# Patient Record
Sex: Female | Born: 1945 | Race: White | Hispanic: No | Marital: Married | Smoking: Never smoker
Health system: Southern US, Community
[De-identification: ages and names within clinical notes are randomized; demographics above are authoritative.]

## PROBLEM LIST (undated history)

## (undated) DIAGNOSIS — I10 Essential (primary) hypertension: Secondary | ICD-10-CM

## (undated) DIAGNOSIS — F419 Anxiety disorder, unspecified: Secondary | ICD-10-CM

## (undated) DIAGNOSIS — M179 Osteoarthritis of knee, unspecified: Secondary | ICD-10-CM

## (undated) DIAGNOSIS — G43109 Migraine with aura, not intractable, without status migrainosus: Secondary | ICD-10-CM

## (undated) DIAGNOSIS — E039 Hypothyroidism, unspecified: Secondary | ICD-10-CM

## (undated) DIAGNOSIS — N183 Chronic kidney disease, stage 3 unspecified: Secondary | ICD-10-CM

## (undated) DIAGNOSIS — K219 Gastro-esophageal reflux disease without esophagitis: Secondary | ICD-10-CM

## (undated) DIAGNOSIS — M719 Bursopathy, unspecified: Secondary | ICD-10-CM

## (undated) DIAGNOSIS — R001 Bradycardia, unspecified: Secondary | ICD-10-CM

## (undated) DIAGNOSIS — F32A Depression, unspecified: Secondary | ICD-10-CM

## (undated) DIAGNOSIS — E079 Disorder of thyroid, unspecified: Secondary | ICD-10-CM

## (undated) DIAGNOSIS — E782 Mixed hyperlipidemia: Secondary | ICD-10-CM

## (undated) DIAGNOSIS — K449 Diaphragmatic hernia without obstruction or gangrene: Secondary | ICD-10-CM

## (undated) DIAGNOSIS — T7840XA Allergy, unspecified, initial encounter: Secondary | ICD-10-CM

## (undated) DIAGNOSIS — K5790 Diverticulosis of intestine, part unspecified, without perforation or abscess without bleeding: Secondary | ICD-10-CM

## (undated) DIAGNOSIS — M1710 Unilateral primary osteoarthritis, unspecified knee: Secondary | ICD-10-CM

## (undated) HISTORY — DX: Diverticulosis of intestine, part unspecified, without perforation or abscess without bleeding: K57.90

## (undated) HISTORY — DX: Migraine with aura, not intractable, without status migrainosus: G43.109

## (undated) HISTORY — DX: Gastro-esophageal reflux disease without esophagitis: K21.9

## (undated) HISTORY — DX: Mixed hyperlipidemia: E78.2

## (undated) HISTORY — DX: Osteoarthritis of knee, unspecified: M17.9

## (undated) HISTORY — DX: Chronic kidney disease, stage 3 unspecified: N18.30

## (undated) HISTORY — PX: TONSILLECTOMY: SUR1361

## (undated) HISTORY — PX: CHOLECYSTECTOMY: SHX55

## (undated) HISTORY — DX: Disorder of thyroid, unspecified: E07.9

## (undated) HISTORY — PX: ESOPHAGEAL DILATION: SHX303

## (undated) HISTORY — DX: Allergy, unspecified, initial encounter: T78.40XA

## (undated) HISTORY — DX: Depression, unspecified: F32.A

## (undated) HISTORY — DX: Diaphragmatic hernia without obstruction or gangrene: K44.9

## (undated) HISTORY — DX: Anxiety disorder, unspecified: F41.9

## (undated) HISTORY — DX: Essential (primary) hypertension: I10

## (undated) HISTORY — DX: Unilateral primary osteoarthritis, unspecified knee: M17.10

## (undated) HISTORY — DX: Bursopathy, unspecified: M71.9

---

## 1973-05-16 HISTORY — PX: TUBAL LIGATION: SHX77

## 1998-05-16 DIAGNOSIS — D649 Anemia, unspecified: Secondary | ICD-10-CM

## 1998-05-16 HISTORY — DX: Anemia, unspecified: D64.9

## 2004-03-11 ENCOUNTER — Ambulatory Visit: Payer: Self-pay | Admitting: Family Medicine

## 2004-06-17 ENCOUNTER — Ambulatory Visit: Payer: Self-pay | Admitting: Gastroenterology

## 2005-08-15 ENCOUNTER — Ambulatory Visit: Payer: Self-pay | Admitting: Family Medicine

## 2005-08-23 ENCOUNTER — Ambulatory Visit: Payer: Self-pay | Admitting: Family Medicine

## 2006-06-26 ENCOUNTER — Ambulatory Visit: Payer: Self-pay | Admitting: Gastroenterology

## 2006-09-25 ENCOUNTER — Ambulatory Visit: Payer: Self-pay | Admitting: Family Medicine

## 2007-09-05 ENCOUNTER — Ambulatory Visit: Payer: Self-pay | Admitting: Family Medicine

## 2007-09-27 ENCOUNTER — Ambulatory Visit: Payer: Self-pay | Admitting: Family Medicine

## 2008-10-02 ENCOUNTER — Ambulatory Visit: Payer: Self-pay | Admitting: Family Medicine

## 2009-05-04 ENCOUNTER — Ambulatory Visit: Payer: Self-pay | Admitting: Family Medicine

## 2009-09-10 ENCOUNTER — Ambulatory Visit: Payer: Self-pay | Admitting: Family Medicine

## 2009-09-15 ENCOUNTER — Ambulatory Visit: Payer: Self-pay | Admitting: Gastroenterology

## 2011-05-03 ENCOUNTER — Ambulatory Visit: Payer: Self-pay | Admitting: Family Medicine

## 2011-05-26 ENCOUNTER — Ambulatory Visit: Payer: Self-pay | Admitting: Family Medicine

## 2011-06-03 ENCOUNTER — Ambulatory Visit: Payer: Self-pay | Admitting: Surgery

## 2011-06-03 LAB — COMPREHENSIVE METABOLIC PANEL
Albumin: 3.5 g/dL (ref 3.4–5.0)
Alkaline Phosphatase: 55 U/L (ref 50–136)
Anion Gap: 9 (ref 7–16)
BUN: 13 mg/dL (ref 7–18)
Bilirubin,Total: 0.4 mg/dL (ref 0.2–1.0)
Calcium, Total: 9 mg/dL (ref 8.5–10.1)
Chloride: 104 mmol/L (ref 98–107)
Co2: 29 mmol/L (ref 21–32)
Creatinine: 1.16 mg/dL (ref 0.60–1.30)
EGFR (African American): >60
EGFR (Non-African Amer.): 50 — ABNORMAL LOW
Glucose: 112 mg/dL — ABNORMAL HIGH (ref 65–99)
Osmolality: 284 (ref 275–301)
Potassium: 4.4 mmol/L (ref 3.5–5.1)
SGOT(AST): 18 U/L (ref 15–37)
SGPT (ALT): 19 U/L
Sodium: 142 mmol/L (ref 136–145)
Total Protein: 7.9 g/dL (ref 6.4–8.2)

## 2011-06-03 LAB — CBC WITH DIFFERENTIAL/PLATELET
Basophil #: 0 10*3/uL (ref 0.0–0.1)
Basophil %: 0.4 %
Eosinophil #: 0.1 10*3/uL (ref 0.0–0.7)
Eosinophil %: 1.8 %
HCT: 40.8 % (ref 35.0–47.0)
HGB: 13.6 g/dL (ref 12.0–16.0)
Lymphocyte #: 1.2 10*3/uL (ref 1.0–3.6)
Lymphocyte %: 21.4 %
MCH: 30.7 pg (ref 26.0–34.0)
MCHC: 33.2 g/dL (ref 32.0–36.0)
MCV: 92 fL (ref 80–100)
Monocyte #: 0.6 10*3/uL (ref 0.0–0.7)
Monocyte %: 10.4 %
Neutrophil #: 3.7 10*3/uL (ref 1.4–6.5)
Neutrophil %: 66 %
Platelet: 216 10*3/uL (ref 150–440)
RBC: 4.42 10*6/uL (ref 3.80–5.20)
RDW: 13.3 % (ref 11.5–14.5)
WBC: 5.6 10*3/uL (ref 3.6–11.0)

## 2011-06-10 ENCOUNTER — Ambulatory Visit: Payer: Self-pay | Admitting: Surgery

## 2011-06-13 LAB — PATHOLOGY REPORT

## 2011-12-19 ENCOUNTER — Ambulatory Visit: Payer: Self-pay | Admitting: Family Medicine

## 2011-12-29 ENCOUNTER — Ambulatory Visit: Payer: Self-pay | Admitting: Family Medicine

## 2012-12-25 ENCOUNTER — Ambulatory Visit: Payer: Self-pay | Admitting: Family Medicine

## 2013-08-21 DIAGNOSIS — B351 Tinea unguium: Secondary | ICD-10-CM | POA: Insufficient documentation

## 2013-09-19 DIAGNOSIS — E041 Nontoxic single thyroid nodule: Secondary | ICD-10-CM | POA: Insufficient documentation

## 2013-10-25 DIAGNOSIS — G459 Transient cerebral ischemic attack, unspecified: Secondary | ICD-10-CM

## 2013-10-25 DIAGNOSIS — H539 Unspecified visual disturbance: Secondary | ICD-10-CM | POA: Insufficient documentation

## 2013-10-25 DIAGNOSIS — R200 Anesthesia of skin: Secondary | ICD-10-CM | POA: Insufficient documentation

## 2013-10-25 DIAGNOSIS — R6889 Other general symptoms and signs: Secondary | ICD-10-CM

## 2013-10-25 DIAGNOSIS — R202 Paresthesia of skin: Secondary | ICD-10-CM | POA: Insufficient documentation

## 2013-10-25 DIAGNOSIS — R51 Headache: Secondary | ICD-10-CM

## 2013-10-25 DIAGNOSIS — R519 Headache, unspecified: Secondary | ICD-10-CM | POA: Insufficient documentation

## 2013-10-25 DIAGNOSIS — I10 Essential (primary) hypertension: Secondary | ICD-10-CM | POA: Insufficient documentation

## 2013-10-25 DIAGNOSIS — E669 Obesity, unspecified: Secondary | ICD-10-CM | POA: Insufficient documentation

## 2013-12-05 ENCOUNTER — Emergency Department: Payer: Self-pay | Admitting: Emergency Medicine

## 2013-12-05 LAB — BASIC METABOLIC PANEL
Anion Gap: 4 — ABNORMAL LOW (ref 7–16)
BUN: 10 mg/dL (ref 7–18)
Calcium, Total: 9.1 mg/dL (ref 8.5–10.1)
Chloride: 104 mmol/L (ref 98–107)
Co2: 30 mmol/L (ref 21–32)
Creatinine: 1.06 mg/dL (ref 0.60–1.30)
EGFR (African American): >60
EGFR (Non-African Amer.): 54 — ABNORMAL LOW
Glucose: 102 mg/dL — ABNORMAL HIGH (ref 65–99)
Osmolality: 275 (ref 275–301)
Potassium: 3.8 mmol/L (ref 3.5–5.1)
Sodium: 138 mmol/L (ref 136–145)

## 2013-12-05 LAB — CBC
HCT: 40.9 % (ref 35.0–47.0)
HGB: 13.5 g/dL (ref 12.0–16.0)
MCH: 31.2 pg (ref 26.0–34.0)
MCHC: 33 g/dL (ref 32.0–36.0)
MCV: 95 fL (ref 80–100)
Platelet: 219 10*3/uL (ref 150–440)
RBC: 4.33 10*6/uL (ref 3.80–5.20)
RDW: 13.8 % (ref 11.5–14.5)
WBC: 10.3 10*3/uL (ref 3.6–11.0)

## 2013-12-05 LAB — PRO B NATRIURETIC PEPTIDE: B-Type Natriuretic Peptide: 203 pg/mL — ABNORMAL HIGH (ref 0–125)

## 2013-12-05 LAB — TROPONIN I: Troponin-I: <0.02 ng/mL

## 2014-01-09 ENCOUNTER — Ambulatory Visit: Payer: Self-pay

## 2014-01-14 ENCOUNTER — Ambulatory Visit: Payer: Self-pay | Admitting: Family Medicine

## 2014-06-02 ENCOUNTER — Ambulatory Visit: Payer: Self-pay | Admitting: Gastroenterology

## 2014-06-02 HISTORY — PX: COLONOSCOPY: SHX174

## 2014-06-02 HISTORY — PX: UPPER GI ENDOSCOPY: SHX6162

## 2014-06-10 ENCOUNTER — Ambulatory Visit: Payer: Self-pay | Admitting: Gastroenterology

## 2014-06-26 ENCOUNTER — Ambulatory Visit: Payer: Self-pay | Admitting: Surgery

## 2014-07-09 ENCOUNTER — Ambulatory Visit: Admit: 2014-07-09 | Disposition: A | Discharge status: Home / Self Care | Payer: Self-pay | Admitting: Gastroenterology

## 2014-08-26 ENCOUNTER — Ambulatory Visit
Admit: 2014-08-26 | Disposition: A | Discharge status: Home / Self Care | Payer: Self-pay | Attending: Family Medicine | Admitting: Family Medicine

## 2014-08-28 DIAGNOSIS — K449 Diaphragmatic hernia without obstruction or gangrene: Secondary | ICD-10-CM | POA: Insufficient documentation

## 2014-08-28 DIAGNOSIS — E7800 Pure hypercholesterolemia, unspecified: Secondary | ICD-10-CM

## 2014-08-28 DIAGNOSIS — Z8673 Personal history of transient ischemic attack (TIA), and cerebral infarction without residual deficits: Secondary | ICD-10-CM | POA: Insufficient documentation

## 2014-08-28 DIAGNOSIS — E039 Hypothyroidism, unspecified: Secondary | ICD-10-CM | POA: Insufficient documentation

## 2014-08-28 HISTORY — PX: HERNIA REPAIR: SHX51

## 2014-09-07 NOTE — Op Note (Signed)
PATIENT NAME:  Pamela Hendrix, KLUGE MR#:  [id number] DATE OF BIRTH:  May 11, 1946  DATE OF PROCEDURE:  06/10/2011  OPERATION PERFORMED: Laparoscopic cholecystectomy.  PREOPERATIVE DIAGNOSIS: Symptomatic cholelithiasis.   POSTOPERATIVE DIAGNOSIS: Symptomatic cholelithiasis.  SURGEON: Consuela Mimes, MD   ANESTHESIA: General.   PROCEDURE IN DETAIL: The patient was placed supine on the operating room table and prepped and draped in the usual sterile fashion. A 15 mmHg CO2 pneumoperitoneum was created via a Veress needle in the infraumbilical position and this was replaced with a 5 mm trocar and a 30 degree angled laparoscope. Remaining trocars were placed under direct visualization. The fundus of the gallbladder was retracted superiorly and eventually the infundibulum and triangle of Calot were shrouded in fat. The infundibulum was dissected out and retracted laterally to open the triangle up and dissection within the triangle revealed a clear cystic artery which was doubly clipped and divided. This was a posterior cystic artery. Prior to that, a small anterior cystic artery had been doubly clipped and divided. The cystic duct was then seen joining the infundibulum of the gallbladder and a single clip was placed here and three other clips were placed more centrally (as one of the clips scissored) and the cystic duct was divided. The gallbladder was removed from the liver bed with electrocautery, placed in an EndoCatch bag, and extracted from the abdomen via the epigastric port. This port site fascia required some enlargement due to the volume of stones and the peritoneum was temporarily desufflated and the linea alba at the epigastric port site was closed with a running 0 PDS suture. The abdomen was reinsufflated and repeat inspection showed that hemostasis was excellent and there was no bile staining. The clips were all secure. The right upper quadrant was irrigated with warm normal saline. This was  suctioned clear. The peritoneum was desufflated finally and decannulated and all four skin sites were closed with subcuticular 5-0 Monocryl, Mastisol, and Steri-Strips. The patient tolerated the procedure well. There were no complications.  ____________________________ Consuela Mimes, MD wfm:[id number] D: 06/10/2011 10:59:16 ET T: 06/10/2011 11:20:04 ET JOB#: [id number] Consuela Mimes MD ELECTRONICALLY SIGNED 06/11/2011 10:55

## 2014-09-08 LAB — SURGICAL PATHOLOGY

## 2014-12-04 ENCOUNTER — Other Ambulatory Visit: Payer: Self-pay | Admitting: Family Medicine

## 2015-04-02 ENCOUNTER — Ambulatory Visit (INDEPENDENT_AMBULATORY_CARE_PROVIDER_SITE_OTHER): Payer: Medicare PPO

## 2015-04-02 DIAGNOSIS — Z23 Encounter for immunization: Secondary | ICD-10-CM | POA: Diagnosis not present

## 2015-05-21 DIAGNOSIS — E038 Other specified hypothyroidism: Secondary | ICD-10-CM | POA: Diagnosis not present

## 2015-05-21 DIAGNOSIS — E041 Nontoxic single thyroid nodule: Secondary | ICD-10-CM | POA: Diagnosis not present

## 2015-05-21 DIAGNOSIS — E063 Autoimmune thyroiditis: Secondary | ICD-10-CM | POA: Diagnosis not present

## 2015-06-11 ENCOUNTER — Other Ambulatory Visit: Payer: Self-pay

## 2015-06-19 ENCOUNTER — Encounter: Payer: Self-pay | Admitting: Family Medicine

## 2015-06-19 ENCOUNTER — Ambulatory Visit (INDEPENDENT_AMBULATORY_CARE_PROVIDER_SITE_OTHER): Payer: Medicare HMO | Admitting: Family Medicine

## 2015-06-19 VITALS — BP 110/80 | HR 60 | Ht 63.0 in | Wt 156.0 lb

## 2015-06-19 DIAGNOSIS — E039 Hypothyroidism, unspecified: Secondary | ICD-10-CM

## 2015-06-19 DIAGNOSIS — I679 Cerebrovascular disease, unspecified: Secondary | ICD-10-CM | POA: Diagnosis not present

## 2015-06-19 DIAGNOSIS — Z23 Encounter for immunization: Secondary | ICD-10-CM | POA: Diagnosis not present

## 2015-06-19 DIAGNOSIS — E7800 Pure hypercholesterolemia, unspecified: Secondary | ICD-10-CM

## 2015-06-19 DIAGNOSIS — J309 Allergic rhinitis, unspecified: Secondary | ICD-10-CM

## 2015-06-19 DIAGNOSIS — I10 Essential (primary) hypertension: Secondary | ICD-10-CM

## 2015-06-19 MED ORDER — LEVOTHYROXINE SODIUM 100 MCG PO TABS: 100.0000 ug | ORAL_TABLET | Freq: Every day | ORAL | Status: DC

## 2015-06-19 MED ORDER — FLUTICASONE PROPIONATE 50 MCG/ACT NA SUSP: 1.0000 | Freq: Every day | NASAL | Status: DC

## 2015-06-19 MED ORDER — LOVASTATIN 20 MG PO TABS: 20.0000 mg | ORAL_TABLET | Freq: Every day | ORAL | Status: DC

## 2015-06-19 MED ORDER — MONTELUKAST SODIUM 10 MG PO TABS: 10.0000 mg | ORAL_TABLET | Freq: Every day | ORAL | Status: DC

## 2015-06-19 NOTE — Progress Notes (Signed)
Name: Pamela Hendrix   MRN: [id number]    DOB: 1946-03-02   Date:06/19/2015       Progress Note  Subjective  Chief Complaint  Chief Complaint  Patient presents with  . Hyperlipidemia  . Hypothyroidism  . Allergic Rhinitis   . Asthma    Hyperlipidemia This is a chronic problem. The current episode started more than 1 year ago. The problem is controlled. Recent lipid tests were reviewed and are normal. She has no history of chronic renal disease, diabetes, hypothyroidism, liver disease, obesity or nephrotic syndrome. There are no known factors aggravating her hyperlipidemia. Pertinent negatives include no chest pain, focal sensory loss, focal weakness, leg pain, myalgias or shortness of breath. Current antihyperlipidemic treatment includes statins. The current treatment provides mild improvement of lipids. There are no compliance problems.  Risk factors for coronary artery disease include dyslipidemia and hypertension.  Asthma There is no chest tightness, cough, difficulty breathing, frequent throat clearing, hemoptysis, hoarse voice, shortness of breath, sputum production or wheezing. This is a chronic problem. The current episode started more than 1 year ago. The problem occurs intermittently. The problem has been waxing and waning. Associated symptoms include weight loss. Pertinent negatives include no appetite change, chest pain, dyspnea on exertion, ear congestion, ear pain, fever, headaches, heartburn, malaise/fatigue, myalgias, nasal congestion, orthopnea, postnasal drip, rhinorrhea, sneezing, sore throat, sweats or trouble swallowing. Her symptoms are aggravated by nothing. Her symptoms are alleviated by leukotriene antagonist. She reports moderate improvement on treatment. Her past medical history is significant for asthma and bronchitis. There is no history of bronchiectasis or COPD.  Thyroid Problem Presents for follow-up visit. Symptoms include cold intolerance, constipation, dry  skin, fatigue and weight loss. Patient reports no anxiety, depressed mood, diaphoresis, diarrhea, hair loss, heat intolerance, hoarse voice, palpitations or visual change. The symptoms have been stable. Past treatments include levothyroxine. The treatment provided moderate relief. Her past medical history is significant for hyperlipidemia. There is no history of atrial fibrillation, dementia, diabetes, Graves' ophthalmopathy or neuropathy.    No problem-specific assessment & plan notes found for this encounter.   Past Medical History  Diagnosis Date  . Hypertension   . Thyroid disease   . GERD (gastroesophageal reflux disease)   . Allergy     Past Surgical History  Procedure Laterality Date  . Esophageal dilation    . Cholecystectomy    . Tonsillectomy    . Colonoscopy  06/02/2014    cleared for 10 yrs- Dr Rayann Heman  . Upper gi endoscopy  06/02/2014    History reviewed. No pertinent family history.  Social History   Social History  . Marital Status: Married    Spouse Name: N/A  . Number of Children: N/A  . Years of Education: N/A   Occupational History  . Not on file.   Social History Main Topics  . Smoking status: Never Smoker   . Smokeless tobacco: Not on file  . Alcohol Use: No  . Drug Use: No  . Sexual Activity: No   Other Topics Concern  . Not on file   Social History Narrative  . No narrative on file    Allergies  Allergen Reactions  . Codeine Nausea Only     Review of Systems  Constitutional: Positive for weight loss and fatigue. Negative for fever, chills, malaise/fatigue, diaphoresis and appetite change.  HENT: Negative for ear discharge, ear pain, hoarse voice, postnasal drip, rhinorrhea, sneezing, sore throat and trouble swallowing.   Eyes: Negative for blurred vision.  Respiratory: Negative for cough, hemoptysis, sputum production, shortness of breath and wheezing.   Cardiovascular: Negative for chest pain, dyspnea on exertion, palpitations and leg  swelling.  Gastrointestinal: Positive for constipation. Negative for heartburn, nausea, abdominal pain, diarrhea, blood in stool and melena.  Genitourinary: Negative for dysuria, urgency, frequency and hematuria.  Musculoskeletal: Negative for myalgias, back pain, joint pain and neck pain.  Skin: Negative for rash.  Neurological: Negative for dizziness, tingling, sensory change, focal weakness and headaches.  Endo/Heme/Allergies: Positive for cold intolerance. Negative for environmental allergies, heat intolerance and polydipsia. Does not bruise/bleed easily.  Psychiatric/Behavioral: Negative for depression and suicidal ideas. The patient is not nervous/anxious and does not have insomnia.      Objective  Filed Vitals:   06/19/15 0906  BP: 110/80  Pulse: 60  Height: 5\' 3"  (1.6 m)  Weight: 156 lb (70.761 kg)    Physical Exam  Constitutional: She is well-developed, well-nourished, and in no distress. No distress.  HENT:  Head: Normocephalic and atraumatic.  Right Ear: External ear normal.  Left Ear: External ear normal.  Nose: Nose normal.  Mouth/Throat: Oropharynx is clear and moist.  Eyes: Conjunctivae and EOM are normal. Pupils are equal, round, and reactive to light. Right eye exhibits no discharge. Left eye exhibits no discharge.  Neck: Normal range of motion. Neck supple. No JVD present. No thyromegaly present.  Cardiovascular: Normal rate, regular rhythm, normal heart sounds and intact distal pulses.  Exam reveals no gallop and no friction rub.   No murmur heard. Pulmonary/Chest: Effort normal and breath sounds normal.  Abdominal: Soft. Bowel sounds are normal. She exhibits no mass. There is no tenderness. There is no guarding.  Musculoskeletal: Normal range of motion. She exhibits no edema.  Lymphadenopathy:    She has no cervical adenopathy.  Neurological: She is alert.  Skin: Skin is warm and dry. She is not diaphoretic.  Psychiatric: Mood and affect normal.  Nursing  note and vitals reviewed.     Assessment & Plan  Problem List Items Addressed This Visit      Cardiovascular and Mediastinum   BP (high blood pressure) - Primary   Relevant Medications   aspirin EC 81 MG tablet   lovastatin (MEVACOR) 20 MG tablet   Other Relevant Orders   Renal Function Panel     Endocrine   Adult hypothyroidism   Relevant Medications   levothyroxine (SYNTHROID, LEVOTHROID) 100 MCG tablet   Other Relevant Orders   TSH     Other   Hypercholesterolemia   Relevant Medications   aspirin EC 81 MG tablet   lovastatin (MEVACOR) 20 MG tablet   Other Relevant Orders   Lipid Profile    Other Visit Diagnoses    Cerebral vascular disease        Relevant Medications    aspirin EC 81 MG tablet    lovastatin (MEVACOR) 20 MG tablet    Allergic sinusitis        Relevant Medications    cetirizine (ZYRTEC) 10 MG tablet    fluticasone (FLONASE) 50 MCG/ACT nasal spray    Need for pneumococcal vaccination        Relevant Orders    Pneumococcal conjugate vaccine 13-valent (Completed)         Dr. Dusten Ellinwood [facility]  06/19/2015

## 2015-06-20 LAB — RENAL FUNCTION PANEL
Albumin: 4.1 g/dL (ref 3.6–4.8)
BUN/Creatinine Ratio: 11 (ref 11–26)
BUN: 11 mg/dL (ref 8–27)
CO2: 26 mmol/L (ref 18–29)
Calcium: 9.4 mg/dL (ref 8.7–10.3)
Chloride: 102 mmol/L (ref 96–106)
Creatinine, Ser: 1.02 mg/dL — ABNORMAL HIGH (ref 0.57–1.00)
GFR calc Af Amer: 65 mL/min/{1.73_m2} (ref >59)
GFR calc non Af Amer: 56 mL/min/{1.73_m2} — ABNORMAL LOW (ref >59)
Glucose: 90 mg/dL (ref 65–99)
Phosphorus: 4.4 mg/dL (ref 2.5–4.5)
Potassium: 4.7 mmol/L (ref 3.5–5.2)
Sodium: 144 mmol/L (ref 134–144)

## 2015-06-20 LAB — TSH: TSH: 0.47 u[IU]/mL (ref 0.450–4.500)

## 2015-06-20 LAB — LIPID PANEL
Chol/HDL Ratio: 2.5 ratio units (ref 0.0–4.4)
Cholesterol, Total: 138 mg/dL (ref 100–199)
HDL: 56 mg/dL (ref >39)
LDL Calculated: 57 mg/dL (ref 0–99)
Triglycerides: 126 mg/dL (ref 0–149)
VLDL Cholesterol Cal: 25 mg/dL (ref 5–40)

## 2015-07-09 DIAGNOSIS — E039 Hypothyroidism, unspecified: Secondary | ICD-10-CM | POA: Diagnosis not present

## 2015-07-09 DIAGNOSIS — E785 Hyperlipidemia, unspecified: Secondary | ICD-10-CM | POA: Diagnosis not present

## 2015-08-27 ENCOUNTER — Other Ambulatory Visit: Payer: Self-pay

## 2015-08-27 MED ORDER — LOVASTATIN 20 MG PO TABS: 20.0000 mg | ORAL_TABLET | Freq: Every day | ORAL | Status: DC

## 2015-09-07 ENCOUNTER — Ambulatory Visit (INDEPENDENT_AMBULATORY_CARE_PROVIDER_SITE_OTHER): Payer: Medicare HMO | Admitting: Family Medicine

## 2015-09-07 ENCOUNTER — Encounter: Payer: Self-pay | Admitting: Family Medicine

## 2015-09-07 VITALS — BP 130/70 | HR 78 | Ht 63.0 in | Wt 156.0 lb

## 2015-09-07 DIAGNOSIS — E785 Hyperlipidemia, unspecified: Secondary | ICD-10-CM | POA: Diagnosis not present

## 2015-09-07 DIAGNOSIS — Z1239 Encounter for other screening for malignant neoplasm of breast: Secondary | ICD-10-CM | POA: Diagnosis not present

## 2015-09-07 DIAGNOSIS — Z0000 Encounter for general adult medical examination without abnormal findings: Secondary | ICD-10-CM | POA: Diagnosis not present

## 2015-09-07 DIAGNOSIS — E039 Hypothyroidism, unspecified: Secondary | ICD-10-CM

## 2015-09-07 DIAGNOSIS — Z13820 Encounter for screening for osteoporosis: Secondary | ICD-10-CM

## 2015-09-07 LAB — HEMOCCULT GUIAC POC 1CARD (OFFICE): Fecal Occult Blood, POC: NEGATIVE

## 2015-09-07 LAB — POCT URINALYSIS DIPSTICK
Bilirubin, UA: NEGATIVE
Blood, UA: NEGATIVE
Glucose, UA: NEGATIVE
Ketones, UA: NEGATIVE
Leukocytes, UA: NEGATIVE
Nitrite, UA: NEGATIVE
Protein, UA: NEGATIVE
Spec Grav, UA: 1.02
Urobilinogen, UA: 0.2
pH, UA: 5

## 2015-09-07 NOTE — Progress Notes (Signed)
Name: Pamela Hendrix   MRN: [id number]    DOB: Sep 23, 1945   Date:09/07/2015       Progress Note  Subjective  Chief Complaint  Chief Complaint  Patient presents with  . Annual Exam    mammo ordered and bone density    HPI Comments: Patient presents for annual physical exam.   No problem-specific assessment & plan notes found for this encounter.   Past Medical History  Diagnosis Date  . Hypertension   . Thyroid disease   . GERD (gastroesophageal reflux disease)   . Allergy     Past Surgical History  Procedure Laterality Date  . Esophageal dilation    . Cholecystectomy    . Tonsillectomy    . Colonoscopy  06/02/2014    cleared for 10 yrs- Dr Rayann Heman  . Upper gi endoscopy  06/02/2014    History reviewed. No pertinent family history.  Social History   Social History  . Marital Status: Married    Spouse Name: N/A  . Number of Children: N/A  . Years of Education: N/A   Occupational History  . Not on file.   Social History Main Topics  . Smoking status: Never Smoker   . Smokeless tobacco: Not on file  . Alcohol Use: No  . Drug Use: No  . Sexual Activity: No   Other Topics Concern  . Not on file   Social History Narrative    Allergies  Allergen Reactions  . Codeine Nausea Only     Review of Systems  Constitutional: Negative for fever, chills, weight loss and malaise/fatigue.  HENT: Negative for ear discharge, ear pain and sore throat.   Eyes: Positive for redness. Negative for blurred vision.  Respiratory: Negative for cough, sputum production, shortness of breath and wheezing.   Cardiovascular: Negative for chest pain, palpitations and leg swelling.  Gastrointestinal: Negative for heartburn, nausea, abdominal pain, diarrhea, constipation, blood in stool and melena.       Mild dysphagia/ followed by gastroenterology  Genitourinary: Negative for dysuria, urgency, frequency and hematuria.  Musculoskeletal: Negative for myalgias, back pain, joint  pain and neck pain.  Skin: Negative for rash.  Neurological: Negative for dizziness, tingling, sensory change, focal weakness and headaches.  Endo/Heme/Allergies: Negative for environmental allergies and polydipsia. Does not bruise/bleed easily.  Psychiatric/Behavioral: Negative for depression and suicidal ideas. The patient is not nervous/anxious and does not have insomnia.      Objective  Filed Vitals:   09/07/15 0833  BP: 130/70  Pulse: 78  Height: 5\' 3"  (1.6 m)  Weight: 156 lb (70.761 kg)    Physical Exam  Constitutional: She is well-developed, well-nourished, and in no distress. No distress.  HENT:  Head: Normocephalic and atraumatic.  Right Ear: External ear normal.  Left Ear: External ear normal.  Nose: Nose normal.  Mouth/Throat: Oropharynx is clear and moist.  Eyes: Conjunctivae and EOM are normal. Pupils are equal, round, and reactive to light. Right eye exhibits no discharge. Left eye exhibits no discharge.  Neck: Normal range of motion. Neck supple. No JVD present. No thyromegaly present.  Cardiovascular: Normal rate, regular rhythm, normal heart sounds and intact distal pulses.  Exam reveals no gallop and no friction rub.   No murmur heard. Pulmonary/Chest: Effort normal and breath sounds normal.  Abdominal: Soft. Bowel sounds are normal. She exhibits no mass. There is no tenderness. There is no guarding.  Musculoskeletal: Normal range of motion. She exhibits no edema.  Lymphadenopathy:    She has no  cervical adenopathy.  Neurological: She is alert. She has normal reflexes.  Skin: Skin is warm and dry. She is not diaphoretic.  Psychiatric: Mood and affect normal.  Nursing note and vitals reviewed.     Assessment & Plan  Problem List Items Addressed This Visit    None    Visit Diagnoses    Annual physical exam    -  Primary    Relevant Orders    MM Digital Screening    DG Bone Density    Renal Function Panel    POCT Occult Blood Stool    POCT  urinalysis dipstick    Breast cancer screening        Relevant Orders    MM Digital Screening    Osteoporosis screening        Relevant Orders    DG Bone Density    Hyperlipidemia        Relevant Orders    Lipid Profile         Dr. Otilio Miu [facility]  09/07/2015

## 2015-09-24 ENCOUNTER — Ambulatory Visit
Admission: RE | Admit: 2015-09-24 | Discharge: 2015-09-24 | Disposition: A | Discharge status: Home / Self Care | Payer: Medicare HMO | Source: Ambulatory Visit | Attending: Family Medicine | Admitting: Family Medicine

## 2015-09-24 ENCOUNTER — Other Ambulatory Visit: Payer: Self-pay | Admitting: Family Medicine

## 2015-09-24 DIAGNOSIS — Z1231 Encounter for screening mammogram for malignant neoplasm of breast: Secondary | ICD-10-CM | POA: Diagnosis not present

## 2015-09-24 DIAGNOSIS — Z1239 Encounter for other screening for malignant neoplasm of breast: Secondary | ICD-10-CM

## 2015-09-24 DIAGNOSIS — Z13820 Encounter for screening for osteoporosis: Secondary | ICD-10-CM | POA: Diagnosis not present

## 2015-09-24 DIAGNOSIS — Z0000 Encounter for general adult medical examination without abnormal findings: Secondary | ICD-10-CM

## 2015-09-24 DIAGNOSIS — M8580 Other specified disorders of bone density and structure, unspecified site: Secondary | ICD-10-CM | POA: Diagnosis not present

## 2015-09-24 DIAGNOSIS — M8588 Other specified disorders of bone density and structure, other site: Secondary | ICD-10-CM | POA: Diagnosis not present

## 2015-09-25 ENCOUNTER — Other Ambulatory Visit: Payer: Self-pay | Admitting: Family Medicine

## 2015-09-25 DIAGNOSIS — Z0000 Encounter for general adult medical examination without abnormal findings: Secondary | ICD-10-CM | POA: Diagnosis not present

## 2015-09-25 DIAGNOSIS — E785 Hyperlipidemia, unspecified: Secondary | ICD-10-CM | POA: Diagnosis not present

## 2015-09-26 LAB — RENAL FUNCTION PANEL
Albumin: 4.1 g/dL (ref 3.5–4.8)
BUN/Creatinine Ratio: 11 — ABNORMAL LOW (ref 12–28)
BUN: 11 mg/dL (ref 8–27)
CO2: 25 mmol/L (ref 18–29)
Calcium: 9 mg/dL (ref 8.7–10.3)
Chloride: 102 mmol/L (ref 96–106)
Creatinine, Ser: 1.01 mg/dL — ABNORMAL HIGH (ref 0.57–1.00)
GFR calc Af Amer: 65 mL/min/{1.73_m2} (ref >59)
GFR calc non Af Amer: 57 mL/min/{1.73_m2} — ABNORMAL LOW (ref >59)
Glucose: 76 mg/dL (ref 65–99)
Phosphorus: 3.9 mg/dL (ref 2.5–4.5)
Potassium: 4.6 mmol/L (ref 3.5–5.2)
Sodium: 141 mmol/L (ref 134–144)

## 2015-09-26 LAB — LIPID PANEL
Chol/HDL Ratio: 2.6 ratio units (ref 0.0–4.4)
Cholesterol, Total: 152 mg/dL (ref 100–199)
HDL: 59 mg/dL (ref >39)
LDL Calculated: 69 mg/dL (ref 0–99)
Triglycerides: 122 mg/dL (ref 0–149)
VLDL Cholesterol Cal: 24 mg/dL (ref 5–40)

## 2015-09-26 LAB — TSH: TSH: 1.38 u[IU]/mL (ref 0.450–4.500)

## 2015-12-18 ENCOUNTER — Other Ambulatory Visit: Payer: Self-pay | Admitting: Family Medicine

## 2015-12-18 DIAGNOSIS — E039 Hypothyroidism, unspecified: Secondary | ICD-10-CM

## 2016-02-15 DIAGNOSIS — R69 Illness, unspecified: Secondary | ICD-10-CM | POA: Diagnosis not present

## 2016-03-02 DIAGNOSIS — R69 Illness, unspecified: Secondary | ICD-10-CM | POA: Diagnosis not present

## 2016-03-02 DIAGNOSIS — G43109 Migraine with aura, not intractable, without status migrainosus: Secondary | ICD-10-CM | POA: Diagnosis not present

## 2016-03-02 DIAGNOSIS — H53123 Transient visual loss, bilateral: Secondary | ICD-10-CM | POA: Diagnosis not present

## 2016-03-04 ENCOUNTER — Other Ambulatory Visit: Payer: Self-pay | Admitting: Neurology

## 2016-03-04 DIAGNOSIS — R410 Disorientation, unspecified: Secondary | ICD-10-CM

## 2016-03-14 ENCOUNTER — Other Ambulatory Visit: Payer: Self-pay | Admitting: Family Medicine

## 2016-03-14 DIAGNOSIS — E039 Hypothyroidism, unspecified: Secondary | ICD-10-CM

## 2016-03-17 ENCOUNTER — Other Ambulatory Visit
Admission: RE | Admit: 2016-03-17 | Discharge: 2016-03-17 | Disposition: A | Discharge status: Home / Self Care | Payer: Medicare HMO | Source: Ambulatory Visit | Attending: Neurology | Admitting: Neurology

## 2016-03-17 ENCOUNTER — Ambulatory Visit
Admission: RE | Admit: 2016-03-17 | Discharge: 2016-03-17 | Disposition: A | Discharge status: Home / Self Care | Payer: Medicare HMO | Source: Ambulatory Visit | Attending: Neurology | Admitting: Neurology

## 2016-03-17 DIAGNOSIS — R410 Disorientation, unspecified: Secondary | ICD-10-CM | POA: Diagnosis not present

## 2016-03-17 DIAGNOSIS — R51 Headache: Secondary | ICD-10-CM | POA: Diagnosis not present

## 2016-03-17 DIAGNOSIS — H53123 Transient visual loss, bilateral: Secondary | ICD-10-CM | POA: Insufficient documentation

## 2016-03-17 DIAGNOSIS — F99 Mental disorder, not otherwise specified: Secondary | ICD-10-CM | POA: Insufficient documentation

## 2016-03-17 LAB — CREATININE, SERUM
Creatinine, Ser: 1.15 mg/dL — ABNORMAL HIGH (ref 0.44–1.00)
GFR calc Af Amer: 55 mL/min — ABNORMAL LOW (ref >60)
GFR calc non Af Amer: 47 mL/min — ABNORMAL LOW (ref >60)

## 2016-03-17 MED ORDER — GADOBENATE DIMEGLUMINE 529 MG/ML IV SOLN
15.0000 mL | Freq: Once | INTRAVENOUS | Status: AC | PRN
  Administered 2016-03-17: 14 mL via INTRAVENOUS

## 2016-03-18 DIAGNOSIS — E041 Nontoxic single thyroid nodule: Secondary | ICD-10-CM | POA: Diagnosis not present

## 2016-03-18 DIAGNOSIS — I6523 Occlusion and stenosis of bilateral carotid arteries: Secondary | ICD-10-CM | POA: Diagnosis not present

## 2016-04-01 DIAGNOSIS — R569 Unspecified convulsions: Secondary | ICD-10-CM | POA: Diagnosis not present

## 2016-04-02 DIAGNOSIS — R569 Unspecified convulsions: Secondary | ICD-10-CM | POA: Diagnosis not present

## 2016-04-03 DIAGNOSIS — R569 Unspecified convulsions: Secondary | ICD-10-CM | POA: Diagnosis not present

## 2016-05-24 DIAGNOSIS — E041 Nontoxic single thyroid nodule: Secondary | ICD-10-CM | POA: Diagnosis not present

## 2016-05-24 DIAGNOSIS — E063 Autoimmune thyroiditis: Secondary | ICD-10-CM | POA: Diagnosis not present

## 2016-05-24 DIAGNOSIS — E038 Other specified hypothyroidism: Secondary | ICD-10-CM | POA: Diagnosis not present

## 2016-06-03 DIAGNOSIS — G43109 Migraine with aura, not intractable, without status migrainosus: Secondary | ICD-10-CM | POA: Diagnosis not present

## 2016-06-16 ENCOUNTER — Other Ambulatory Visit: Payer: Self-pay | Admitting: Family Medicine

## 2016-06-16 DIAGNOSIS — E039 Hypothyroidism, unspecified: Secondary | ICD-10-CM

## 2016-06-28 ENCOUNTER — Ambulatory Visit (INDEPENDENT_AMBULATORY_CARE_PROVIDER_SITE_OTHER): Payer: Medicare HMO | Admitting: Family Medicine

## 2016-06-28 VITALS — BP 120/80 | HR 60 | Ht 63.0 in | Wt 160.0 lb

## 2016-06-28 DIAGNOSIS — E782 Mixed hyperlipidemia: Secondary | ICD-10-CM | POA: Diagnosis not present

## 2016-06-28 DIAGNOSIS — E039 Hypothyroidism, unspecified: Secondary | ICD-10-CM | POA: Diagnosis not present

## 2016-06-28 DIAGNOSIS — K449 Diaphragmatic hernia without obstruction or gangrene: Secondary | ICD-10-CM

## 2016-06-28 DIAGNOSIS — J309 Allergic rhinitis, unspecified: Secondary | ICD-10-CM | POA: Diagnosis not present

## 2016-06-28 DIAGNOSIS — Z1159 Encounter for screening for other viral diseases: Secondary | ICD-10-CM | POA: Diagnosis not present

## 2016-06-28 MED ORDER — FLUTICASONE PROPIONATE 50 MCG/ACT NA SUSP: 1.0000 | Freq: Every day | NASAL | 11 refills | Status: DC

## 2016-06-28 MED ORDER — LOVASTATIN 20 MG PO TABS: 20.0000 mg | ORAL_TABLET | Freq: Every day | ORAL | 1 refills | Status: DC

## 2016-06-28 MED ORDER — LEVOTHYROXINE SODIUM 100 MCG PO TABS: 100.0000 ug | ORAL_TABLET | Freq: Every day | ORAL | 3 refills | Status: DC

## 2016-06-28 MED ORDER — MONTELUKAST SODIUM 10 MG PO TABS: 10.0000 mg | ORAL_TABLET | Freq: Every day | ORAL | 11 refills | Status: DC

## 2016-06-28 NOTE — Progress Notes (Signed)
Name: Pamela Hendrix   MRN: [id number]    DOB: 1946/04/10   Date:06/28/2016       Progress Note  Subjective  Chief Complaint  Chief Complaint  Patient presents with  . Allergic Rhinitis   . Hypothyroidism  . Hyperlipidemia    Patient had exposure to hepatitis c in past/ gave blood in seventies /unsure if screened   Hyperlipidemia  This is a chronic problem. The current episode started more than 1 year ago. The problem is controlled. Recent lipid tests were reviewed and are normal. She has no history of chronic renal disease, diabetes, hypothyroidism, liver disease, obesity or nephrotic syndrome. There are no known factors aggravating her hyperlipidemia. Pertinent negatives include no chest pain, focal sensory loss, focal weakness, leg pain, myalgias or shortness of breath. Current antihyperlipidemic treatment includes statins. The current treatment provides mild improvement of lipids. There are no compliance problems.  Risk factors for coronary artery disease include dyslipidemia.  Thyroid Problem  Presents for follow-up visit. Symptoms include cold intolerance, constipation and fatigue. Patient reports no anxiety, depressed mood, diaphoresis, diarrhea, dry skin, hair loss, heat intolerance, hoarse voice, leg swelling, menstrual problem, nail problem, palpitations, tremors, visual change, weight gain or weight loss. The symptoms have been stable. Her past medical history is significant for hyperlipidemia. There is no history of diabetes.  Allergic Reaction  This is a chronic problem. The problem occurs intermittently. The problem is mild. Associated symptoms include abdominal pain, coughing, eye itching and eye watering. Pertinent negatives include no chest pain, chest pressure, diarrhea, difficulty breathing, drooling, eye redness, globus sensation, hyperventilation, itching, rash, stridor, trouble swallowing, vomiting or wheezing. There is no swelling present. Past treatments include  nothing (singulair/flonase/zyrtec). The treatment provided mild relief. Her past medical history is significant for seasonal allergies. There is no history of asthma, atopic dermatitis, food allergies or medication allergies.  Gastroesophageal Reflux  She complains of abdominal pain, coughing and heartburn. She reports no chest pain, no choking, no dysphagia, no globus sensation, no hoarse voice, no nausea, no sore throat or no wheezing. This is a chronic problem. The problem occurs constantly. The problem has been waxing and waning. The symptoms are aggravated by certain foods. Associated symptoms include fatigue. Pertinent negatives include no melena or weight loss. She has tried nothing for the symptoms. Past procedures include a UGI. "large hiatal hernia".    No problem-specific Assessment & Plan notes found for this encounter.   Past Medical History:  Diagnosis Date  . Allergy   . GERD (gastroesophageal reflux disease)   . Hypertension   . Thyroid disease     Past Surgical History:  Procedure Laterality Date  . CHOLECYSTECTOMY    . COLONOSCOPY  06/02/2014   cleared for 10 yrs- Dr Rayann Heman  . ESOPHAGEAL DILATION    . TONSILLECTOMY    . UPPER GI ENDOSCOPY  06/02/2014    No family history on file.  Social History   Social History  . Marital status: Married    Spouse name: N/A  . Number of children: N/A  . Years of education: N/A   Occupational History  . Not on file.   Social History Main Topics  . Smoking status: Never Smoker  . Smokeless tobacco: Not on file  . Alcohol use No  . Drug use: No  . Sexual activity: No   Other Topics Concern  . Not on file   Social History Narrative  . No narrative on file    Allergies  Allergen  Reactions  . Codeine Nausea Only     Review of Systems  Constitutional: Positive for fatigue. Negative for chills, diaphoresis, fever, malaise/fatigue, weight gain and weight loss.  HENT: Negative for drooling, ear discharge, ear pain,  hoarse voice, sore throat and trouble swallowing.   Eyes: Positive for itching. Negative for blurred vision and redness.  Respiratory: Positive for cough. Negative for sputum production, choking, shortness of breath, wheezing and stridor.   Cardiovascular: Negative for chest pain, palpitations and leg swelling.  Gastrointestinal: Positive for abdominal pain, constipation and heartburn. Negative for blood in stool, diarrhea, dysphagia, melena, nausea and vomiting.  Genitourinary: Negative for dysuria, flank pain, frequency, hematuria, menstrual problem and urgency.  Musculoskeletal: Negative for back pain, falls, joint pain, myalgias and neck pain.  Skin: Negative for itching and rash.  Neurological: Negative for dizziness, tingling, tremors, sensory change, focal weakness and headaches.  Endo/Heme/Allergies: Positive for cold intolerance. Negative for environmental allergies, heat intolerance and polydipsia. Does not bruise/bleed easily.  Psychiatric/Behavioral: Negative for depression and suicidal ideas. The patient is not nervous/anxious and does not have insomnia.      Objective  Vitals:   06/28/16 0910  BP: 120/80  Pulse: 60  Weight: 160 lb (72.6 kg)  Height: 5\' 3"  (1.6 m)    Physical Exam  Constitutional: She is well-developed, well-nourished, and in no distress. No distress.  HENT:  Head: Normocephalic and atraumatic.  Right Ear: External ear normal.  Left Ear: External ear normal.  Nose: Nose normal.  Mouth/Throat: Oropharynx is clear and moist.  Eyes: Conjunctivae and EOM are normal. Pupils are equal, round, and reactive to light. Right eye exhibits no discharge. Left eye exhibits no discharge.  Neck: Normal range of motion. Neck supple. No JVD present. No thyromegaly present.  Cardiovascular: Normal rate, regular rhythm, normal heart sounds and intact distal pulses.  Exam reveals no gallop and no friction rub.   No murmur heard. Pulmonary/Chest: Effort normal and breath  sounds normal. She has no wheezes. She has no rales.  Abdominal: Soft. Bowel sounds are normal. She exhibits no mass. There is no tenderness. There is no rebound and no guarding.  Musculoskeletal: Normal range of motion. She exhibits no edema or tenderness.  Lymphadenopathy:    She has no cervical adenopathy.  Neurological: She is alert. She has normal reflexes.  Skin: Skin is warm and dry. She is not diaphoretic.  Psychiatric: Mood and affect normal.  Nursing note and vitals reviewed.     Assessment & Plan  Problem List Items Addressed This Visit      Respiratory   Chronic allergic rhinitis   Relevant Medications   montelukast (SINGULAIR) 10 MG tablet   fluticasone (FLONASE) 50 MCG/ACT nasal spray     Endocrine   Hypothyroidism - Primary   Relevant Medications   levothyroxine (SYNTHROID, LEVOTHROID) 100 MCG tablet   Other Relevant Orders   TSH     Other   Mixed hyperlipidemia   Relevant Medications   lovastatin (MEVACOR) 20 MG tablet   Other Relevant Orders   Lipid Profile    Other Visit Diagnoses    Need for hepatitis C screening test       Relevant Orders   Hepatitis C antibody   Hiatal hernia            Dr. Slayde Brault [facility]  06/28/16

## 2016-06-29 LAB — LIPID PANEL
Chol/HDL Ratio: 2.5 ratio units (ref 0.0–4.4)
Cholesterol, Total: 154 mg/dL (ref 100–199)
HDL: 62 mg/dL (ref >39)
LDL Calculated: 71 mg/dL (ref 0–99)
Triglycerides: 103 mg/dL (ref 0–149)
VLDL Cholesterol Cal: 21 mg/dL (ref 5–40)

## 2016-06-29 LAB — TSH: TSH: 0.724 u[IU]/mL (ref 0.450–4.500)

## 2016-06-29 LAB — HEPATITIS C ANTIBODY: Hep C Virus Ab: <0.1 s/co ratio (ref 0.0–0.9)

## 2016-06-30 ENCOUNTER — Other Ambulatory Visit: Payer: Self-pay

## 2016-09-07 ENCOUNTER — Encounter: Payer: Self-pay | Admitting: Family Medicine

## 2016-09-07 ENCOUNTER — Ambulatory Visit (INDEPENDENT_AMBULATORY_CARE_PROVIDER_SITE_OTHER): Payer: Medicare HMO | Admitting: Family Medicine

## 2016-09-07 VITALS — BP 120/70 | HR 72 | Ht 63.0 in | Wt 161.0 lb

## 2016-09-07 DIAGNOSIS — R69 Illness, unspecified: Secondary | ICD-10-CM | POA: Diagnosis not present

## 2016-09-07 DIAGNOSIS — Z0000 Encounter for general adult medical examination without abnormal findings: Secondary | ICD-10-CM

## 2016-09-07 MED ORDER — MONTELUKAST SODIUM 10 MG PO TABS: 10.0000 mg | ORAL_TABLET | Freq: Every day | ORAL | 1 refills | Status: DC

## 2016-09-07 NOTE — Progress Notes (Signed)
Name: Pamela Hendrix   MRN: [id number]    DOB: [birth year]   Date:09/07/2016       Progress Note  Subjective  Chief Complaint  Chief Complaint  Patient presents with  . Annual Exam    no pap, needs mammo and renal panel    Patient presents for annual physical exam.      No problem-specific Assessment & Plan notes found for this encounter.   Past Medical History:  Diagnosis Date  . Allergy   . GERD (gastroesophageal reflux disease)   . Hypertension   . Thyroid disease     Past Surgical History:  Procedure Laterality Date  . CHOLECYSTECTOMY    . COLONOSCOPY  06/02/2014   cleared for 10 yrs- Dr Rayann Heman  . ESOPHAGEAL DILATION    . TONSILLECTOMY    . UPPER GI ENDOSCOPY  06/02/2014    No family history on file.  Social History   Social History  . Marital status: Married    Spouse name: N/A  . Number of children: N/A  . Years of education: N/A   Occupational History  . Not on file.   Social History Main Topics  . Smoking status: Never Smoker  . Smokeless tobacco: Never Used  . Alcohol use No  . Drug use: No  . Sexual activity: No   Other Topics Concern  . Not on file   Social History Narrative  . No narrative on file    Allergies  Allergen Reactions  . Codeine Nausea Only    Outpatient Medications Prior to Visit  Medication Sig Dispense Refill  . aspirin EC 81 MG tablet Take 81 mg by mouth daily.    Marland Kitchen CALCIUM-MAGNESIUM-VITAMIN D PO Take 2 each by mouth daily. otc- 2 tablespoons daily    . cetirizine (ZYRTEC) 10 MG tablet Take 10 mg by mouth daily. otc    . fluticasone (FLONASE) 50 MCG/ACT nasal spray Place 1 spray into both nostrils daily. 16 g 11  . levothyroxine (SYNTHROID, LEVOTHROID) 100 MCG tablet Take 1 tablet (100 mcg total) by mouth daily. 90 tablet 3  . lovastatin (MEVACOR) 20 MG tablet Take 1 tablet (20 mg total) by mouth daily. 90 tablet 1  . simethicone (GAS-X EXTRA STRENGTH) 125 MG chewable tablet Chew 125 mg by mouth daily.     . montelukast (SINGULAIR) 10 MG tablet Take 1 tablet (10 mg total) by mouth daily. 30 tablet 11  . polyethylene glycol (MIRALAX / GLYCOLAX) packet Take 1 packet by mouth daily. Pt takes 1 teaspoon daily     No facility-administered medications prior to visit.     Review of Systems  Constitutional: Negative.   HENT: Positive for congestion. Negative for ear discharge, ear pain, hearing loss, nosebleeds, sinus pain and tinnitus.   Eyes: Positive for blurred vision. Negative for double vision, photophobia, pain, discharge and redness.  Respiratory: Negative for cough, hemoptysis, sputum production, shortness of breath, wheezing and stridor.   Cardiovascular: Negative for chest pain, palpitations, orthopnea, claudication, leg swelling and PND.  Gastrointestinal: Negative for abdominal pain, blood in stool, constipation, diarrhea, heartburn, melena, nausea and vomiting.       Dysphagia/ hiccups ice water  Genitourinary: Negative.   Musculoskeletal: Positive for joint pain and myalgias. Negative for back pain, falls and neck pain.  Skin: Negative.        "few little spot"  Neurological: Positive for headaches. Negative for dizziness, tremors, sensory change, speech change, focal weakness, seizures and loss of consciousness.  Mild  Endo/Heme/Allergies: Negative for environmental allergies and polydipsia. Bruises/bleeds easily.       Asa  Psychiatric/Behavioral: Negative for depression, hallucinations, memory loss, substance abuse and suicidal ideas. The patient is not nervous/anxious and does not have insomnia.        Dancercise/PHQ 2 neg     Objective  Vitals:   09/07/16 0828  BP: 120/70  Pulse: 72  Weight: 161 lb (73 kg)  Height: 5\' 3"  (1.6 m)    Physical Exam  Constitutional: She is oriented to person, place, and time and well-developed, well-nourished, and in no distress. No distress.  HENT:  Head: Normocephalic and atraumatic.  Right Ear: Tympanic membrane, external ear  and ear canal normal.  Left Ear: Tympanic membrane, external ear and ear canal normal.  Nose: Nose normal.  Mouth/Throat: Uvula is midline, oropharynx is clear and moist and mucous membranes are normal.  Eyes: Conjunctivae, EOM and lids are normal. Pupils are equal, round, and reactive to light. Right eye exhibits no discharge. Left eye exhibits no discharge.  Fundoscopic exam:      The right eye shows no arteriolar narrowing and no AV nicking.       The left eye shows no arteriolar narrowing and no AV nicking.  Neck: Trachea normal and normal range of motion. Neck supple. Normal carotid pulses, no hepatojugular reflux and no JVD present. No spinous process tenderness present. Carotid bruit is not present. Normal range of motion present. No thyromegaly present.  Cardiovascular: Normal rate, regular rhythm, S1 normal, S2 normal, normal heart sounds and intact distal pulses.  Exam reveals no gallop, no S3, no S4 and no friction rub.   No murmur heard. Pulmonary/Chest: Effort normal. No accessory muscle usage. No respiratory distress. She has no decreased breath sounds. She has no wheezes. She has no rhonchi. She has no rales. Right breast exhibits no inverted nipple, no mass, no nipple discharge, no skin change and no tenderness. Left breast exhibits no inverted nipple, no mass, no nipple discharge, no skin change and no tenderness. Breasts are symmetrical.  Abdominal: Soft. Bowel sounds are normal. She exhibits no mass. There is no hepatosplenomegaly. There is no tenderness. There is no guarding and no CVA tenderness.  Genitourinary: Rectal exam shows external hemorrhoid and internal hemorrhoid. Rectal exam shows no mass, no tenderness and guaiac negative stool.  Musculoskeletal: Normal range of motion. She exhibits no edema.       Cervical back: Normal.       Thoracic back: Normal.       Lumbar back: Normal.  Lymphadenopathy:       Head (right side): No submental and no submandibular adenopathy  present.       Head (left side): No submental and no submandibular adenopathy present.    She has no cervical adenopathy.    She has no axillary adenopathy.  Neurological: She is alert and oriented to person, place, and time. She has normal sensation, normal strength, normal reflexes and intact cranial nerves.  Skin: Skin is warm, dry and intact. She is not diaphoretic.  Psychiatric: Mood, memory, affect and judgment normal.  Nursing note and vitals reviewed.     Assessment & Plan  Problem List Items Addressed This Visit    None    Visit Diagnoses    Annual physical exam    -  Primary   Relevant Orders   MM Digital Screening   Taking medication for chronic disease       Relevant Orders  Renal Function Panel      Meds ordered this encounter  Medications  . montelukast (SINGULAIR) 10 MG tablet    Sig: Take 1 tablet (10 mg total) by mouth daily.    Dispense:  90 tablet    Refill:  1    Needs appt      Dr. Otilio Miu [facility]  09/07/16

## 2016-09-08 ENCOUNTER — Other Ambulatory Visit: Payer: Self-pay

## 2016-09-08 LAB — RENAL FUNCTION PANEL
Albumin: 4.3 g/dL (ref 3.5–4.8)
BUN/Creatinine Ratio: 15 (ref 12–28)
BUN: 14 mg/dL (ref 8–27)
CO2: 26 mmol/L (ref 18–29)
Calcium: 9.4 mg/dL (ref 8.7–10.3)
Chloride: 101 mmol/L (ref 96–106)
Creatinine, Ser: 0.92 mg/dL (ref 0.57–1.00)
GFR calc Af Amer: 73 mL/min/{1.73_m2} (ref >59)
GFR calc non Af Amer: 63 mL/min/{1.73_m2} (ref >59)
Glucose: 93 mg/dL (ref 65–99)
Phosphorus: 3.7 mg/dL (ref 2.5–4.5)
Potassium: 4.7 mmol/L (ref 3.5–5.2)
Sodium: 141 mmol/L (ref 134–144)

## 2016-09-26 ENCOUNTER — Other Ambulatory Visit: Payer: Self-pay | Admitting: Family Medicine

## 2016-09-26 ENCOUNTER — Ambulatory Visit
Admission: RE | Admit: 2016-09-26 | Discharge: 2016-09-26 | Disposition: A | Discharge status: Home / Self Care | Payer: Medicare HMO | Source: Ambulatory Visit | Attending: Family Medicine | Admitting: Family Medicine

## 2016-09-26 DIAGNOSIS — N6320 Unspecified lump in the left breast, unspecified quadrant: Secondary | ICD-10-CM

## 2016-09-26 DIAGNOSIS — R928 Other abnormal and inconclusive findings on diagnostic imaging of breast: Secondary | ICD-10-CM

## 2016-09-26 DIAGNOSIS — Z0000 Encounter for general adult medical examination without abnormal findings: Secondary | ICD-10-CM

## 2016-09-26 DIAGNOSIS — Z1231 Encounter for screening mammogram for malignant neoplasm of breast: Secondary | ICD-10-CM | POA: Diagnosis not present

## 2016-10-04 ENCOUNTER — Ambulatory Visit
Admission: RE | Admit: 2016-10-04 | Discharge: 2016-10-04 | Disposition: A | Discharge status: Home / Self Care | Payer: Medicare HMO | Source: Ambulatory Visit | Attending: Family Medicine | Admitting: Family Medicine

## 2016-10-04 DIAGNOSIS — N6320 Unspecified lump in the left breast, unspecified quadrant: Secondary | ICD-10-CM | POA: Diagnosis present

## 2016-10-04 DIAGNOSIS — R928 Other abnormal and inconclusive findings on diagnostic imaging of breast: Secondary | ICD-10-CM

## 2016-10-04 DIAGNOSIS — N641 Fat necrosis of breast: Secondary | ICD-10-CM | POA: Diagnosis not present

## 2016-10-04 DIAGNOSIS — N6321 Unspecified lump in the left breast, upper outer quadrant: Secondary | ICD-10-CM | POA: Diagnosis not present

## 2016-11-29 DIAGNOSIS — R69 Illness, unspecified: Secondary | ICD-10-CM | POA: Diagnosis not present

## 2016-12-05 DIAGNOSIS — G43109 Migraine with aura, not intractable, without status migrainosus: Secondary | ICD-10-CM | POA: Diagnosis not present

## 2016-12-05 DIAGNOSIS — R413 Other amnesia: Secondary | ICD-10-CM | POA: Diagnosis not present

## 2016-12-05 DIAGNOSIS — R69 Illness, unspecified: Secondary | ICD-10-CM | POA: Diagnosis not present

## 2017-03-01 DIAGNOSIS — R69 Illness, unspecified: Secondary | ICD-10-CM | POA: Diagnosis not present

## 2017-03-14 ENCOUNTER — Other Ambulatory Visit: Payer: Self-pay | Admitting: Family Medicine

## 2017-03-14 DIAGNOSIS — E782 Mixed hyperlipidemia: Secondary | ICD-10-CM

## 2017-04-06 ENCOUNTER — Observation Stay: Payer: MEDICARE

## 2017-04-06 ENCOUNTER — Inpatient Hospital Stay
Admission: EM | Admit: 2017-04-06 | Discharge: 2017-04-07 | DRG: 378 | Disposition: A | Discharge status: Home / Self Care | Payer: MEDICARE | Attending: Internal Medicine | Admitting: Internal Medicine

## 2017-04-06 ENCOUNTER — Encounter: Payer: Self-pay | Admitting: Emergency Medicine

## 2017-04-06 ENCOUNTER — Other Ambulatory Visit: Payer: Self-pay

## 2017-04-06 DIAGNOSIS — Z7982 Long term (current) use of aspirin: Secondary | ICD-10-CM

## 2017-04-06 DIAGNOSIS — Z79899 Other long term (current) drug therapy: Secondary | ICD-10-CM | POA: Diagnosis not present

## 2017-04-06 DIAGNOSIS — D62 Acute posthemorrhagic anemia: Secondary | ICD-10-CM | POA: Diagnosis not present

## 2017-04-06 DIAGNOSIS — K644 Residual hemorrhoidal skin tags: Secondary | ICD-10-CM | POA: Diagnosis present

## 2017-04-06 DIAGNOSIS — Z885 Allergy status to narcotic agent status: Secondary | ICD-10-CM

## 2017-04-06 DIAGNOSIS — E785 Hyperlipidemia, unspecified: Secondary | ICD-10-CM | POA: Diagnosis present

## 2017-04-06 DIAGNOSIS — K5731 Diverticulosis of large intestine without perforation or abscess with bleeding: Principal | ICD-10-CM | POA: Diagnosis present

## 2017-04-06 DIAGNOSIS — K625 Hemorrhage of anus and rectum: Secondary | ICD-10-CM | POA: Diagnosis not present

## 2017-04-06 DIAGNOSIS — K921 Melena: Secondary | ICD-10-CM

## 2017-04-06 DIAGNOSIS — R1310 Dysphagia, unspecified: Secondary | ICD-10-CM | POA: Diagnosis not present

## 2017-04-06 DIAGNOSIS — I959 Hypotension, unspecified: Secondary | ICD-10-CM | POA: Diagnosis not present

## 2017-04-06 DIAGNOSIS — K922 Gastrointestinal hemorrhage, unspecified: Secondary | ICD-10-CM | POA: Diagnosis not present

## 2017-04-06 DIAGNOSIS — K219 Gastro-esophageal reflux disease without esophagitis: Secondary | ICD-10-CM | POA: Diagnosis present

## 2017-04-06 DIAGNOSIS — E039 Hypothyroidism, unspecified: Secondary | ICD-10-CM | POA: Diagnosis not present

## 2017-04-06 DIAGNOSIS — K449 Diaphragmatic hernia without obstruction or gangrene: Secondary | ICD-10-CM | POA: Diagnosis not present

## 2017-04-06 DIAGNOSIS — I10 Essential (primary) hypertension: Secondary | ICD-10-CM | POA: Diagnosis present

## 2017-04-06 DIAGNOSIS — K5730 Diverticulosis of large intestine without perforation or abscess without bleeding: Secondary | ICD-10-CM

## 2017-04-06 DIAGNOSIS — I9589 Other hypotension: Secondary | ICD-10-CM | POA: Diagnosis present

## 2017-04-06 DIAGNOSIS — K9189 Other postprocedural complications and disorders of digestive system: Secondary | ICD-10-CM | POA: Diagnosis not present

## 2017-04-06 LAB — CBC
HCT: 30 % — ABNORMAL LOW (ref 35.0–47.0)
Hemoglobin: 9.9 g/dL — ABNORMAL LOW (ref 12.0–16.0)
MCH: 31 pg (ref 26.0–34.0)
MCHC: 32.9 g/dL (ref 32.0–36.0)
MCV: 94.4 fL (ref 80.0–100.0)
Platelets: 161 10*3/uL (ref 150–440)
RBC: 3.18 MIL/uL — ABNORMAL LOW (ref 3.80–5.20)
RDW: 13.5 % (ref 11.5–14.5)
WBC: 7.6 10*3/uL (ref 3.6–11.0)

## 2017-04-06 LAB — COMPREHENSIVE METABOLIC PANEL
ALT: 9 U/L — ABNORMAL LOW (ref 14–54)
AST: 17 U/L (ref 15–41)
Albumin: 3.5 g/dL (ref 3.5–5.0)
Alkaline Phosphatase: 58 U/L (ref 38–126)
Anion gap: 6 (ref 5–15)
BUN: 16 mg/dL (ref 6–20)
CO2: 23 mmol/L (ref 22–32)
Calcium: 8.6 mg/dL — ABNORMAL LOW (ref 8.9–10.3)
Chloride: 107 mmol/L (ref 101–111)
Creatinine, Ser: 1.06 mg/dL — ABNORMAL HIGH (ref 0.44–1.00)
GFR calc Af Amer: 60 mL/min — ABNORMAL LOW (ref >60)
GFR calc non Af Amer: 52 mL/min — ABNORMAL LOW (ref >60)
Glucose, Bld: 160 mg/dL — ABNORMAL HIGH (ref 65–99)
Potassium: 3.8 mmol/L (ref 3.5–5.1)
Sodium: 136 mmol/L (ref 135–145)
Total Bilirubin: 1 mg/dL (ref 0.3–1.2)
Total Protein: 6.6 g/dL (ref 6.5–8.1)

## 2017-04-06 LAB — HEMOGLOBIN AND HEMATOCRIT, BLOOD
HCT: 25.8 % — ABNORMAL LOW (ref 35.0–47.0)
HCT: 24.9 % — ABNORMAL LOW (ref 35.0–47.0)
Hemoglobin: 8.5 g/dL — ABNORMAL LOW (ref 12.0–16.0)
Hemoglobin: 8.2 g/dL — ABNORMAL LOW (ref 12.0–16.0)

## 2017-04-06 LAB — IRON AND TIBC
Iron: 63 ug/dL (ref 28–170)
Saturation Ratios: 22 % (ref 10.4–31.8)
TIBC: 283 ug/dL (ref 250–450)
UIBC: 220 ug/dL

## 2017-04-06 LAB — PROTIME-INR
INR: 1.17
Prothrombin Time: 14.8 seconds (ref 11.4–15.2)

## 2017-04-06 LAB — FERRITIN: Ferritin: 113 ng/mL (ref 11–307)

## 2017-04-06 MED ORDER — TECHNETIUM TC 99M-LABELED RED BLOOD CELLS IV KIT
22.0000 | PACK | Freq: Once | INTRAVENOUS | Status: AC | PRN
  Administered 2017-04-06: 19:00:00 21.67 via INTRAVENOUS

## 2017-04-06 MED ORDER — ONDANSETRON HCL 4 MG/2ML IJ SOLN: 4.0000 mg | Freq: Four times a day (QID) | INTRAMUSCULAR | Status: DC | PRN

## 2017-04-06 MED ORDER — PANTOPRAZOLE SODIUM 40 MG IV SOLR
8.0000 mg/h | INTRAVENOUS | Status: DC
  Administered 2017-04-06: 8 mg/h via INTRAVENOUS
  Filled 2017-04-06: qty 80

## 2017-04-06 MED ORDER — LORATADINE 10 MG PO TABS: 10.0000 mg | ORAL_TABLET | Freq: Every day | ORAL | Status: DC

## 2017-04-06 MED ORDER — BISACODYL 5 MG PO TBEC: 5.0000 mg | DELAYED_RELEASE_TABLET | Freq: Every day | ORAL | Status: DC | PRN

## 2017-04-06 MED ORDER — SODIUM CHLORIDE 0.9 % IV SOLN
INTRAVENOUS | Status: DC
  Administered 2017-04-06 – 2017-04-07 (×4): via INTRAVENOUS

## 2017-04-06 MED ORDER — PRAVASTATIN SODIUM 20 MG PO TABS: 20.0000 mg | ORAL_TABLET | Freq: Every day | ORAL | Status: DC

## 2017-04-06 MED ORDER — TRAZODONE HCL 50 MG PO TABS: 25.0000 mg | ORAL_TABLET | Freq: Every evening | ORAL | Status: DC | PRN

## 2017-04-06 MED ORDER — ACETAMINOPHEN 325 MG PO TABS: 650.0000 mg | ORAL_TABLET | Freq: Four times a day (QID) | ORAL | Status: DC | PRN

## 2017-04-06 MED ORDER — LEVOTHYROXINE SODIUM 100 MCG PO TABS: 100.0000 ug | ORAL_TABLET | Freq: Every day | ORAL | Status: DC

## 2017-04-06 MED ORDER — DOCUSATE SODIUM 100 MG PO CAPS: 100.0000 mg | ORAL_CAPSULE | Freq: Two times a day (BID) | ORAL | Status: DC

## 2017-04-06 MED ORDER — PEG 3350-KCL-NA BICARB-NACL 420 G PO SOLR
4000.0000 mL | Freq: Once | ORAL | Status: AC
  Administered 2017-04-06: 22:00:00 4000 mL via ORAL
  Filled 2017-04-06: qty 4000

## 2017-04-06 MED ORDER — MONTELUKAST SODIUM 10 MG PO TABS: 10.0000 mg | ORAL_TABLET | Freq: Every day | ORAL | Status: DC

## 2017-04-06 MED ORDER — FLUTICASONE PROPIONATE 50 MCG/ACT NA SUSP
1.0000 | Freq: Every day | NASAL | Status: DC
  Filled 2017-04-06: qty 16

## 2017-04-06 MED ORDER — SODIUM CHLORIDE 0.9 % IV SOLN
80.0000 mg | Freq: Once | INTRAVENOUS | Status: AC
  Administered 2017-04-06: 80 mg via INTRAVENOUS
  Filled 2017-04-06: qty 80

## 2017-04-06 MED ORDER — LACTATED RINGERS IV BOLUS (SEPSIS)
500.0000 mL | Freq: Once | INTRAVENOUS | Status: AC
  Administered 2017-04-06: 500 mL via INTRAVENOUS

## 2017-04-06 MED ORDER — PANTOPRAZOLE SODIUM 40 MG IV SOLR: 40.0000 mg | Freq: Two times a day (BID) | INTRAVENOUS | Status: DC

## 2017-04-06 MED ORDER — ONDANSETRON HCL 4 MG PO TABS: 4.0000 mg | ORAL_TABLET | Freq: Four times a day (QID) | ORAL | Status: DC | PRN

## 2017-04-06 MED ORDER — ACETAMINOPHEN 650 MG RE SUPP: 650.0000 mg | Freq: Four times a day (QID) | RECTAL | Status: DC | PRN

## 2017-04-06 NOTE — Progress Notes (Addendum)
Patient two incidences of frank bright red blood with clots from rectum and no stool.    EBL  130-140 ml

## 2017-04-06 NOTE — H&P (Signed)
[facility] NAME: Pamela Hendrix    MR#:  [id number]  DATE OF BIRTH:  03-28-1946  DATE OF ADMISSION:  04/06/2017  PRIMARY CARE PHYSICIAN: Juline Patch, MD   REQUESTING/REFERRING PHYSICIAN: Shelba Flake MD  CHIEF COMPLAINT:   Chief Complaint  Patient presents with  . Rectal Bleeding    HISTORY OF PRESENT ILLNESS:  Pamela Hendrix  is a 71 y.o. female with a known history of hypertension, GERD is being admitted for rectal bleeding.  Patient has been having discomfort in her lower abdominal area since last night had 3 bright red blood per rectum last evening.  She woke up at 4 AM today with another bleed.  She was hoping to watch it at home but then had another bleeding at around 8:30 AM and there was still bloody.  Patient had a heavy bright red blood clots throughout morning.  She went to to take shower and was feeling dizzy.  She just could not hold it any bowel movement going forward and decided to come to the hospital emergency department.  While in the ED she had one more bowel movement which was also bloody.  She is being admitted for further evaluation and management. PAST MEDICAL HISTORY:   Past Medical History:  Diagnosis Date  . Allergy   . GERD (gastroesophageal reflux disease)   . Hypertension   . Thyroid disease     PAST SURGICAL HISTORY:   Past Surgical History:  Procedure Laterality Date  . CHOLECYSTECTOMY    . COLONOSCOPY  06/02/2014   cleared for 10 yrs- Dr Rayann Heman  . ESOPHAGEAL DILATION    . TONSILLECTOMY    . UPPER GI ENDOSCOPY  06/02/2014    SOCIAL HISTORY:   Social History   Tobacco Use  . Smoking status: Never Smoker  . Smokeless tobacco: Never Used  Substance Use Topics  . Alcohol use: No    Alcohol/week: 0.0 oz    FAMILY HISTORY:   Family History  Problem Relation Age of Onset  . Breast cancer Neg Hx     DRUG ALLERGIES:   Allergies  Allergen Reactions  . Codeine Nausea  Only    REVIEW OF SYSTEMS:   Review of Systems  Constitutional: Positive for malaise/fatigue. Negative for chills, fever and weight loss.  HENT: Negative for nosebleeds and sore throat.   Eyes: Negative for blurred vision.  Respiratory: Negative for cough, shortness of breath and wheezing.   Cardiovascular: Negative for chest pain, orthopnea, leg swelling and PND.  Gastrointestinal: Positive for blood in stool. Negative for abdominal pain, constipation, diarrhea, heartburn, nausea and vomiting.  Genitourinary: Negative for dysuria and urgency.  Musculoskeletal: Negative for back pain.  Skin: Negative for rash.  Neurological: Positive for dizziness. Negative for speech change, focal weakness and headaches.  Endo/Heme/Allergies: Does not bruise/bleed easily.  Psychiatric/Behavioral: Negative for depression.    MEDICATIONS AT HOME:   Prior to Admission medications   Medication Sig Start Date End Date Taking? Authorizing Provider  aspirin EC 81 MG tablet Take 81 mg by mouth daily.   Yes [provider]  cetirizine (ZYRTEC) 10 MG tablet Take 10 mg by mouth daily. otc   Yes [provider]  levothyroxine (SYNTHROID, LEVOTHROID) 100 MCG tablet Take 1 tablet (100 mcg total) by mouth daily. 06/28/16  Yes Juline Patch, MD  lovastatin (MEVACOR) 20 MG tablet TAKE 1 TABLET BY MOUTH ONCE DAILY 03/14/17  Yes Juline Patch, MD  montelukast (SINGULAIR) 10 MG tablet Take 1 tablet (10 mg total) by mouth daily. 09/07/16  Yes Juline Patch, MD  fluticasone (FLONASE) 50 MCG/ACT nasal spray Place 1 spray into both nostrils daily. 06/28/16   Juline Patch, MD      VITAL SIGNS:  Blood pressure (!) 104/52, pulse (!) 59, temperature 98 F (36.7 C), temperature source Oral, resp. rate 18, weight 73 kg (161 lb), SpO2 100 %. PHYSICAL EXAMINATION:  Physical Exam  Constitutional: She is oriented to person, place, and time and well-developed, well-nourished, and in no distress.  HENT:    Head: Normocephalic and atraumatic.  Eyes: Conjunctivae and EOM are normal. Pupils are equal, round, and reactive to light.  Neck: Normal range of motion. Neck supple. No tracheal deviation present. No thyromegaly present.  Cardiovascular: Normal rate, regular rhythm and normal heart sounds.  Pulmonary/Chest: Effort normal and breath sounds normal. No respiratory distress. She has no wheezes. She exhibits no tenderness.  Abdominal: Soft. Bowel sounds are normal. She exhibits no distension. There is no tenderness.  Musculoskeletal: Normal range of motion.  Neurological: She is alert and oriented to person, place, and time. No cranial nerve deficit.  Skin: Skin is warm and dry. No rash noted.  Psychiatric: Mood and affect normal.   LABORATORY PANEL:   CBC Recent Labs  Lab 04/06/17 0921  WBC 7.6  HGB 9.9*  HCT 30.0*  PLT 161   ------------------------------------------------------------------------------------------------------------------  Chemistries  Recent Labs  Lab 04/06/17 0921  NA 136  K 3.8  CL 107  CO2 23  GLUCOSE 160*  BUN 16  CREATININE 1.06*  CALCIUM 8.6*  AST 17  ALT 9*  ALKPHOS 79  BILITOT 1.0   ------------------------------------------------------------------------------------------------------------------  IMPRESSION AND PLAN:  71 year old female being admitted for rectal bleeding  *Rectal bleeding: -Likely diverticular -We will consult GI -Case discussed with Dr. Marius Ditch - Plan for endoscopy tomorrow -H&H every 8 hours for now -Protonix 40 mg IV twice daily -Hold Aspirin  * Acute Blood loss anemia -We will monitor H&H for now.  Blood transfusion if hemoglobin drops less than 7  *Hypothyroidism  - continue levothyroxine  *Hypotension - from ongoing bleeding -Aggressive IV hydration and monitor blood pressure    All the records are reviewed and case discussed with ED provider. Management plans discussed with the patient, nursing and they  are in agreement.  CODE STATUS: Full code  TOTAL TIME TAKING CARE OF THIS PATIENT: 45 minutes.    Max Sane M.D on 04/06/2017 at 12:24 PM  Between 7am to 6pm - Pager - [phone]  After 6pm go to [email or web address] - Proofreader  [facility]  Office  [phone]  CC: Primary care physician; Juline Patch, MD   Note: This dictation was prepared with Dragon dictation along with smaller phrase technology. Any transcriptional errors that result from this process are unintentional.

## 2017-04-06 NOTE — ED Triage Notes (Signed)
Pt to ed with c/o rectal bleeding, started last night, dark in color this am, heavy bright red clots and heavy bleeding.

## 2017-04-06 NOTE — ED Notes (Signed)
Admitting Provider at bedside. 

## 2017-04-06 NOTE — Consult Note (Signed)
Pamela Darby, MD [address]  [place]  [place], [place] [address]  Main: [phone]  Fax: [phone] Pager: [phone]   Consultation  Referring Provider:     No ref. provider found Primary Care Physician:  Juline Patch, MD Primary Gastroenterologist:  Dr. Darrick Grinder       Reason for Consultation:   Hematochezia  Date of Admission:  04/06/2017 Date of Consultation:  04/06/2017         HPI:   Pamela Hendrix is a 71 y.o. female with history of GERD, large hiatal hernia status post laparoscopic repair and Toupet fundoplication and PEG tube for gastropexy at [facility] by Dr. Margart Sickles on 08/28/2014.  The PEG tube was removed 6 weeks after surgery.  Apparently, she was having episodes of dysphagia and emesis, underwent multiple upper endoscopies with empiric balloon dilation to 20 mm as of 03/2015.   She now presents with 1 day history of several episodes of hematochezia described as dark red blood associated with clots resulting in dizziness.  She reports mild lower abdominal cramps associated with rectal bleeding.  Her baseline bowel movements are formed and nonbloody.  She did not have similar episodes in the past. Prior to this episode, she was having regular BMs, brown color. She was told that she has diverticulosis.  Her baseline hemoglobin is between 12 and 13.  Today her hemoglobin was 9.9, further dropped to 8.5.  She is receiving IV fluids and currently hemodynamically stable.  She also c/o intermittent dysphagia to solids only  She had another episode of hematochezia 148m of dark red blood with clots per her nurse around 5pm. She felt nauseous during the episode. BP 99/61, HR 65. She denies cp, lightheadedness, SOB  NSAIDs: [id number]  Antiplts/Anticoagulants/Anti thrombotics: none  GI Procedures: [place] [facility], was reportedly normal  Past Medical History:  Diagnosis Date  . Allergy   . GERD (gastroesophageal reflux disease)   .  Hypertension   . Thyroid disease     Past Surgical History:  Procedure Laterality Date  . CHOLECYSTECTOMY    . COLONOSCOPY  06/02/2014   cleared for 10 yrs- Dr RRayann Heman . ESOPHAGEAL DILATION    . TONSILLECTOMY    . UPPER GI ENDOSCOPY  06/02/2014    Prior to Admission medications   Medication Sig Start Date End Date Taking? Authorizing Provider  aspirin EC 81 MG tablet Take 81 mg by mouth daily.   Yes [provider]  cetirizine (ZYRTEC) 10 MG tablet Take 10 mg by mouth daily. otc   Yes [provider]  levothyroxine (SYNTHROID, LEVOTHROID) 100 MCG tablet Take 1 tablet (100 mcg total) by mouth daily. 06/28/16  Yes JJuline Patch MD  lovastatin (MEVACOR) 20 MG tablet TAKE 1 TABLET BY MOUTH ONCE DAILY 03/14/17  Yes JJuline Patch MD  montelukast (SINGULAIR) 10 MG tablet Take 1 tablet (10 mg total) by mouth daily. 09/07/16  Yes JJuline Patch MD  fluticasone (FLONASE) 50 MCG/ACT nasal spray Place 1 spray into both nostrils daily. 06/28/16   JJuline Patch MD    Family History  Problem Relation Age of Onset  . Breast cancer Neg Hx      Social History   Tobacco Use  . Smoking status: Never Smoker  . Smokeless tobacco: Never Used  Substance Use Topics  . Alcohol use: No    Alcohol/week: 0.0 oz  . Drug use: No    Allergies as of 04/06/2017 -  Review Complete 04/06/2017  Allergen Reaction Noted  . Codeine Nausea Only 06/19/2015    Review of Systems:    All systems reviewed and negative except where noted in HPI.   Physical Exam:  Vital signs in last 24 hours: Temp:  [97.7 F (36.5 C)-98 F (36.7 C)] 97.7 F (36.5 C) (11/22 1231) Pulse Rate:  [59-82] 67 (11/22 1638) Resp:  [18-20] 20 (11/22 1638) BP: (99-118)/(50-53) 99/50 (11/22 1638) SpO2:  [100 %] 100 % (11/22 1638) Weight:  [151 lb (68.5 kg)-161 lb (73 kg)] 151 lb (68.5 kg) (11/22 1236)   General:   Pleasant, cooperative in NAD Head:  Normocephalic and atraumatic. Eyes:   No icterus.    Conjunctiva pink. PERRLA. Ears:  Normal auditory acuity. Neck:  Supple; no masses or thyroidomegaly Lungs: Respirations even and unlabored. Lungs clear to auscultation bilaterally.   No wheezes, crackles, or rhonchi.  Heart:  Regular rate and rhythm;  Without murmur, clicks, rubs or gallops Abdomen:  Soft, nondistended, nontender. Normal bowel sounds. No appreciable masses or hepatomegaly.  No rebound or guarding.  Rectal:  Not performed. Msk:  Symmetrical without gross deformities.  Strength normal  Extremities:  Without edema, cyanosis or clubbing. Neurologic:  Alert and oriented x3;  grossly normal neurologically. Skin:  Intact without significant lesions or rashes. Cervical Nodes:  No significant cervical adenopathy. Psych:  Alert and cooperative. Normal affect.  LAB RESULTS: CBC Latest Ref Rng & Units 04/06/2017 04/06/2017 12/05/2013  WBC 3.6 - 11.0 K/uL - 7.6 10.3  Hemoglobin 12.0 - 16.0 g/dL 8.5(L) 9.9(L) 13.5  Hematocrit 35.0 - 47.0 % 25.8(L) 30.0(L) 40.9  Platelets 150 - 440 K/uL - 161 219    BMET BMP Latest Ref Rng & Units 04/06/2017 09/07/2016 03/17/2016  Glucose 65 - 99 mg/dL 160(H) 93 -  BUN 6 - 20 mg/dL 16 14 -  Creatinine 0.44 - 1.00 mg/dL 1.06(H) 0.92 1.15(H)  BUN/Creat Ratio 12 - 28 - 15 -  Sodium 135 - 145 mmol/L 136 141 -  Potassium 3.5 - 5.1 mmol/L 3.8 4.7 -  Chloride 101 - 111 mmol/L 107 101 -  CO2 22 - 32 mmol/L 23 26 -  Calcium 8.9 - 10.3 mg/dL 8.6(L) 9.4 -    LFT Hepatic Function Latest Ref Rng & Units 04/06/2017 09/07/2016 09/25/2015  Total Protein 6.5 - 8.1 g/dL 6.6 - -  Albumin 3.5 - 5.0 g/dL 3.5 4.3 4.1  AST 15 - 41 U/L 17 - -  ALT 14 - 54 U/L 9(L) - -  Alk Phosphatase 38 - 126 U/L 58 - -  Total Bilirubin 0.3 - 1.2 mg/dL 1.0 - -     STUDIES: No results found.    Impression / Plan:   Pamela Hendrix is a 71 y.o. female with history of large hiatal hernia, GERD status post fundoplication and hernia repair, temporary gastropexy with PEG  tube now presents with 1 day history of several episodes of hematochezia resulting in symptomatic anemia.  Her clinical presentation is consistent with lower GI bleed.  Differentials include most likely diverticular or ischemic colitis or dieulafoy's, or brisk upper GI bleed which is less likely. She also has chronic intermittent dysphagia  -NPO -Recommend bleeding scan to locate the source of bleeding, if positive will consult IR for embolization -Otherwise, plan for colonoscopy tomorrow -Bowel prep -Avoid anticoagulation -Can stop PPI drip - Will also perform EGD along with colonoscopy  Thank you for involving me in the care of this patient.  LOS: 0 days   Sherri Sear, MD  04/06/2017, 6:01 PM   Note: This dictation was prepared with Dragon dictation along with smaller phrase technology. Any transcriptional errors that result from this process are unintentional.

## 2017-04-06 NOTE — ED Provider Notes (Signed)
[facility] Emergency Department Provider Note   ____________________________________________   I have reviewed the triage vital signs and the nursing notes.   HISTORY  Chief Complaint Rectal Bleeding   History limited by: Not Limited   HPI Pamela Hendrix is a 71 y.o. female who presents to the emergency department today because of concern for rectal bleed.   LOCATION: rectum DURATION:1 day TIMING: intermittent QUALITY: maroon, clots CONTEXT: patient states that she started having rectal bleeding yesterday evening. Has now had multiple episodes. Has history of diverticulum.  MODIFYING FACTORS: none ASSOCIATED SYMPTOMS: patient felt dizzy when she got in the shower today  Per medical record review patient has a history of hiatal hernia repair.   Past Medical History:  Diagnosis Date  . Allergy   . GERD (gastroesophageal reflux disease)   . Hypertension   . Thyroid disease     Patient Active Problem List   Diagnosis Date Noted  . Chronic allergic rhinitis 06/28/2016  . Mixed hyperlipidemia 06/28/2016  . Personal history of transient ischemic attack (TIA), and cerebral infarction without residual deficits 08/28/2014  . Hiatal hernia 08/28/2014  . Hypercholesterolemia 08/28/2014  . Hypothyroidism 08/28/2014  . Cephalalgia 10/25/2013  . BP (high blood pressure) 10/25/2013  . Numbness and tingling 10/25/2013  . Adiposity 10/25/2013  . Other general symptoms and signs 10/25/2013  . Temporary cerebral vascular dysfunction 10/25/2013  . Unspecified visual disturbance 10/25/2013  . Thyroid nodule 09/19/2013  . Dermatophytic onychia 08/21/2013    Past Surgical History:  Procedure Laterality Date  . CHOLECYSTECTOMY    . COLONOSCOPY  06/02/2014   cleared for 10 yrs- Dr Rayann Heman  . ESOPHAGEAL DILATION    . TONSILLECTOMY    . UPPER GI ENDOSCOPY  06/02/2014    Prior to Admission medications   Medication Sig Start Date End Date Taking?  Authorizing Provider  aspirin EC 81 MG tablet Take 81 mg by mouth daily.    [provider]  CALCIUM-MAGNESIUM-VITAMIN D PO Take 2 each by mouth daily. otc- 2 tablespoons daily    [provider]  cetirizine (ZYRTEC) 10 MG tablet Take 10 mg by mouth daily. otc    [provider]  fluticasone (FLONASE) 50 MCG/ACT nasal spray Place 1 spray into both nostrils daily. 06/28/16   Juline Patch, MD  levothyroxine (SYNTHROID, LEVOTHROID) 100 MCG tablet Take 1 tablet (100 mcg total) by mouth daily. 06/28/16   Juline Patch, MD  lovastatin (MEVACOR) 20 MG tablet TAKE 1 TABLET BY MOUTH ONCE DAILY 03/14/17   Juline Patch, MD  montelukast (SINGULAIR) 10 MG tablet Take 1 tablet (10 mg total) by mouth daily. 09/07/16   Juline Patch, MD  simethicone (GAS-X EXTRA STRENGTH) 125 MG chewable tablet Chew 125 mg by mouth daily.    [provider]    Allergies Codeine  Family History  Problem Relation Age of Onset  . Breast cancer Neg Hx     Social History Social History   Tobacco Use  . Smoking status: Never Smoker  . Smokeless tobacco: Never Used  Substance Use Topics  . Alcohol use: No    Alcohol/week: 0.0 oz  . Drug use: No    Review of Systems Constitutional: No fever/chills Eyes: No visual changes. ENT: No sore throat. Cardiovascular: Denies chest pain. Respiratory: Denies shortness of breath. Gastrointestinal: No abdominal pain.  No nausea, no vomiting.  No diarrhea.  Positive for rectal bleed.  Genitourinary: Negative for dysuria. Musculoskeletal: Negative for back  pain. Skin: Negative for rash. Neurological: Positive for dizziness.   ____________________________________________   PHYSICAL EXAM:  VITAL SIGNS: ED Triage Vitals [04/06/17 0918]  Enc Vitals Group     BP      Pulse Rate 82     Resp 18     Temp 98 F (36.7 C)     Temp Source Oral     SpO2 100 %     Weight 161 lb (73 kg)     Height      Head Circumference      Peak  Flow      Pain Score 1   Constitutional: Alert and oriented. Well appearing and in no distress. Eyes: Conjunctivae are normal.  ENT   Head: Normocephalic and atraumatic.   Nose: No congestion/rhinnorhea.   Mouth/Throat: Mucous membranes are moist.   Neck: No stridor. Hematological/Lymphatic/Immunilogical: No cervical lymphadenopathy. Cardiovascular: Normal rate, regular rhythm.  No murmurs, rubs, or gallops.  Respiratory: Normal respiratory effort without tachypnea nor retractions. Breath sounds are clear and equal bilaterally. No wheezes/rales/rhonchi. Gastrointestinal: Soft and non tender. No rebound. No guarding.  Genitourinary: Deferred Musculoskeletal: Normal range of motion in all extremities. No lower extremity edema. Neurologic:  Normal speech and language. No gross focal neurologic deficits are appreciated.  Skin:  Skin is warm, dry and intact. No rash noted. Psychiatric: Mood and affect are normal. Speech and behavior are normal. Patient exhibits appropriate insight and judgment.  ____________________________________________    LABS (pertinent positives/negatives)  CBC hgb 9.9 CMP glu 160, cr 1.06  ____________________________________________   EKG  None  ____________________________________________    RADIOLOGY  None   ____________________________________________   PROCEDURES  Procedures  ____________________________________________   INITIAL IMPRESSION / ASSESSMENT AND PLAN / ED COURSE  Pertinent labs & imaging results that were available during my care of the patient were reviewed by me and considered in my medical decision making (see chart for details).  Patient presents because of concern for rectal bleed, had an episode of dizziness. Concern for anemia. Hgb is 9.9. Will plan on admission to hospitalist service.    ____________________________________________   FINAL CLINICAL IMPRESSION(S) / ED DIAGNOSES  Final diagnoses:   Rectal bleeding     Note: This dictation was prepared with Dragon dictation. Any transcriptional errors that result from this process are unintentional     Nance Pear, MD 04/06/17 1133

## 2017-04-06 NOTE — ED Notes (Signed)
Bright red blood per rectum noted in toilet following BM.

## 2017-04-06 NOTE — ED Notes (Signed)
ED Provider at bedside. 

## 2017-04-06 NOTE — ED Notes (Signed)
Attempted to start IV x3 without success. Megan RN to attempt.

## 2017-04-07 ENCOUNTER — Inpatient Hospital Stay: Payer: MEDICARE | Admitting: Anesthesiology

## 2017-04-07 ENCOUNTER — Encounter
Admission: EM | Disposition: A | Discharge status: Home / Self Care | Payer: Self-pay | Source: Home / Self Care | Attending: Internal Medicine

## 2017-04-07 ENCOUNTER — Encounter: Payer: Self-pay | Admitting: Anesthesiology

## 2017-04-07 DIAGNOSIS — D62 Acute posthemorrhagic anemia: Secondary | ICD-10-CM | POA: Diagnosis not present

## 2017-04-07 DIAGNOSIS — E785 Hyperlipidemia, unspecified: Secondary | ICD-10-CM | POA: Diagnosis not present

## 2017-04-07 DIAGNOSIS — Z79899 Other long term (current) drug therapy: Secondary | ICD-10-CM | POA: Diagnosis not present

## 2017-04-07 DIAGNOSIS — K5790 Diverticulosis of intestine, part unspecified, without perforation or abscess without bleeding: Secondary | ICD-10-CM | POA: Diagnosis not present

## 2017-04-07 DIAGNOSIS — K9189 Other postprocedural complications and disorders of digestive system: Secondary | ICD-10-CM | POA: Diagnosis not present

## 2017-04-07 DIAGNOSIS — I10 Essential (primary) hypertension: Secondary | ICD-10-CM | POA: Diagnosis not present

## 2017-04-07 DIAGNOSIS — E039 Hypothyroidism, unspecified: Secondary | ICD-10-CM | POA: Diagnosis not present

## 2017-04-07 DIAGNOSIS — K644 Residual hemorrhoidal skin tags: Secondary | ICD-10-CM | POA: Diagnosis not present

## 2017-04-07 DIAGNOSIS — D649 Anemia, unspecified: Secondary | ICD-10-CM | POA: Diagnosis not present

## 2017-04-07 DIAGNOSIS — K922 Gastrointestinal hemorrhage, unspecified: Secondary | ICD-10-CM | POA: Diagnosis not present

## 2017-04-07 DIAGNOSIS — K5730 Diverticulosis of large intestine without perforation or abscess without bleeding: Secondary | ICD-10-CM | POA: Diagnosis not present

## 2017-04-07 DIAGNOSIS — K222 Esophageal obstruction: Secondary | ICD-10-CM | POA: Diagnosis not present

## 2017-04-07 DIAGNOSIS — K5731 Diverticulosis of large intestine without perforation or abscess with bleeding: Secondary | ICD-10-CM | POA: Diagnosis present

## 2017-04-07 DIAGNOSIS — I9589 Other hypotension: Secondary | ICD-10-CM | POA: Diagnosis not present

## 2017-04-07 DIAGNOSIS — K921 Melena: Secondary | ICD-10-CM | POA: Diagnosis not present

## 2017-04-07 DIAGNOSIS — R1310 Dysphagia, unspecified: Secondary | ICD-10-CM | POA: Diagnosis not present

## 2017-04-07 DIAGNOSIS — Z7982 Long term (current) use of aspirin: Secondary | ICD-10-CM | POA: Diagnosis not present

## 2017-04-07 DIAGNOSIS — Z885 Allergy status to narcotic agent status: Secondary | ICD-10-CM | POA: Diagnosis not present

## 2017-04-07 DIAGNOSIS — K625 Hemorrhage of anus and rectum: Secondary | ICD-10-CM | POA: Diagnosis not present

## 2017-04-07 DIAGNOSIS — K449 Diaphragmatic hernia without obstruction or gangrene: Secondary | ICD-10-CM | POA: Diagnosis not present

## 2017-04-07 DIAGNOSIS — K219 Gastro-esophageal reflux disease without esophagitis: Secondary | ICD-10-CM | POA: Diagnosis not present

## 2017-04-07 DIAGNOSIS — Z5189 Encounter for other specified aftercare: Secondary | ICD-10-CM

## 2017-04-07 HISTORY — PX: COLONOSCOPY: SHX5424

## 2017-04-07 HISTORY — DX: Encounter for other specified aftercare: Z51.89

## 2017-04-07 HISTORY — PX: ESOPHAGOGASTRODUODENOSCOPY: SHX5428

## 2017-04-07 LAB — CBC
HCT: 20.8 % — ABNORMAL LOW (ref 35.0–47.0)
Hemoglobin: 6.7 g/dL — ABNORMAL LOW (ref 12.0–16.0)
MCH: 30.7 pg (ref 26.0–34.0)
MCHC: 32.5 g/dL (ref 32.0–36.0)
MCV: 94.4 fL (ref 80.0–100.0)
Platelets: 112 10*3/uL — ABNORMAL LOW (ref 150–440)
RBC: 2.2 MIL/uL — ABNORMAL LOW (ref 3.80–5.20)
RDW: 13.6 % (ref 11.5–14.5)
WBC: 6.1 10*3/uL (ref 3.6–11.0)

## 2017-04-07 LAB — HEMOGLOBIN AND HEMATOCRIT, BLOOD
HCT: 26.1 % — ABNORMAL LOW (ref 35.0–47.0)
Hemoglobin: 8.7 g/dL — ABNORMAL LOW (ref 12.0–16.0)

## 2017-04-07 LAB — APTT: aPTT: 34 seconds (ref 24–36)

## 2017-04-07 LAB — BASIC METABOLIC PANEL
Anion gap: 5 (ref 5–15)
BUN: 12 mg/dL (ref 6–20)
CO2: 25 mmol/L (ref 22–32)
Calcium: 7.9 mg/dL — ABNORMAL LOW (ref 8.9–10.3)
Chloride: 112 mmol/L — ABNORMAL HIGH (ref 101–111)
Creatinine, Ser: 0.9 mg/dL (ref 0.44–1.00)
GFR calc Af Amer: >60 mL/min (ref >60)
GFR calc non Af Amer: >60 mL/min (ref >60)
Glucose, Bld: 97 mg/dL (ref 65–99)
Potassium: 3.7 mmol/L (ref 3.5–5.1)
Sodium: 142 mmol/L (ref 135–145)

## 2017-04-07 LAB — ABO/RH: ABO/RH(D): A POS

## 2017-04-07 LAB — GLUCOSE, CAPILLARY: Glucose-Capillary: 101 mg/dL — ABNORMAL HIGH (ref 65–99)

## 2017-04-07 LAB — PROTIME-INR
INR: 1.21
Prothrombin Time: 15.2 seconds (ref 11.4–15.2)

## 2017-04-07 LAB — VITAMIN B12: Vitamin B-12: 148 pg/mL — ABNORMAL LOW (ref 180–914)

## 2017-04-07 LAB — PREPARE RBC (CROSSMATCH)

## 2017-04-07 LAB — FOLATE: Folate: 14.9 ng/mL (ref >5.9)

## 2017-04-07 SURGERY — COLONOSCOPY
Anesthesia: General

## 2017-04-07 MED ORDER — PROPOFOL 500 MG/50ML IV EMUL
INTRAVENOUS | Status: AC
  Filled 2017-04-07: qty 50

## 2017-04-07 MED ORDER — LIDOCAINE HCL (CARDIAC) 20 MG/ML IV SOLN
INTRAVENOUS | Status: DC | PRN
  Administered 2017-04-07: 60 mg via INTRAVENOUS

## 2017-04-07 MED ORDER — PROPOFOL 500 MG/50ML IV EMUL
INTRAVENOUS | Status: DC | PRN
  Administered 2017-04-07: 150 ug/kg/min via INTRAVENOUS

## 2017-04-07 MED ORDER — PROPOFOL 10 MG/ML IV BOLUS
INTRAVENOUS | Status: DC | PRN
  Administered 2017-04-07: 50 mg via INTRAVENOUS

## 2017-04-07 MED ORDER — LIDOCAINE HCL (PF) 2 % IJ SOLN
INTRAMUSCULAR | Status: AC
  Filled 2017-04-07: qty 10

## 2017-04-07 MED ORDER — ONDANSETRON HCL 4 MG/2ML IJ SOLN: 4.0000 mg | Freq: Once | INTRAMUSCULAR | Status: DC | PRN

## 2017-04-07 MED ORDER — MIDAZOLAM HCL 2 MG/2ML IJ SOLN
INTRAMUSCULAR | Status: AC
  Filled 2017-04-07: qty 2

## 2017-04-07 MED ORDER — FENTANYL CITRATE (PF) 100 MCG/2ML IJ SOLN: 25.0000 ug | INTRAMUSCULAR | Status: DC | PRN

## 2017-04-07 MED ORDER — SODIUM CHLORIDE 0.9 % IV SOLN
Freq: Once | INTRAVENOUS | Status: AC
  Administered 2017-04-07: 06:00:00 via INTRAVENOUS

## 2017-04-07 MED ORDER — PHENYLEPHRINE HCL 10 MG/ML IJ SOLN
INTRAMUSCULAR | Status: DC | PRN
  Administered 2017-04-07: 200 ug via INTRAVENOUS

## 2017-04-07 MED ORDER — SODIUM CHLORIDE 0.9 % IV SOLN: Freq: Once | INTRAVENOUS | Status: DC

## 2017-04-07 MED ORDER — MIDAZOLAM HCL 2 MG/2ML IJ SOLN
INTRAMUSCULAR | Status: DC | PRN
  Administered 2017-04-07: 2 mg via INTRAVENOUS

## 2017-04-07 NOTE — Anesthesia Postprocedure Evaluation (Signed)
Anesthesia Post Note  Patient: Pamela Hendrix  Procedure(s) Performed: COLONOSCOPY (N/A ) ESOPHAGOGASTRODUODENOSCOPY (EGD) (N/A )  Patient location during evaluation: PACU Anesthesia Type: General Level of consciousness: awake and alert and oriented Pain management: pain level controlled Vital Signs Assessment: post-procedure vital signs reviewed and stable Respiratory status: spontaneous breathing Cardiovascular status: blood pressure returned to baseline Anesthetic complications: no     Last Vitals:  Vitals:   04/07/17 1101 04/07/17 1353  BP: (!) 116/57 (!) 102/42  Pulse: 74 81  Resp: 17 18  Temp:  36.7 C  SpO2: 100% 100%    Last Pain:  Vitals:   04/07/17 1353  TempSrc: Oral  PainSc:                  [name]

## 2017-04-07 NOTE — Plan of Care (Signed)
  Education: Knowledge of General Education information will improve 04/07/2017 0242 - Progressing by Jeffie Pollock, RN   Health Behavior/Discharge Planning: Ability to manage health-related needs will improve 04/07/2017 0242 - Progressing by Jeffie Pollock, RN   Clinical Measurements: Will remain free from infection 04/07/2017 0242 - Progressing by Jeffie Pollock, RN   Clinical Measurements: Respiratory complications will improve 04/07/2017 0242 - Progressing by Jeffie Pollock, RN   Clinical Measurements: Cardiovascular complication will be avoided 04/07/2017 0242 - Progressing by Jeffie Pollock, RN   Elimination: Will not experience complications related to bowel motility 04/07/2017 0242 - Not Progressing by Jeffie Pollock, RN   Elimination: Will not experience complications related to bowel motility 04/07/2017 0242 - Not Progressing by Jeffie Pollock, RN   Progressing Education: Knowledge of General Education information will improve 04/07/2017 0242 - Progressing by Jeffie Pollock, RN Health Behavior/Discharge Planning: Ability to manage health-related needs will improve 04/07/2017 0242 - Progressing by Jeffie Pollock, RN Clinical Measurements: Ability to maintain clinical measurements within normal limits will improve 04/07/2017 0242 - Progressing by Jeffie Pollock, RN Will remain free from infection 04/07/2017 0242 - Progressing by Jeffie Pollock, RN Respiratory complications will improve 04/07/2017 0242 - Progressing by Jeffie Pollock, RN Cardiovascular complication will be avoided 04/07/2017 0242 - Progressing by Jeffie Pollock, RN Activity: Risk for activity intolerance will decrease 04/07/2017 0242 - Progressing by Jeffie Pollock, RN Coping: Level of anxiety will decrease 04/07/2017 0242 - Progressing by Jeffie Pollock, RN Pain Managment: General experience of comfort will  improve 04/07/2017 0242 - Progressing by Jeffie Pollock, RN Safety: Ability to remain free from injury will improve 04/07/2017 0242 - Progressing by Jeffie Pollock, RN Skin Integrity: Risk for impaired skin integrity will decrease 04/07/2017 0242 - Progressing by Jeffie Pollock, RN

## 2017-04-07 NOTE — Progress Notes (Addendum)
EGD and colonoscopy post procedure note:  EGD findings: - Normal duodenal bulb and second portion of the duodenum.  - A Toupet fundoplication was found.  The wrap appears tight.  Dilated.  - Normal gastroesophageal junction and esophagus.   Colonoscopy findings: - The examined portion of the ileum was normal.  - Residual blood in the rectum, in the sigmoid colon, in the descending colon and at the splenic flexure.  - Diverticulosis in the sigmoid colon and in the descending colon, likely source of bleeding.  - Non-bleeding external hemorrhoids.  Recs: - Return patient to hospital ward for possible discharge same day after receiving blood transfusion and adequately resuscitated.  - Ferritin normal - Check B12 and folate levels - Advance diet as tolerated today.  - Continue present medications.  - Avoid constipation - No further GI work up is needed - Recommend f/u in GI clinic with me in 4weeks - Avoid NSAIDs    Cephas Darby, MD [place]  [place], [place] [address]  Main: [phone]  Fax: [phone] Pager: [phone]

## 2017-04-07 NOTE — Transfer of Care (Signed)
Immediate Anesthesia Transfer of Care Note  Patient: Pamela Hendrix  Procedure(s) Performed: COLONOSCOPY (N/A ) ESOPHAGOGASTRODUODENOSCOPY (EGD) (N/A )  Patient Location: Endoscopy Unit  Anesthesia Type:General  Level of Consciousness: drowsy and patient cooperative  Airway & Oxygen Therapy: Patient Spontanous Breathing and Patient connected to nasal cannula oxygen  Post-op Assessment: Report given to RN, Post -op Vital signs reviewed and stable and Patient moving all extremities X 4  Post vital signs: Reviewed and stable  Last Vitals:  Vitals:   04/07/17 0923 04/07/17 1041  BP: (!) 127/47 (!) 91/49  Pulse: 73 73  Resp: 18 15  Temp: (!) 36.2 C (!) 36 C  SpO2: 99% 100%    Last Pain:  Vitals:   04/07/17 1041  TempSrc: Tympanic  PainSc:          Complications: No apparent anesthesia complications

## 2017-04-07 NOTE — Op Note (Addendum)
[facility] Gastroenterology Patient Name: Pamela Hendrix Procedure Date: 04/07/2017 9:21 AM MRN: [id number] Account #: [id number] Date of Birth: August 24, 1945 Admit Type: Inpatient Age: 71 Room: [facility] ENDO ROOM 1 Gender: Female Note Status: Finalized Procedure:            Upper GI endoscopy Indications:          Dysphagia Providers:            Lin Landsman MD, MD Referring MD:         Juline Patch, MD (Referring MD) Medicines:            Monitored Anesthesia Care Complications:        No immediate complications. Estimated blood loss: None. Procedure:            Pre-Anesthesia Assessment:                       - Prior to the procedure, a History and Physical was                        performed, and patient medications and allergies were                        reviewed. The patient is competent. The risks and                        benefits of the procedure and the sedation options and                        risks were discussed with the patient. All questions                        were answered and informed consent was obtained.                        Patient identification and proposed procedure were                        verified by the physician, the nurse, the                        anesthesiologist, the anesthetist and the technician in                        the pre-procedure area in the procedure room. Mental                        Status Examination: alert and oriented. Airway                        Examination: normal oropharyngeal airway and neck                        mobility. Respiratory Examination: clear to                        auscultation. CV Examination: normal. Prophylactic                        Antibiotics: The patient does not require prophylactic  antibiotics. Prior Anticoagulants: The patient has                        taken aspirin, last dose was 2 days prior to procedure.                        ASA Grade  Assessment: III - A patient with severe                        systemic disease. After reviewing the risks and                        benefits, the patient was deemed in satisfactory                        condition to undergo the procedure. The anesthesia plan                        was to use monitored anesthesia care (MAC). Immediately                        prior to administration of medications, the patient was                        re-assessed for adequacy to receive sedatives. The                        heart rate, respiratory rate, oxygen saturations, blood                        pressure, adequacy of pulmonary ventilation, and                        response to care were monitored throughout the                        procedure. The physical status of the patient was                        re-assessed after the procedure.                       After obtaining informed consent, the endoscope was                        passed under direct vision. Throughout the procedure,                        the patient's blood pressure, pulse, and oxygen                        saturations were monitored continuously. The Endoscope                        was introduced through the mouth, and advanced to the                        second part of duodenum. The upper GI endoscopy was  accomplished without difficulty. The patient tolerated                        the procedure well. Findings:      The duodenal bulb and second portion of the duodenum were normal.      Evidence of a Toupet fundoplication was found in the gastric fundus. The       wrap appeared tight. This was traversed. A TTS dilator was passed       through the scope. Dilation with an 18-19-20 mm balloon dilator was       performed to 20 mm. The dilation site was examined following endoscope       reinsertion and showed moderate improvement in luminal narrowing.      The cardia and gastric fundus were normal on  retroflexion, bile in       stomach. Previously placed gastropexy site is identified in body of       stomach      The gastroesophageal junction and examined esophagus were normal. Impression:           - Normal duodenal bulb and second portion of the                        duodenum.                       - A Toupet fundoplication was found. The wrap appears                        tight. Dilated.                       - Normal gastroesophageal junction and esophagus.                       - No specimens collected. Recommendation:       - EGD as needed for dilation                       Proceed with colonoscopy, No upper GI source of                        bleeding identified Procedure Code(s):    --- Professional ---                       [phone], Esophagogastroduodenoscopy, flexible, transoral;                        with dilation of gastric/duodenal stricture(s) (eg,                        balloon, bougie) Diagnosis Code(s):    --- Professional ---                       N40.768, Other specified postprocedural states                       R13.10, Dysphagia, unspecified CPT copyright 2016 American Medical Association. All rights reserved. The codes documented in this report are preliminary and upon coder review may  be revised to meet current compliance requirements. Dr. Ulyess Mort Lin Landsman MD, MD 04/07/2017 10:03:03 AM This report has been signed  electronically. Number of Addenda: 0 Note Initiated On: 04/07/2017 9:21 AM      [facility]

## 2017-04-07 NOTE — Progress Notes (Signed)
[place] [facility] [place] NAME: Pamela Hendrix    MR#:  [id number]  DATE OF BIRTH:  August 03, 1945  SUBJECTIVE:   Patient's hgb dropped this am received 1 unit blood  REVIEW OF SYSTEMS:    Review of Systems  Constitutional: Negative for fever, chills weight loss HENT: Negative for ear pain, nosebleeds, congestion, facial swelling, rhinorrhea, neck pain, neck stiffness and ear discharge.   Respiratory: Negative for cough, shortness of breath, wheezing  Cardiovascular: Negative for chest pain, palpitations and leg swelling.  Gastrointestinal: Negative for heartburn, abdominal pain, vomiting, diarrhea or consitpation ++rectal bleed Genitourinary: Negative for dysuria, urgency, frequency, hematuria Musculoskeletal: Negative for back pain or joint pain Neurological: Negative for dizziness, seizures, syncope, focal weakness,  numbness and headaches.  Hematological: Does not bruise/bleed easily.  Psychiatric/Behavioral: Negative for hallucinations, confusion, dysphoric mood    Tolerating Diet: yes      DRUG ALLERGIES:   Allergies  Allergen Reactions  . Codeine Nausea Only    VITALS:  Blood pressure (!) 116/57, pulse 74, temperature (!) 96.8 F (36 C), temperature source Tympanic, resp. rate 17, height 5\' 3"  (1.6 m), weight 69.4 kg (153 lb), SpO2 100 %.  PHYSICAL EXAMINATION:  Constitutional: Appears well-developed and well-nourished. No distress. HENT: Normocephalic. Marland Kitchen Oropharynx is clear and moist.  Eyes: Conjunctivae and EOM are normal. PERRLA, no scleral icterus.  Neck: Normal ROM. Neck supple. No JVD. No tracheal deviation. CVS: RRR, S1/S2 +, no murmurs, no gallops, no carotid bruit.  Pulmonary: Effort and breath sounds normal, no stridor, rhonchi, wheezes, rales.  Abdominal: Soft. BS +,  no distension, tenderness, rebound or guarding.  Musculoskeletal: Normal range of motion. No edema and no tenderness.  Neuro: Alert. CN 2-12  grossly intact. No focal deficits. Skin: Skin is warm and dry. No rash noted. Psychiatric: Normal mood and affect.      LABORATORY PANEL:   CBC Recent Labs  Lab 04/07/17 0417  WBC 6.1  HGB 6.7*  HCT 20.8*  PLT 112*   ------------------------------------------------------------------------------------------------------------------  Chemistries  Recent Labs  Lab 04/06/17 0921 04/07/17 0417  NA 136 142  K 3.8 3.7  CL 107 112*  CO2 23 25  GLUCOSE 160* 97  BUN 16 12  CREATININE 1.06* 0.90  CALCIUM 8.6* 7.9*  AST 17  --   ALT 9*  --   ALKPHOS 58  --   BILITOT 1.0  --    ------------------------------------------------------------------------------------------------------------------  Cardiac Enzymes No results for input(s): TROPONINI in the last 168 hours. ------------------------------------------------------------------------------------------------------------------  RADIOLOGY:  Nm Gi Blood Loss  Result Date: 04/06/2017 CLINICAL DATA:  GI bleed.  Bright red blood per rectum. EXAM: NUCLEAR MEDICINE GASTROINTESTINAL BLEEDING SCAN TECHNIQUE: Sequential abdominal images were obtained following intravenous administration of Tc-18m labeled red blood cells. RADIOPHARMACEUTICALS:  21.7 mCi Tc-40m in-vitro labeled red cells. COMPARISON:  No prior GI bleeding scans lower abdominal imaging. Chest CT 06/26/2014 reviewed FINDINGS: Imaging obtained for 2 hours. Curvilinear activity in the upper abdomen is likely free pertechnetate in the stomach. There is no evidence of accumulating radiotracer conforming to intestinal anatomy to suggest GI bleed. Bladder activity is noted in the pelvis. IMPRESSION: No scintigraphic evidence to localize site of GI bleed. Electronically Signed   By: Jeb Levering M.D.   On: 04/06/2017 22:18     ASSESSMENT AND PLAN:   71 y/o female with hx of HTN here with rectal bleed.   1. Rectal bleed:  Patient to undergo EGD/colonoscopy this am  2.  Acute blood loss anemia from rectal bleed Patient with 1 unit of blood this am Follow hgb Appreciate GI consult  3. Hypothyroid:Continue Synthroid  4. HLD: Continue statin       Management plans discussed with the patient and she is in agreement.  CODE STATUS: FULL  TOTAL TIME TAKING CARE OF THIS PATIENT: 30 minutes.     POSSIBLE D/C 1-2 days, DEPENDING ON CLINICAL CONDITION.   Jennfer Gassen M.D on 04/07/2017 at 11:09 AM  Between 7am to 6pm - Pager - [phone] After 6pm go to [email or web address] - password [email or web address] [facility]  Office  [phone]  CC: Primary care physician; Juline Patch, MD  Note: This dictation was prepared with Dragon dictation along with smaller phrase technology. Any transcriptional errors that result from this process are unintentional.

## 2017-04-07 NOTE — Progress Notes (Signed)
Pt admitted with severe rectal bleeding.  Hgb was 6.7. After 1u PRBC, Hgb came up to 8.7.  Had a upper endoscopy and colonoscopy this am.  Diverticulosis found.  Esophagus was also dilated.  Pt instructed not to take aspirin or NSAIDs.  Pt tolerated soft diet at lunch.  Is d/ced home.  Will review d/c instructions.  Nurse tech to remove IVs.  Husband will transport pt home.

## 2017-04-07 NOTE — Op Note (Addendum)
[facility] Patient Name: Pamela Hendrix Procedure Date: 04/07/2017 9:21 AM MRN: [id number] Account #: [id number] Date of Birth: Dec 03, 1945 Admit Type: Outpatient Age: 71 Room: [facility] ENDO ROOM 1 Gender: Female Note Status: Finalized Procedure:            Colonoscopy Indications:          Hematochezia Providers:            Lin Landsman MD, MD Referring MD:         Juline Patch, MD (Referring MD) Medicines:            Monitored Anesthesia Care Complications:        No immediate complications. Estimated blood loss: None. Procedure:            Pre-Anesthesia Assessment:                       - Prior to the procedure, a History and Physical was                        performed, and patient medications and allergies were                        reviewed. The patient is competent. The risks and                        benefits of the procedure and the sedation options and                        risks were discussed with the patient. All questions                        were answered and informed consent was obtained.                        Patient identification and proposed procedure were                        verified by the physician, the nurse, the                        anesthesiologist, the anesthetist and the technician in                        the pre-procedure area in the procedure room. Mental                        Status Examination: alert and oriented. Airway                        Examination: normal oropharyngeal airway and neck                        mobility. Respiratory Examination: clear to                        auscultation. CV Examination: normal. Prophylactic                        Antibiotics: The patient does not require prophylactic  antibiotics. Prior Anticoagulants: The patient has                        taken aspirin, last dose was 1 day prior to procedure.                        ASA Grade  Assessment: III - A patient with severe                        systemic disease. After reviewing the risks and                        benefits, the patient was deemed in satisfactory                        condition to undergo the procedure. The anesthesia plan                        was to use monitored anesthesia care (MAC). Immediately                        prior to administration of medications, the patient was                        re-assessed for adequacy to receive sedatives. The                        heart rate, respiratory rate, oxygen saturations, blood                        pressure, adequacy of pulmonary ventilation, and                        response to care were monitored throughout the                        procedure. The physical status of the patient was                        re-assessed after the procedure.                       After obtaining informed consent, the colonoscope was                        passed under direct vision. Throughout the procedure,                        the patient's blood pressure, pulse, and oxygen                        saturations were monitored continuously. The                        Colonoscope was introduced through the anus and                        advanced to the the terminal ileum. The colonoscopy was  performed without difficulty. The patient tolerated the                        procedure well. The quality of the bowel preparation                        was good. Findings:      The perianal exam findings include non-thrombosed external hemorrhoids.      The terminal ileum appeared normal, yellow bile is present. No fresh or       old blood is seen.      Liquid contents mixed with residual blood was found in the rectum, in       the sigmoid colon, in the descending colon and at the splenic flexure.      Multiple medium-mouthed diverticula were found in the sigmoid colon and       descending colon.       Non-bleeding external hemorrhoids were found during retroflexion. The       hemorrhoids were large.      Normal mucosa was found in the entire colon. Impression:           - Non-thrombosed external hemorrhoids found on perianal                        exam.                       - The examined portion of the ileum was normal.                       - Residual blood in the rectum, in the sigmoid colon,                        in the descending colon and at the splenic flexure.                       - Diverticulosis in the sigmoid colon and in the                        descending colon, likely source of bleeding.                       - Non-bleeding external hemorrhoids.                       - No specimens collected. Recommendation:       - Return patient to hospital ward for possible                        discharge same day.                       - Advance diet as tolerated today.                       - Continue present medications.                       - Avoid constipation                       - No further GI work up is needed                       -  Avoid NSAIDs Procedure Code(s):    --- Professional ---                       [phone], Colonoscopy, flexible; diagnostic, including                        collection of specimen(s) by brushing or washing, when                        performed (separate procedure) Diagnosis Code(s):    --- Professional ---                       K64.4, Residual hemorrhoidal skin tags                       K62.5, Hemorrhage of anus and rectum                       K92.2, Gastrointestinal hemorrhage, unspecified                       K92.1, Melena (includes Hematochezia)                       K57.30, Diverticulosis of large intestine without                        perforation or abscess without bleeding CPT copyright 2016 American Medical Association. All rights reserved. The codes documented in this report are preliminary and upon coder review may  be  revised to meet current compliance requirements. Dr. Ulyess Mort Lin Landsman MD, MD 04/07/2017 10:46:39 AM This report has been signed electronically. Number of Addenda: 0 Note Initiated On: 04/07/2017 9:21 AM Scope Withdrawal Time: 0 hours 17 minutes 30 seconds  Total Procedure Duration: 0 hours 25 minutes 49 seconds       [facility]

## 2017-04-07 NOTE — Progress Notes (Signed)
Pt had NM bleeding scan prior to bedtime. Got back around 2200. GI consult performed. Scheduled for EGD and Colonoscopy today. Started bowel prep with Golytely. Had 2 bloody red watery stools with clots. EBL 200-300 mls so far. Timed Hgb. Pt to receive tap water enema this am prior to procedure. Pt has verbalized no complaints.Up to [facility] independently.

## 2017-04-07 NOTE — Progress Notes (Signed)
Pt only consumed 1/2 of the Golytely. Tap water enema was given this am. Pt couldn't hold it very long. Pt has only been passing red, watery stools from rectum with some clots present at times. No formed stool or any fragments present. Pt has been NPO since before midnight. Currently blood transfusing.

## 2017-04-07 NOTE — Anesthesia Preprocedure Evaluation (Signed)
Anesthesia Evaluation  Patient identified by MRN, date of birth, ID band Patient awake    Reviewed: Allergy & Precautions, NPO status , Patient's Chart, lab work & pertinent test results  Airway Mallampati: II  TM Distance: >3 FB     Dental  (+) Teeth Intact   Pulmonary neg pulmonary ROS,    Pulmonary exam normal        Cardiovascular hypertension, Pt. on medications Normal cardiovascular exam     Neuro/Psych  Headaches, negative psych ROS   GI/Hepatic Neg liver ROS, hiatal hernia, GERD  Medicated,  Endo/Other  Hypothyroidism   Renal/GU negative Renal ROS  negative genitourinary   Musculoskeletal negative musculoskeletal ROS (+)   Abdominal Normal abdominal exam  (+)   Peds negative pediatric ROS (+)  Hematology negative hematology ROS (+) anemia ,   Anesthesia Other Findings   Reproductive/Obstetrics                             Anesthesia Physical Anesthesia Plan  ASA: III  Anesthesia Plan: General   Post-op Pain Management:    Induction: Intravenous  PONV Risk Score and Plan:   Airway Management Planned: Nasal Cannula  Additional Equipment:   Intra-op Plan:   Post-operative Plan:   Informed Consent: I have reviewed the patients History and Physical, chart, labs and discussed the procedure including the risks, benefits and alternatives for the proposed anesthesia with the patient or authorized representative who has indicated his/her understanding and acceptance.   Dental advisory given  Plan Discussed with: CRNA and Surgeon  Anesthesia Plan Comments:         Anesthesia Quick Evaluation

## 2017-04-07 NOTE — Discharge Summary (Signed)
[place] [facility] [place] NAME: Pamela Hendrix    MR#:  [id number]  DATE OF BIRTH:  1946/03/13  DATE OF ADMISSION:  04/06/2017 ADMITTING PHYSICIAN: Bettey Costa, MD  DATE OF DISCHARGE: 04/07/2017  PRIMARY CARE PHYSICIAN: Juline Patch, MD    ADMISSION DIAGNOSIS:  Rectal bleeding [K62.5] GIB (gastrointestinal bleeding) [K92.2]  DISCHARGE DIAGNOSIS:  Active Problems:   Rectal bleed   GIB (gastrointestinal bleeding)   SECONDARY DIAGNOSIS:   Past Medical History:  Diagnosis Date  . Allergy   . GERD (gastroesophageal reflux disease)   . Hypertension   . Thyroid disease     HOSPITAL COURSE:   71 y/o female with hx of HTN here with rectal bleed.   1. Rectal bleed due to diverticular bleed. She underwent EGD and colonoscopy  EGD findings: - Normal duodenal bulb and second portion of the duodenum.  - A Toupet fundoplication was found.  The wrap appears tight.  Dilated.  - Normal gastroesophageal junction and esophagus.   Colonoscopy findings: - The examined portion of the ileum was normal.  - Residual blood in the rectum, in the sigmoid colon, in the descending colon and at the splenic flexure.  - Diverticulosis in the sigmoid colon and in the descending colon, likely source of bleeding.  - Non-bleeding external hemorrhoids.  She will follow up with GI 4-6 weeks   2. Acute blood loss anemia from rectal bleed Patient received 1 unit of blood this am Follow up hgb was 8.7.  3. Hypothyroid:Continue Synthroid  4. HLD: Continue statin      DISCHARGE CONDITIONS AND DIET:   Stable cardiac diet  CONSULTS OBTAINED:  Treatment Team:  Lin Landsman, MD  DRUG ALLERGIES:   Allergies  Allergen Reactions  . Codeine Nausea Only    DISCHARGE MEDICATIONS:   Current Discharge Medication List    CONTINUE these medications which have NOT CHANGED   Details  aspirin EC 81 MG tablet Take 81 mg by mouth daily.     cetirizine (ZYRTEC) 10 MG tablet Take 10 mg by mouth daily. otc    levothyroxine (SYNTHROID, LEVOTHROID) 100 MCG tablet Take 1 tablet (100 mcg total) by mouth daily. Qty: 90 tablet, Refills: 3   Associated Diagnoses: Hypothyroidism, unspecified type    lovastatin (MEVACOR) 20 MG tablet TAKE 1 TABLET BY MOUTH ONCE DAILY Qty: 30 tablet, Refills: 0   Associated Diagnoses: Mixed hyperlipidemia    montelukast (SINGULAIR) 10 MG tablet Take 1 tablet (10 mg total) by mouth daily. Qty: 90 tablet, Refills: 1    fluticasone (FLONASE) 50 MCG/ACT nasal spray Place 1 spray into both nostrils daily. Qty: 16 g, Refills: 11   Associated Diagnoses: Chronic allergic rhinitis, unspecified seasonality, unspecified trigger          Today   CHIEF COMPLAINT:   patient had rectal bleeding this am   VITAL SIGNS:  Blood pressure (!) 102/42, pulse 81, temperature 98 F (36.7 C), temperature source Oral, resp. rate 18, height 5\' 3"  (1.6 m), weight 69.4 kg (153 lb), SpO2 100 %.   REVIEW OF SYSTEMS:  Review of Systems  Constitutional: Negative.  Negative for chills, fever and malaise/fatigue.  HENT: Negative.  Negative for ear discharge, ear pain, hearing loss, nosebleeds and sore throat.   Eyes: Negative.  Negative for blurred vision and pain.  Respiratory: Negative.  Negative for cough, hemoptysis, shortness of breath and wheezing.   Cardiovascular: Negative.  Negative for chest pain, palpitations and leg  swelling.  Gastrointestinal: Positive for blood in stool. Negative for abdominal pain, diarrhea, nausea and vomiting.  Genitourinary: Negative.  Negative for dysuria.  Musculoskeletal: Negative.  Negative for back pain.  Skin: Negative.   Neurological: Negative for dizziness, tremors, speech change, focal weakness, seizures and headaches.  Endo/Heme/Allergies: Negative.  Does not bruise/bleed easily.  Psychiatric/Behavioral: Negative.  Negative for depression, hallucinations and suicidal ideas.      PHYSICAL EXAMINATION:  GENERAL:  71 y.o.-year-old patient lying in the bed with no acute distress.  NECK:  Supple, no jugular venous distention. No thyroid enlargement, no tenderness.  LUNGS: Normal breath sounds bilaterally, no wheezing, rales,rhonchi  No use of accessory muscles of respiration.  CARDIOVASCULAR: S1, S2 normal. No murmurs, rubs, or gallops.  ABDOMEN: Soft, non-tender, non-distended. Bowel sounds present. No organomegaly or mass.  EXTREMITIES: No pedal edema, cyanosis, or clubbing.  PSYCHIATRIC: The patient is alert and oriented x 3.  SKIN: No obvious rash, lesion, or ulcer.   DATA REVIEW:   CBC Recent Labs  Lab 04/07/17 0417 04/07/17 1223  WBC 6.1  --   HGB 6.7* 8.7*  HCT 20.8* 26.1*  PLT 112*  --     Chemistries  Recent Labs  Lab 04/06/17 0921 04/07/17 0417  NA 136 142  K 3.8 3.7  CL 107 112*  CO2 23 25  GLUCOSE 160* 97  BUN 16 12  CREATININE 1.06* 0.90  CALCIUM 8.6* 7.9*  AST 17  --   ALT 9*  --   ALKPHOS 58  --   BILITOT 1.0  --     Cardiac Enzymes No results for input(s): TROPONINI in the last 168 hours.  Microbiology Results  @[email or web address] @  RADIOLOGY:  Nm Gi Blood Loss  Result Date: 04/06/2017 CLINICAL DATA:  GI bleed.  Bright red blood per rectum. EXAM: NUCLEAR MEDICINE GASTROINTESTINAL BLEEDING SCAN TECHNIQUE: Sequential abdominal images were obtained following intravenous administration of Tc-42m labeled red blood cells. RADIOPHARMACEUTICALS:  21.7 mCi Tc-51m in-vitro labeled red cells. COMPARISON:  No prior GI bleeding scans lower abdominal imaging. Chest CT 06/26/2014 reviewed FINDINGS: Imaging obtained for 2 hours. Curvilinear activity in the upper abdomen is likely free pertechnetate in the stomach. There is no evidence of accumulating radiotracer conforming to intestinal anatomy to suggest GI bleed. Bladder activity is noted in the pelvis. IMPRESSION: No scintigraphic evidence to localize site of GI bleed. Electronically Signed    By: Jeb Levering M.D.   On: 04/06/2017 22:18      Current Discharge Medication List    CONTINUE these medications which have NOT CHANGED   Details  aspirin EC 81 MG tablet Take 81 mg by mouth daily.    cetirizine (ZYRTEC) 10 MG tablet Take 10 mg by mouth daily. otc    levothyroxine (SYNTHROID, LEVOTHROID) 100 MCG tablet Take 1 tablet (100 mcg total) by mouth daily. Qty: 90 tablet, Refills: 3   Associated Diagnoses: Hypothyroidism, unspecified type    lovastatin (MEVACOR) 20 MG tablet TAKE 1 TABLET BY MOUTH ONCE DAILY Qty: 30 tablet, Refills: 0   Associated Diagnoses: Mixed hyperlipidemia    montelukast (SINGULAIR) 10 MG tablet Take 1 tablet (10 mg total) by mouth daily. Qty: 90 tablet, Refills: 1    fluticasone (FLONASE) 50 MCG/ACT nasal spray Place 1 spray into both nostrils daily. Qty: 16 g, Refills: 11   Associated Diagnoses: Chronic allergic rhinitis, unspecified seasonality, unspecified trigger          Management plans discussed with the patient and she is in  agreement. Stable for discharge home  Patient should follow up with pcp and gi  CODE STATUS:     Code Status Orders  (From admission, onward)        Start     Ordered   04/06/17 1230  Full code  Continuous     04/06/17 1229    Code Status History    Date Active Date Inactive Code Status Order ID Comments User Context   This patient has a current code status but no historical code status.    Advance Directive Documentation     Most Recent Value  Type of Advance Directive  Healthcare Power of Attorney, Living will  Pre-existing out of facility DNR order (yellow form or pink MOST form)  No data  "MOST" Form in Place?  No data      TOTAL TIME TAKING CARE OF THIS PATIENT: 37 minutes.    Note: This dictation was prepared with Dragon dictation along with smaller phrase technology. Any transcriptional errors that result from this process are unintentional.  Bobbijo Holst M.D on 04/07/2017 at  3:10 PM  Between 7am to 6pm - Pager - [phone] After 6pm go to [email or web address] - password [email or web address] [facility]  Office  [phone]  CC: Primary care physician; Juline Patch, MD

## 2017-04-07 NOTE — Care Management Obs Status (Signed)
[place] NOTIFICATION   Patient Details  Name: Pamela Hendrix MRN: [id number] Date of Birth: May 16, 1946   Medicare Observation Status Notification Given:  Yes    Shelbie Ammons, RN 04/07/2017, 9:16 AM

## 2017-04-07 NOTE — Anesthesia Post-op Follow-up Note (Signed)
Anesthesia QCDR form completed.        

## 2017-04-08 LAB — TYPE AND SCREEN
ABO/RH(D): A POS
Antibody Screen: NEGATIVE
Unit division: 0
Unit division: 0

## 2017-04-08 LAB — BPAM RBC
Blood Product Expiration Date: 201812052359
Blood Product Expiration Date: 201812102359
ISSUE DATE / TIME: 201811230554
Unit Type and Rh: 6200
Unit Type and Rh: 6200

## 2017-04-09 ENCOUNTER — Other Ambulatory Visit: Payer: Self-pay | Admitting: Gastroenterology

## 2017-04-09 DIAGNOSIS — D518 Other vitamin B12 deficiency anemias: Secondary | ICD-10-CM

## 2017-04-09 MED ORDER — CYANOCOBALAMIN 1000 MCG/ML IJ SOLN: 1000.0000 ug | Freq: Once | INTRAMUSCULAR | 0 refills | Status: AC

## 2017-04-10 ENCOUNTER — Encounter: Payer: Self-pay | Admitting: Gastroenterology

## 2017-04-10 ENCOUNTER — Ambulatory Visit (INDEPENDENT_AMBULATORY_CARE_PROVIDER_SITE_OTHER): Payer: Medicare HMO

## 2017-04-10 DIAGNOSIS — D519 Vitamin B12 deficiency anemia, unspecified: Secondary | ICD-10-CM | POA: Diagnosis not present

## 2017-04-10 MED ORDER — CYANOCOBALAMIN 1000 MCG/ML IJ SOLN
1000.0000 ug | Freq: Once | INTRAMUSCULAR | Status: AC
  Administered 2017-04-10: 1000 ug via INTRAMUSCULAR

## 2017-04-13 ENCOUNTER — Other Ambulatory Visit: Payer: Self-pay

## 2017-04-13 DIAGNOSIS — E611 Iron deficiency: Secondary | ICD-10-CM

## 2017-04-13 MED ORDER — FERROUS SULFATE 325 (65 FE) MG PO TABS: 325.0000 mg | ORAL_TABLET | Freq: Every day | ORAL | 3 refills | Status: DC

## 2017-04-17 ENCOUNTER — Ambulatory Visit (INDEPENDENT_AMBULATORY_CARE_PROVIDER_SITE_OTHER): Payer: Medicare HMO

## 2017-04-17 DIAGNOSIS — E538 Deficiency of other specified B group vitamins: Secondary | ICD-10-CM | POA: Diagnosis not present

## 2017-04-17 MED ORDER — CYANOCOBALAMIN 1000 MCG/ML IJ SOLN
1000.0000 ug | Freq: Once | INTRAMUSCULAR | Status: AC
  Administered 2017-04-17: 1000 ug via INTRAMUSCULAR

## 2017-04-20 ENCOUNTER — Inpatient Hospital Stay: Payer: Self-pay | Admitting: Family Medicine

## 2017-04-22 ENCOUNTER — Other Ambulatory Visit: Payer: Self-pay | Admitting: Family Medicine

## 2017-04-22 DIAGNOSIS — E782 Mixed hyperlipidemia: Secondary | ICD-10-CM

## 2017-04-26 ENCOUNTER — Ambulatory Visit (INDEPENDENT_AMBULATORY_CARE_PROVIDER_SITE_OTHER): Payer: Medicare HMO | Admitting: Family Medicine

## 2017-04-26 ENCOUNTER — Encounter: Payer: Self-pay | Admitting: Family Medicine

## 2017-04-26 VITALS — BP 120/78 | HR 60 | Ht 63.0 in | Wt 150.0 lb

## 2017-04-26 DIAGNOSIS — D519 Vitamin B12 deficiency anemia, unspecified: Secondary | ICD-10-CM | POA: Diagnosis not present

## 2017-04-26 DIAGNOSIS — D500 Iron deficiency anemia secondary to blood loss (chronic): Secondary | ICD-10-CM | POA: Diagnosis not present

## 2017-04-26 DIAGNOSIS — K5731 Diverticulosis of large intestine without perforation or abscess with bleeding: Secondary | ICD-10-CM | POA: Diagnosis not present

## 2017-04-26 MED ORDER — CYANOCOBALAMIN 1000 MCG/ML IJ SOLN
1000.0000 ug | Freq: Once | INTRAMUSCULAR | Status: AC
  Administered 2017-04-26: 1000 ug via INTRAMUSCULAR

## 2017-04-26 NOTE — Progress Notes (Signed)
Name: Pamela Hendrix   MRN: [id number]    DOB: 09-Jun-1945   Date:04/26/2017       Progress Note  Subjective  Chief Complaint  Chief Complaint  Patient presents with  . Follow-up    hospital d/c on 24th of November.- Dx- diverticulosis  . Anemia    can't take ferrous sulfate due to " makes me have abdominal pain"    Anemia  Presents for follow-up visit. Symptoms include bruises/bleeds easily. There has been no abdominal pain, anorexia, confusion, fever, leg swelling, light-headedness, malaise/fatigue, pallor, palpitations, paresthesias, pica or weight loss. Signs of blood loss that are present include hematochezia. Signs of blood loss that are not present include hematemesis, melena, menorrhagia and vaginal bleeding. There are no compliance problems.     No problem-specific Assessment & Plan notes found for this encounter.   Past Medical History:  Diagnosis Date  . Allergy   . GERD (gastroesophageal reflux disease)   . Hypertension   . Thyroid disease     Past Surgical History:  Procedure Laterality Date  . CHOLECYSTECTOMY    . COLONOSCOPY  06/02/2014   cleared for 10 yrs- Dr Rayann Heman  . COLONOSCOPY N/A 04/07/2017   Procedure: COLONOSCOPY;  Surgeon: Lin Landsman, MD;  Location: [facility] ENDOSCOPY;  Service: Gastroenterology;  Laterality: N/A;  . ESOPHAGEAL DILATION    . ESOPHAGOGASTRODUODENOSCOPY N/A 04/07/2017   Procedure: ESOPHAGOGASTRODUODENOSCOPY (EGD);  Surgeon: Lin Landsman, MD;  Location: [facility] ENDOSCOPY;  Service: Gastroenterology;  Laterality: N/A;  . TONSILLECTOMY    . UPPER GI ENDOSCOPY  06/02/2014    Family History  Problem Relation Age of Onset  . Breast cancer Neg Hx     Social History   Socioeconomic History  . Marital status: Married    Spouse name: Not on file  . Number of children: Not on file  . Years of education: Not on file  . Highest education level: Not on file  Social Needs  . Financial resource strain: Not on file  .  Food insecurity - worry: Not on file  . Food insecurity - inability: Not on file  . Transportation needs - medical: Not on file  . Transportation needs - non-medical: Not on file  Occupational History  . Not on file  Tobacco Use  . Smoking status: Never Smoker  . Smokeless tobacco: Never Used  Substance and Sexual Activity  . Alcohol use: No    Alcohol/week: 0.0 oz  . Drug use: No  . Sexual activity: No  Other Topics Concern  . Not on file  Social History Narrative  . Not on file    Allergies  Allergen Reactions  . Codeine Nausea Only    Outpatient Medications Prior to Visit  Medication Sig Dispense Refill  . cetirizine (ZYRTEC) 10 MG tablet Take 10 mg by mouth daily. otc    . fluticasone (FLONASE) 50 MCG/ACT nasal spray Place 1 spray into both nostrils daily. 16 g 11  . levothyroxine (SYNTHROID, LEVOTHROID) 100 MCG tablet Take 1 tablet (100 mcg total) by mouth daily. 90 tablet 3  . lovastatin (MEVACOR) 20 MG tablet TAKE 1 TABLET BY MOUTH ONCE DAILY 30 tablet 0  . montelukast (SINGULAIR) 10 MG tablet Take 1 tablet (10 mg total) by mouth daily. 90 tablet 1  . Calcium Carbonate-Vitamin D (CALCIUM PLUS VITAMIN D PO) Take 1 tablet by mouth daily.    . ferrous sulfate (FERROUSUL) 325 (65 FE) MG tablet Take 1 tablet (325 mg total) by mouth daily  with breakfast. (Patient not taking: Reported on 04/26/2017) 30 tablet 3  . aspirin EC 81 MG tablet Take 81 mg by mouth daily.     No facility-administered medications prior to visit.     Review of Systems  Constitutional: Negative for chills, fever, malaise/fatigue and weight loss.  HENT: Negative for ear discharge, ear pain and sore throat.   Eyes: Negative for blurred vision.  Respiratory: Negative for cough, sputum production, shortness of breath and wheezing.   Cardiovascular: Negative for chest pain, palpitations and leg swelling.  Gastrointestinal: Positive for hematochezia. Negative for abdominal pain, anorexia, blood in stool,  constipation, diarrhea, heartburn, hematemesis, melena and nausea.  Genitourinary: Negative for dysuria, frequency, hematuria, menorrhagia, urgency and vaginal bleeding.  Musculoskeletal: Negative for back pain, joint pain, myalgias and neck pain.  Skin: Negative for pallor and rash.  Neurological: Negative for dizziness, tingling, sensory change, focal weakness, light-headedness, headaches and paresthesias.  Endo/Heme/Allergies: Negative for environmental allergies and polydipsia. Bruises/bleeds easily.  Psychiatric/Behavioral: Negative for confusion, depression and suicidal ideas. The patient is not nervous/anxious and does not have insomnia.      Objective  Vitals:   04/26/17 1028  BP: 120/78  Pulse: 60  Weight: 150 lb (68 kg)  Height: 5\' 3"  (1.6 m)    Physical Exam  Constitutional: She is well-developed, well-nourished, and in no distress. No distress.  HENT:  Head: Normocephalic and atraumatic.  Right Ear: External ear normal.  Left Ear: External ear normal.  Nose: Nose normal.  Mouth/Throat: Oropharynx is clear and moist.  Eyes: Conjunctivae and EOM are normal. Pupils are equal, round, and reactive to light. Right eye exhibits no discharge. Left eye exhibits no discharge.  Neck: Normal range of motion. Neck supple. No JVD present. No thyromegaly present.  Cardiovascular: Normal rate, regular rhythm, normal heart sounds and intact distal pulses. Exam reveals no gallop and no friction rub.  No murmur heard. Pulmonary/Chest: Effort normal and breath sounds normal. She has no wheezes. She has no rales.  Abdominal: Soft. Bowel sounds are normal. She exhibits no mass. There is no hepatosplenomegaly. There is no tenderness. There is no guarding and no CVA tenderness.  Musculoskeletal: Normal range of motion. She exhibits no edema.  Lymphadenopathy:    She has no cervical adenopathy.  Neurological: She is alert. She has normal reflexes.  Skin: Skin is warm, dry and intact. She is  not diaphoretic. No pallor.  Psychiatric: Mood and affect normal.  Nursing note and vitals reviewed.     Assessment & Plan  Problem List Items Addressed This Visit    None    Visit Diagnoses    Iron deficiency anemia due to chronic blood loss    -  Primary   Relevant Medications   cyanocobalamin ((VITAMIN B-12)) injection 1,000 mcg (Completed) (Start on 04/26/2017 11:15 AM)   Other Relevant Orders   Hemoglobin   Ferritin   Anemia due to vitamin B12 deficiency, unspecified B12 deficiency type       Relevant Medications   cyanocobalamin ((VITAMIN B-12)) injection 1,000 mcg (Completed) (Start on 04/26/2017 11:15 AM)   Other Relevant Orders   Hemoglobin   Ferritin   Diverticulosis of large intestine with hemorrhage       currently stable/hemoccults x 3   Relevant Orders   Hemoglobin   Ferritin      Meds ordered this encounter  Medications  . cyanocobalamin ((VITAMIN B-12)) injection 1,000 mcg      Dr. Otilio Miu [facility]  04/26/17

## 2017-04-26 NOTE — Patient Instructions (Signed)
Iron-Rich Diet Iron is a mineral that helps your body to produce hemoglobin. Hemoglobin is a protein in your red blood cells that carries oxygen to your body's tissues. Eating too little iron may cause you to feel weak and tired, and it can increase your risk for infection. Eating enough iron is necessary for your body's metabolism, muscle function, and nervous system. Iron is naturally found in many foods. It can also be added to foods or fortified in foods. There are two types of dietary iron:  Heme iron. Heme iron is absorbed by the body more easily than nonheme iron. Heme iron is found in meat, poultry, and fish.  Nonheme iron. Nonheme iron is found in dietary supplements, iron-fortified grains, beans, and vegetables.  You may need to follow an iron-rich diet if:  You have been diagnosed with iron deficiency or iron-deficiency anemia.  You have a condition that prevents you from absorbing dietary iron, such as: ? Infection in your intestines. ? Celiac disease. This involves long-lasting (chronic) inflammation of your intestines.  You do not eat enough iron.  You eat a diet that is high in foods that impair iron absorption.  You have lost a lot of blood.  You have heavy bleeding during your menstrual cycle.  You are pregnant.  What is my plan? Your health care provider may help you to determine how much iron you need per day based on your condition. Generally, when a person consumes sufficient amounts of iron in the diet, the following iron needs are met:  Men. ? 14-18 years old: 11 mg per day. ? 19-50 years old: 8 mg per day.  Women. ? 14-18 years old: 15 mg per day. ? 19-50 years old: 18 mg per day. ? Over 50 years old: 8 mg per day. ? Pregnant women: 27 mg per day. ? Breastfeeding women: 9 mg per day.  What do I need to know about an iron-rich diet?  Eat fresh fruits and vegetables that are high in vitamin C along with foods that are high in iron. This will help  increase the amount of iron that your body absorbs from food, especially with foods containing nonheme iron. Foods that are high in vitamin C include oranges, peppers, tomatoes, and mango.  Take iron supplements only as directed by your health care provider. Overdose of iron can be life-threatening. If you were prescribed iron supplements, take them with orange juice or a vitamin C supplement.  Cook foods in pots and pans that are made from iron.  Eat nonheme iron-containing foods alongside foods that are high in heme iron. This helps to improve your iron absorption.  Certain foods and drinks contain compounds that impair iron absorption. Avoid eating these foods in the same meal as iron-rich foods or with iron supplements. These include: ? Coffee, black tea, and red wine. ? Milk, dairy products, and foods that are high in calcium. ? Beans, soybeans, and peas. ? Whole grains.  When eating foods that contain both nonheme iron and compounds that impair iron absorption, follow these tips to absorb iron better. ? Soak beans overnight before cooking. ? Soak whole grains overnight and drain them before using. ? Ferment flours before baking, such as using yeast in bread dough. What foods can I eat? Grains Iron-fortified breakfast cereal. Iron-fortified whole-wheat bread. Enriched rice. Sprouted grains. Vegetables Spinach. Potatoes with skin. Green peas. Broccoli. Red and green bell peppers. Fermented vegetables. Fruits Prunes. Raisins. Oranges. Strawberries. Mango. Grapefruit. Meats and Other Protein Sources   Beef liver. Oysters. Beef. Shrimp. [place]. Chicken. [place]. Sardines. Chickpeas. Nuts. Tofu. Beverages Tomato juice. Fresh orange juice. Prune juice. Hibiscus tea. Fortified instant breakfast shakes. Condiments Tahini. Fermented soy sauce. Sweets and Desserts Black-strap molasses. Other Wheat germ. The items listed above may not be a complete list of recommended foods or beverages.  Contact your dietitian for more options. What foods are not recommended? Grains Whole grains. Bran cereal. Bran flour. Oats. Vegetables Artichokes. Brussels sprouts. Kale. Fruits Blueberries. Raspberries. Strawberries. Figs. Meats and Other Protein Sources Soybeans. Products made from soy protein. Dairy Milk. Cream. Cheese. Yogurt. Cottage cheese. Beverages Coffee. Black tea. Red wine. Sweets and Desserts Cocoa. Chocolate. Ice cream. Other Basil. Oregano. Parsley. The items listed above may not be a complete list of foods and beverages to avoid. Contact your dietitian for more information. This information is not intended to replace advice given to you by your health care provider. Make sure you discuss any questions you have with your health care provider. Document Released: 12/14/2004 Document Revised: 11/20/2015 Document Reviewed: 11/27/2013 Elsevier Interactive Patient Education  [employer].

## 2017-04-27 LAB — FERRITIN: Ferritin: 115 ng/mL (ref 15–150)

## 2017-04-27 LAB — HEMOGLOBIN: Hemoglobin: 10.8 g/dL — ABNORMAL LOW (ref 11.1–15.9)

## 2017-04-28 ENCOUNTER — Other Ambulatory Visit (INDEPENDENT_AMBULATORY_CARE_PROVIDER_SITE_OTHER): Payer: Medicare HMO

## 2017-04-28 DIAGNOSIS — D519 Vitamin B12 deficiency anemia, unspecified: Secondary | ICD-10-CM | POA: Diagnosis not present

## 2017-04-28 LAB — HEMOCCULT GUIAC POC 1CARD (OFFICE)
Card #2 Fecal Occult Blod, POC: NEGATIVE
Card #3 Fecal Occult Blood, POC: NEGATIVE
Fecal Occult Blood, POC: NEGATIVE

## 2017-04-28 NOTE — Progress Notes (Signed)
Not seen by doctor

## 2017-05-01 ENCOUNTER — Inpatient Hospital Stay: Payer: Self-pay | Admitting: Family Medicine

## 2017-05-03 ENCOUNTER — Ambulatory Visit (INDEPENDENT_AMBULATORY_CARE_PROVIDER_SITE_OTHER): Payer: Medicare HMO

## 2017-05-03 DIAGNOSIS — D519 Vitamin B12 deficiency anemia, unspecified: Secondary | ICD-10-CM

## 2017-05-03 MED ORDER — CYANOCOBALAMIN 1000 MCG/ML IJ SOLN
1000.0000 ug | Freq: Once | INTRAMUSCULAR | Status: AC
  Administered 2017-05-03: 1000 ug via INTRAMUSCULAR

## 2017-05-10 ENCOUNTER — Ambulatory Visit (INDEPENDENT_AMBULATORY_CARE_PROVIDER_SITE_OTHER): Payer: Medicare HMO

## 2017-05-10 DIAGNOSIS — D519 Vitamin B12 deficiency anemia, unspecified: Secondary | ICD-10-CM

## 2017-05-10 MED ORDER — CYANOCOBALAMIN 1000 MCG/ML IJ SOLN
1000.0000 ug | Freq: Once | INTRAMUSCULAR | Status: AC
  Administered 2017-05-10: 1000 ug via INTRAMUSCULAR

## 2017-05-17 ENCOUNTER — Other Ambulatory Visit: Payer: Medicare HMO

## 2017-05-17 ENCOUNTER — Other Ambulatory Visit: Payer: Self-pay

## 2017-05-17 DIAGNOSIS — D649 Anemia, unspecified: Secondary | ICD-10-CM

## 2017-05-18 ENCOUNTER — Ambulatory Visit: Payer: Medicare HMO | Admitting: Gastroenterology

## 2017-05-18 ENCOUNTER — Encounter: Payer: Self-pay | Admitting: Gastroenterology

## 2017-05-18 ENCOUNTER — Other Ambulatory Visit
Admission: RE | Admit: 2017-05-18 | Discharge: 2017-05-18 | Disposition: A | Discharge status: Home / Self Care | Payer: Medicare HMO | Source: Ambulatory Visit | Attending: Gastroenterology | Admitting: Gastroenterology

## 2017-05-18 VITALS — BP 137/85 | HR 67 | Temp 98.6°F | Ht 63.0 in | Wt 152.0 lb

## 2017-05-18 DIAGNOSIS — K921 Melena: Secondary | ICD-10-CM

## 2017-05-18 DIAGNOSIS — D518 Other vitamin B12 deficiency anemias: Secondary | ICD-10-CM

## 2017-05-18 DIAGNOSIS — Z8719 Personal history of other diseases of the digestive system: Secondary | ICD-10-CM

## 2017-05-18 DIAGNOSIS — D519 Vitamin B12 deficiency anemia, unspecified: Secondary | ICD-10-CM | POA: Insufficient documentation

## 2017-05-18 LAB — HEMOGLOBIN: Hemoglobin: 11.3 g/dL (ref 11.1–15.9)

## 2017-05-18 LAB — VITAMIN B12: Vitamin B-12: 1007 pg/mL — ABNORMAL HIGH (ref 180–914)

## 2017-05-18 NOTE — Progress Notes (Signed)
Cephas Darby, MD [address]  [place]  [place], [place] [address]  Main: [phone]  Fax: [phone]    Gastroenterology Consultation  Referring Provider:     Juline Patch, MD Primary Care Physician:  Juline Patch, MD Primary Gastroenterologist:  Dr. Cephas Darby Reason for Consultation:     Hematochezia        HPI:   Pamela Hendrix is a 72 y.o. female referred by Dr. Juline Patch, MD  for consultation & management of hematochezia. She is here for hospital follow-up. She has history of GERD, large hiatal hernia status post laparoscopic repair and Toupet fundoplication and PEG tube for gastropexy at [facility] by Dr. Margart Sickles on 08/28/2014.  The PEG tube was removed 6 weeks after surgery.  Apparently, she was having episodes of dysphagia and emesis, underwent multiple upper endoscopies with empiric balloon dilation to 20 mm as of 03/2015.  she presented on 04/06/2017 to [facility] with one-day history of several episodes of hematochezia, significant drop in hemoglobin and symptomatic anemia. She had NM bleeding scan which was unremarkable. She underwent EGD which was negative for acute blood loss and colonoscopy revealed residual blood in the left colon and severe diverticulosis. Terminal ileum was normal. She received blood transfusions and her hemoglobin responded appropriately.    Interval history: 05/18/2017   She had a follow-up hemoglobin as outpatient yesterday and it improved. She had low B12, normal folate and ferritin levels. She is receiving B12 injections, received 5 shots so far. She stopped ASA81 since discharge. She is taking oral iron and miralax to avoid constipation secondary to iron.   NSAIDs: none  Antiplts/Anticoagulants/Anti thrombotics: None  GI Procedures:  Colonoscopy at [place] in 05/2014, was reportedly normal  EGD findings: 04/07/2017 - Normal duodenal bulb and second portion of the duodenum.  - A Toupet  fundoplication was found.  The wrap appears tight.  Dilated.  - Normal gastroesophageal junction and esophagus.   Colonoscopy findings: 04/07/2017 - The examined portion of the ileum was normal.  - Residual blood in the rectum, in the sigmoid colon, in the descending colon and at the splenic flexure.  - Diverticulosis in the sigmoid colon and in the descending colon, likely source of bleeding.  - Non-bleeding external hemorrhoids.     Past Medical History:  Diagnosis Date  . Allergy   . GERD (gastroesophageal reflux disease)   . Hypertension   . Thyroid disease     Past Surgical History:  Procedure Laterality Date  . CHOLECYSTECTOMY    . COLONOSCOPY  06/02/2014   cleared for 10 yrs- Dr Rayann Heman  . COLONOSCOPY N/A 04/07/2017   Procedure: COLONOSCOPY;  Surgeon: Lin Landsman, MD;  Location: [facility] ENDOSCOPY;  Service: Gastroenterology;  Laterality: N/A;  . ESOPHAGEAL DILATION    . ESOPHAGOGASTRODUODENOSCOPY N/A 04/07/2017   Procedure: ESOPHAGOGASTRODUODENOSCOPY (EGD);  Surgeon: Lin Landsman, MD;  Location: [facility] ENDOSCOPY;  Service: Gastroenterology;  Laterality: N/A;  . TONSILLECTOMY    . UPPER GI ENDOSCOPY  06/02/2014     Current Outpatient Medications:  .  cetirizine (ZYRTEC) 10 MG tablet, Take 10 mg by mouth daily. otc, Disp: , Rfl:  .  ferrous sulfate (FERROUSUL) 325 (65 FE) MG tablet, Take 1 tablet (325 mg total) by mouth daily with breakfast., Disp: 30 tablet, Rfl: 3 .  fluticasone (FLONASE) 50 MCG/ACT nasal spray, Place 1 spray into both nostrils daily., Disp: 16 g, Rfl: 11 .  levothyroxine (SYNTHROID, LEVOTHROID) 100  MCG tablet, Take 1 tablet (100 mcg total) by mouth daily., Disp: 90 tablet, Rfl: 3 .  loratadine (CLARITIN) 10 MG tablet, Take 10 mg by mouth., Disp: , Rfl:  .  lovastatin (MEVACOR) 20 MG tablet, TAKE 1 TABLET BY MOUTH ONCE DAILY, Disp: 30 tablet, Rfl: 0 .  magnesium oxide (MAG-OX) 400 MG tablet, Take by mouth., Disp: , Rfl:  .  montelukast  (SINGULAIR) 10 MG tablet, Take 1 tablet (10 mg total) by mouth daily., Disp: 90 tablet, Rfl: 1 .  Multiple Vitamin (MULTI-VITAMINS) TABS, Take by mouth., Disp: , Rfl:  .  polyethylene glycol (MIRALAX / GLYCOLAX) packet, Take by mouth., Disp: , Rfl:  .  simethicone (MYLICON) 735 MG chewable tablet, Chew 125 mg by mouth., Disp: , Rfl:  .  Calcium-Magnesium-Vitamin D (CORAL CALCIUM) 185-50-100 MG-MG-UNIT CAPS, Take by mouth., Disp: , Rfl:  .  FLUZONE HIGH-DOSE 0.5 ML injection, ADM 0.5ML IM UTD, Disp: , Rfl: 0  Family History  Problem Relation Age of Onset  . Breast cancer Neg Hx      Social History   Tobacco Use  . Smoking status: Never Smoker  . Smokeless tobacco: Never Used  Substance Use Topics  . Alcohol use: No    Alcohol/week: 0.0 oz  . Drug use: No    Allergies as of 05/18/2017 - Review Complete 05/18/2017  Allergen Reaction Noted  . Codeine Nausea Only and Nausea And Vomiting 12/24/2013    Review of Systems:    All systems reviewed and negative except where noted in HPI.   Physical Exam:  BP 137/85   Pulse 67   Temp 98.6 F (37 C) (Oral)   Ht 5\' 3"  (1.6 m)   Wt 152 lb (68.9 kg)   BMI 26.93 kg/m  No LMP recorded. Patient is postmenopausal.  General:   Alert,  Well-developed, well-nourished, pleasant and cooperative in NAD Head:  Normocephalic and atraumatic. Eyes:  Sclera clear, no icterus.   Conjunctiva pink. Ears:  Normal auditory acuity. Nose:  No deformity, discharge, or lesions. Mouth:  No deformity or lesions,oropharynx pink & moist. Neck:  Supple; no masses or thyromegaly. Lungs:  Respirations even and unlabored.  Clear throughout to auscultation.   No wheezes, crackles, or rhonchi. No acute distress. Heart:  Regular rate and rhythm; no murmurs, clicks, rubs, or gallops. Abdomen:  Normal bowel sounds. Soft, non-tender and non-distended without masses, hepatosplenomegaly or hernias noted.  No guarding or rebound tenderness.   Rectal: Not performed Msk:   Symmetrical without gross deformities. Good, equal movement & strength bilaterally. Pulses:  Normal pulses noted. Extremities:  No clubbing or edema.  No cyanosis. Neurologic:  Alert and oriented x3;  grossly normal neurologically. Skin:  Intact without significant lesions or rashes. No jaundice. Lymph Nodes:  No significant cervical adenopathy. Psych:  Alert and cooperative. Normal mood and affect.  Imaging Studies: Reviewed  Assessment and Plan:   Pamela Hendrix is a 72 y.o. female with history of GERD, large hiatal hernia status post laparoscopic repair and Toupet fundoplication and PEG tube for gastropexy at [facility] by Dr. Margart Sickles on 08/28/2014. Recent admission for hematochezia secondary to diverticular bleed which currently resolved. She also has severe B12 deficiency. Hb improved  - She can stop oral iron - Check B12 levels today and supplement if needed   Follow up in 76months   Cephas Darby, MD

## 2017-05-25 ENCOUNTER — Telehealth: Payer: Self-pay | Admitting: Gastroenterology

## 2017-05-25 NOTE — Telephone Encounter (Signed)
Results for B12 test.

## 2017-05-25 NOTE — Telephone Encounter (Signed)
Informed pt. B12 level is back up and supplements are no longer needed//kdc

## 2017-05-30 DIAGNOSIS — R69 Illness, unspecified: Secondary | ICD-10-CM | POA: Diagnosis not present

## 2017-05-31 DIAGNOSIS — R69 Illness, unspecified: Secondary | ICD-10-CM | POA: Diagnosis not present

## 2017-06-26 ENCOUNTER — Other Ambulatory Visit: Payer: Self-pay | Admitting: Family Medicine

## 2017-06-26 DIAGNOSIS — E782 Mixed hyperlipidemia: Secondary | ICD-10-CM

## 2017-07-11 ENCOUNTER — Other Ambulatory Visit: Payer: Self-pay | Admitting: Family Medicine

## 2017-07-11 DIAGNOSIS — J309 Allergic rhinitis, unspecified: Secondary | ICD-10-CM

## 2017-07-18 ENCOUNTER — Other Ambulatory Visit: Payer: Self-pay | Admitting: Family Medicine

## 2017-07-18 DIAGNOSIS — E039 Hypothyroidism, unspecified: Secondary | ICD-10-CM

## 2017-07-20 ENCOUNTER — Ambulatory Visit (INDEPENDENT_AMBULATORY_CARE_PROVIDER_SITE_OTHER): Payer: Medicare HMO | Admitting: Family Medicine

## 2017-07-20 ENCOUNTER — Encounter: Payer: Self-pay | Admitting: Family Medicine

## 2017-07-20 VITALS — BP 132/62 | HR 92 | Temp 98.7°F | Ht 63.0 in | Wt 150.0 lb

## 2017-07-20 DIAGNOSIS — J0100 Acute maxillary sinusitis, unspecified: Secondary | ICD-10-CM

## 2017-07-20 MED ORDER — AZITHROMYCIN 250 MG PO TABS: ORAL_TABLET | ORAL | 0 refills | Status: DC

## 2017-07-20 NOTE — Progress Notes (Signed)
Name: Pamela Hendrix   MRN: [id number]    DOB: 04/29/1946   Date:07/20/2017       Progress Note  Subjective  Chief Complaint  Chief Complaint  Patient presents with  . Sinusitis    went shopping in the rain last week and started getting sick Sunday, hoarse, cough, cong- trying otc vicks cold and flu- not helping    Sinusitis  This is a new problem. The current episode started in the past 7 days (Saturday night). The problem has been gradually worsening since onset. There has been no fever. Her pain is at a severity of 2/10. The pain is mild ("glasses heavy"). Associated symptoms include congestion, coughing, headaches, a hoarse voice, sinus pressure, sneezing, a sore throat and swollen glands. Pertinent negatives include no chills, diaphoresis, ear pain, neck pain or shortness of breath. (Clear) Past treatments include oral decongestants (nyquil/daquil).  Cough  This is a new problem. The current episode started in the past 7 days. The problem has been waxing and waning. The cough is non-productive. Associated symptoms include headaches, postnasal drip and a sore throat. Pertinent negatives include no chest pain, chills, ear pain, fever, heartburn, hemoptysis, myalgias, nasal congestion, rash, shortness of breath, weight loss or wheezing. The symptoms are aggravated by cold air. There is no history of environmental allergies.    No problem-specific Assessment & Plan notes found for this encounter.   Past Medical History:  Diagnosis Date  . Allergy   . GERD (gastroesophageal reflux disease)   . Hypertension   . Thyroid disease     Past Surgical History:  Procedure Laterality Date  . CHOLECYSTECTOMY    . COLONOSCOPY  06/02/2014   cleared for 10 yrs- Dr Rayann Heman  . COLONOSCOPY N/A 04/07/2017   Procedure: COLONOSCOPY;  Surgeon: Lin Landsman, MD;  [facility] ENDOSCOPY;  Service: Gastroenterology;  Laterality: N/A;  . ESOPHAGEAL DILATION    . ESOPHAGOGASTRODUODENOSCOPY  N/A 04/07/2017   Procedure: ESOPHAGOGASTRODUODENOSCOPY (EGD);  Surgeon: Lin Landsman, MD;  Location: [facility] ENDOSCOPY;  Service: Gastroenterology;  Laterality: N/A;  . TONSILLECTOMY    . UPPER GI ENDOSCOPY  06/02/2014    Family History  Problem Relation Age of Onset  . Breast cancer Neg Hx     Social History   Socioeconomic History  . Marital status: Married    Spouse name: Not on file  . Number of children: Not on file  . Years of education: Not on file  . Highest education level: Not on file  Social Needs  . Financial resource strain: Not on file  . Food insecurity - worry: Not on file  . Food insecurity - inability: Not on file  . Transportation needs - medical: Not on file  . Transportation needs - non-medical: Not on file  Occupational History  . Not on file  Tobacco Use  . Smoking status: Never Smoker  . Smokeless tobacco: Never Used  Substance and Sexual Activity  . Alcohol use: No    Alcohol/week: 0.0 oz  . Drug use: No  . Sexual activity: No  Other Topics Concern  . Not on file  Social History Narrative  . Not on file    Allergies  Allergen Reactions  . Codeine Nausea Only and Nausea And Vomiting    Outpatient Medications Prior to Visit  Medication Sig Dispense Refill  . cetirizine (ZYRTEC) 10 MG tablet Take 10 mg by mouth daily. otc    . Cholecalciferol (VITAMIN D-3) 1000 units CAPS Take 1 capsule  by mouth daily.    . fluticasone (FLONASE) 50 MCG/ACT nasal spray Place 1 spray into both nostrils daily. 16 g 11  . levothyroxine (SYNTHROID, LEVOTHROID) 100 MCG tablet TAKE 1 TABLET BY MOUTH ONCE DAILY 30 tablet 0  . lovastatin (MEVACOR) 20 MG tablet TAKE 1 TABLET BY MOUTH ONCE DAILY 30 tablet 1  . montelukast (SINGULAIR) 10 MG tablet Take 1 tablet (10 mg total) by mouth daily. 90 tablet 1  . vitamin B-12 (CYANOCOBALAMIN) 1000 MCG tablet Take 1,000 mcg by mouth daily.    . Calcium-Magnesium-Vitamin D (CORAL CALCIUM) 185-50-100 MG-MG-UNIT CAPS Take by  mouth.    . ferrous sulfate (FERROUSUL) 325 (65 FE) MG tablet Take 1 tablet (325 mg total) by mouth daily with breakfast. (Patient not taking: Reported on 07/20/2017) 30 tablet 3  . loratadine (CLARITIN) 10 MG tablet Take 10 mg by mouth.    . polyethylene glycol (MIRALAX / GLYCOLAX) packet Take by mouth.    Marland Kitchen FLUZONE HIGH-DOSE 0.5 ML injection ADM 0.5ML IM UTD  0  . magnesium oxide (MAG-OX) 400 MG tablet Take by mouth.    . montelukast (SINGULAIR) 10 MG tablet TAKE 1 TABLET BY MOUTH ONCE DAILY 30 tablet 4  . Multiple Vitamin (MULTI-VITAMINS) TABS Take by mouth.    . simethicone (MYLICON) 371 MG chewable tablet Chew 125 mg by mouth.     No facility-administered medications prior to visit.     Review of Systems  Constitutional: Negative for chills, diaphoresis, fever, malaise/fatigue and weight loss.  HENT: Positive for congestion, hoarse voice, postnasal drip, sinus pressure, sneezing and sore throat. Negative for ear discharge and ear pain.   Eyes: Negative for blurred vision.  Respiratory: Positive for cough. Negative for hemoptysis, sputum production, shortness of breath and wheezing.   Cardiovascular: Negative for chest pain, palpitations and leg swelling.  Gastrointestinal: Negative for abdominal pain, blood in stool, constipation, diarrhea, heartburn, melena and nausea.  Genitourinary: Negative for dysuria, frequency, hematuria and urgency.  Musculoskeletal: Negative for back pain, joint pain, myalgias and neck pain.  Skin: Negative for rash.  Neurological: Positive for headaches. Negative for dizziness, tingling, sensory change and focal weakness.  Endo/Heme/Allergies: Negative for environmental allergies and polydipsia. Does not bruise/bleed easily.  Psychiatric/Behavioral: Negative for depression and suicidal ideas. The patient is not nervous/anxious and does not have insomnia.      Objective  Vitals:   07/20/17 1121  BP: 132/62  Pulse: 92  Temp: 98.7 F (37.1 C)  TempSrc:  Oral  Weight: 150 lb (68 kg)  Height: 5\' 3"  (1.6 m)    Physical Exam  Constitutional: She is well-developed, well-nourished, and in no distress. No distress.  HENT:  Head: Normocephalic and atraumatic.  Right Ear: Tympanic membrane, external ear and ear canal normal.  Left Ear: Tympanic membrane, external ear and ear canal normal.  Nose: Mucosal edema and rhinorrhea present. Right sinus exhibits maxillary sinus tenderness. Left sinus exhibits maxillary sinus tenderness.  Mouth/Throat: Oropharynx is clear and moist. No oropharyngeal exudate, posterior oropharyngeal edema or posterior oropharyngeal erythema.  Eyes: Conjunctivae and EOM are normal. Pupils are equal, round, and reactive to light. Right eye exhibits no discharge. Left eye exhibits no discharge.  Neck: Normal range of motion. Neck supple. No JVD present. No thyromegaly present.  Cardiovascular: Normal rate, regular rhythm, normal heart sounds and intact distal pulses. Exam reveals no gallop and no friction rub.  No murmur heard. Pulmonary/Chest: Effort normal and breath sounds normal. She has no wheezes. She has no  rales.  Abdominal: Soft. Bowel sounds are normal. She exhibits no mass. There is no tenderness. There is no guarding.  Musculoskeletal: Normal range of motion. She exhibits no edema.  Lymphadenopathy:    She has no cervical adenopathy.  Neurological: She is alert. She has normal reflexes.  Skin: Skin is warm and dry. She is not diaphoretic.  Psychiatric: Mood and affect normal.  Nursing note and vitals reviewed.     Assessment & Plan  Problem List Items Addressed This Visit    None    Visit Diagnoses    Acute maxillary sinusitis, recurrence not specified    -  Primary   Relevant Medications   azithromycin (ZITHROMAX) 250 MG tablet      Meds ordered this encounter  Medications  . azithromycin (ZITHROMAX) 250 MG tablet    Sig: 2 today then 1 a day for 4 days    Dispense:  6 tablet    Refill:  0       Dr. Macon [facility]  07/20/17

## 2017-08-20 ENCOUNTER — Other Ambulatory Visit: Payer: Self-pay | Admitting: Family Medicine

## 2017-08-20 DIAGNOSIS — E039 Hypothyroidism, unspecified: Secondary | ICD-10-CM

## 2017-08-21 ENCOUNTER — Encounter: Payer: Self-pay | Admitting: Gastroenterology

## 2017-08-21 ENCOUNTER — Ambulatory Visit: Payer: Medicare HMO | Admitting: Gastroenterology

## 2017-08-21 VITALS — BP 129/79 | HR 64 | Resp 16 | Ht 63.0 in | Wt 151.2 lb

## 2017-08-21 DIAGNOSIS — K5909 Other constipation: Secondary | ICD-10-CM

## 2017-08-21 NOTE — Progress Notes (Addendum)
Cephas Darby, MD [address]  [place]  [place], [place] [address]  Main: [phone]  Fax: [phone]    Gastroenterology Consultation  Referring Provider:     Juline Patch, MD Primary Care Physician:  Juline Patch, MD Primary Gastroenterologist:  Dr. Cephas Darby Reason for Consultation:    constipation        HPI:   Pamela Hendrix is a 72 y.o. female referred by Dr. Juline Patch, MD  for consultation & management of hematochezia. She is here for hospital follow-up. She has history of GERD, large hiatal hernia status post laparoscopic repair and Toupet fundoplication and PEG tube for gastropexy at [facility] by Dr. Margart Sickles on 08/28/2014.  The PEG tube was removed 6 weeks after surgery.  Apparently, she was having episodes of dysphagia and emesis, underwent multiple upper endoscopies with empiric balloon dilation to 20 mm as of 03/2015.  she presented on 04/06/2017 to [facility] with one-day history of several episodes of hematochezia, significant drop in hemoglobin and symptomatic anemia. She had NM bleeding scan which was unremarkable. She underwent EGD which was negative for acute blood loss and colonoscopy revealed residual blood in the left colon and severe diverticulosis. Terminal ileum was normal. She received blood transfusions and her hemoglobin responded appropriately.    Interval history: 05/18/2017  She had a follow-up hemoglobin as outpatient yesterday and it improved. She had low B12, normal folate and ferritin levels. She is receiving B12 injections, received 5 shots so far. She stopped ASA81 since discharge. She is taking oral iron and miralax to avoid constipation secondary to iron.   Follow-up visit 08/21/2017 She reports 2 month history of constipation, associated with significant straining, hard lumpy bowel movement. She stopped taking oral iron. She is taking MiraLAX as needed to have a BM. She acknowledges consuming  greasy foods, fried foods, red meat, potatoes, less fiber, sweet tea on a daily basis. She continues to take oral B12 daily.    NSAIDs: none  Antiplts/Anticoagulants/Anti thrombotics: None  GI Procedures:  Colonoscopy at [facility] in 05/2014, was reportedly normal  EGD findings: 04/07/2017 - Normal duodenal bulb and second portion of the duodenum.  - A Toupet fundoplication was found.  The wrap appears tight.  Dilated.  - Normal gastroesophageal junction and esophagus.   Colonoscopy findings: 04/07/2017 - The examined portion of the ileum was normal.  - Residual blood in the rectum, in the sigmoid colon, in the descending colon and at the splenic flexure.  - Diverticulosis in the sigmoid colon and in the descending colon, likely source of bleeding.  - Non-bleeding external hemorrhoids.     Past Medical History:  Diagnosis Date  . Allergy   . GERD (gastroesophageal reflux disease)   . Hypertension   . Thyroid disease     Past Surgical History:  Procedure Laterality Date  . CHOLECYSTECTOMY    . COLONOSCOPY  06/02/2014   cleared for 10 yrs- Dr Rayann Heman  . COLONOSCOPY N/A 04/07/2017   Procedure: COLONOSCOPY;  Surgeon: Lin Landsman, MD;  Location: [facility] ENDOSCOPY;  Service: Gastroenterology;  Laterality: N/A;  . ESOPHAGEAL DILATION    . ESOPHAGOGASTRODUODENOSCOPY N/A 04/07/2017   Procedure: ESOPHAGOGASTRODUODENOSCOPY (EGD);  Surgeon: Lin Landsman, MD;  Location: [facility] ENDOSCOPY;  Service: Gastroenterology;  Laterality: N/A;  . TONSILLECTOMY    . UPPER GI ENDOSCOPY  06/02/2014     Current Outpatient Medications:  .  Calcium-Magnesium-Vitamin D (CORAL CALCIUM) 185-50-100 MG-MG-UNIT CAPS, Take by  mouth., Disp: , Rfl:  .  cetirizine (ZYRTEC) 10 MG tablet, Take 10 mg by mouth daily. otc, Disp: , Rfl:  .  Cholecalciferol (VITAMIN D-3) 1000 units CAPS, Take 1 capsule by mouth daily., Disp: , Rfl:  .  fluticasone (FLONASE) 50 MCG/ACT nasal spray, Place 1 spray into both  nostrils daily., Disp: 16 g, Rfl: 11 .  levothyroxine (SYNTHROID, LEVOTHROID) 100 MCG tablet, TAKE 1 TABLET BY MOUTH ONCE DAILY, Disp: 30 tablet, Rfl: 0 .  loratadine (CLARITIN) 10 MG tablet, Take 10 mg by mouth., Disp: , Rfl:  .  lovastatin (MEVACOR) 20 MG tablet, TAKE 1 TABLET BY MOUTH ONCE DAILY, Disp: 30 tablet, Rfl: 1 .  montelukast (SINGULAIR) 10 MG tablet, Take 1 tablet (10 mg total) by mouth daily., Disp: 90 tablet, Rfl: 1 .  vitamin B-12 (CYANOCOBALAMIN) 1000 MCG tablet, Take 1,000 mcg by mouth daily., Disp: , Rfl:   Family History  Problem Relation Age of Onset  . Breast cancer Neg Hx      Social History   Tobacco Use  . Smoking status: Never Smoker  . Smokeless tobacco: Never Used  Substance Use Topics  . Alcohol use: No    Alcohol/week: 0.0 oz  . Drug use: No    Allergies as of 08/21/2017 - Review Complete 08/21/2017  Allergen Reaction Noted  . Codeine Nausea Only and Nausea And Vomiting 12/24/2013    Review of Systems:    All systems reviewed and negative except where noted in HPI.   Physical Exam:  BP 129/79   Pulse 64   Resp 16   Ht 5\' 3"  (1.6 m)   Wt 151 lb 3.2 oz (68.6 kg)   SpO2 98%   BMI 26.78 kg/m  No LMP recorded. Patient is postmenopausal.  General:   Alert,  Well-developed, well-nourished, pleasant and cooperative in NAD Head:  Normocephalic and atraumatic. Eyes:  Sclera clear, no icterus.   Conjunctiva pink. Ears:  Normal auditory acuity. Nose:  No deformity, discharge, or lesions. Mouth:  No deformity or lesions,oropharynx pink & moist. Neck:  Supple; no masses or thyromegaly. Lungs:  Respirations even and unlabored.  Clear throughout to auscultation.   No wheezes, crackles, or rhonchi. No acute distress. Heart:  Regular rate and rhythm; no murmurs, clicks, rubs, or gallops. Abdomen:  Normal bowel sounds. Soft, non-tender and non-distended without masses, hepatosplenomegaly or hernias noted.  No guarding or rebound tenderness.   Rectal: Not  performed Msk:  Symmetrical without gross deformities. Good, equal movement & strength bilaterally. Pulses:  Normal pulses noted. Extremities:  No clubbing or edema.  No cyanosis. Neurologic:  Alert and oriented x3;  grossly normal neurologically. Skin:  Intact without significant lesions or rashes. No jaundice. Lymph Nodes:  No significant cervical adenopathy. Psych:  Alert and cooperative. Normal mood and affect.  Imaging Studies: Reviewed  Assessment and Plan:   Pamela Hendrix is a 72 y.o. female with history of GERD, large hiatal hernia status post laparoscopic repair and Toupet fundoplication and PEG tube for gastropexy at [facility] by Dr. Margart Sickles on 08/28/2014. Admitted to [facility] for hematochezia secondary to diverticular bleed which resolved. She also had severe B12 deficiency, received B12 supplementation. Anemia resolved. Currently seen for two-month history of constipation. Her constipation is most likely dietary related. She also has history of hypothyroidism on thyroid replacement therapy  - Counseled her on high-fiber diet, fiber supplements and stool softener - Advised her to cut back on red meat, fried foods and sweet tea -  she can discontinue taking vitamin B12 supplements - Follow-up on hypothyroidism with primary care doctor - follow up with PCP, recommend checking CBC, B12, ferritin levels at annual physical which she said is coming up soon   Follow up in 1 month   Cephas Darby, MD

## 2017-08-21 NOTE — Patient Instructions (Signed)
High-Fiber Diet  Fiber, also called dietary fiber, is a type of carbohydrate found in fruits, vegetables, whole grains, and beans. A high-fiber diet can have many health benefits. Your health care provider may recommend a high-fiber diet to help:  · Prevent constipation. Fiber can make your bowel movements more regular.  · Lower your cholesterol.  · Relieve hemorrhoids, uncomplicated diverticulosis, or irritable bowel syndrome.  · Prevent overeating as part of a weight-loss plan.  · Prevent heart disease, type 2 diabetes, and certain cancers.    What is my plan?  The recommended daily intake of fiber includes:  · 38 grams for men under age 50.  · 30 grams for men over age 50.  · 25 grams for women under age 50.  · 21 grams for women over age 50.    You can get the recommended daily intake of dietary fiber by eating a variety of fruits, vegetables, grains, and beans. Your health care provider may also recommend a fiber supplement if it is not possible to get enough fiber through your diet.  What do I need to know about a high-fiber diet?  · Fiber supplements have not been widely studied for their effectiveness, so it is better to get fiber through food sources.  · Always check the fiber content on the nutrition facts label of any prepackaged food. Look for foods that contain at least 5 grams of fiber per serving.  · Ask your dietitian if you have questions about specific foods that are related to your condition, especially if those foods are not listed in the following section.  · Increase your daily fiber consumption gradually. Increasing your intake of dietary fiber too quickly may cause bloating, cramping, or gas.  · Drink plenty of water. Water helps you to digest fiber.  What foods can I eat?  Grains  Whole-grain breads. Multigrain cereal. Oats and oatmeal. Brown rice. Barley. Bulgur wheat. Millet. Bran muffins. Popcorn. Rye wafer crackers.  Vegetables   Sweet potatoes. Spinach. Kale. Artichokes. Cabbage. Broccoli. Green peas. Carrots. Squash.  Fruits  Berries. Pears. Apples. Oranges. Avocados. Prunes and raisins. Dried figs.  Meats and Other Protein Sources  Navy, kidney, pinto, and soy beans. Split peas. Lentils. Nuts and seeds.  Dairy  Fiber-fortified yogurt.  Beverages  Fiber-fortified soy milk. Fiber-fortified orange juice.  Other  Fiber bars.  The items listed above may not be a complete list of recommended foods or beverages. Contact your dietitian for more options.  What foods are not recommended?  Grains  White bread. Pasta made with refined flour. White rice.  Vegetables  Fried potatoes. Canned vegetables. Well-cooked vegetables.  Fruits  Fruit juice. Cooked, strained fruit.  Meats and Other Protein Sources  Fatty cuts of meat. Fried poultry or fried fish.  Dairy  Milk. Yogurt. Cream cheese. Sour cream.  Beverages  Soft drinks.  Other  Cakes and pastries. Butter and oils.  The items listed above may not be a complete list of foods and beverages to avoid. Contact your dietitian for more information.  What are some tips for including high-fiber foods in my diet?  · Eat a wide variety of high-fiber foods.  · Make sure that half of all grains consumed each day are whole grains.  · Replace breads and cereals made from refined flour or white flour with whole-grain breads and cereals.  · Replace white rice with brown rice, bulgur wheat, or millet.  · Start the day with a breakfast that is high in fiber,   such as a cereal that contains at least 5 grams of fiber per serving.  · Use beans in place of meat in soups, salads, or pasta.  · Eat high-fiber snacks, such as berries, raw vegetables, nuts, or popcorn.  This information is not intended to replace advice given to you by your health care provider. Make sure you discuss any questions you have with your health care provider.  Document Released: 05/02/2005 Document Revised: 10/08/2015 Document Reviewed: 10/15/2013   Elsevier Interactive Patient Education © 2018 Elsevier Inc.

## 2017-09-03 ENCOUNTER — Other Ambulatory Visit: Payer: Self-pay | Admitting: Family Medicine

## 2017-09-03 DIAGNOSIS — E782 Mixed hyperlipidemia: Secondary | ICD-10-CM

## 2017-09-04 ENCOUNTER — Ambulatory Visit (INDEPENDENT_AMBULATORY_CARE_PROVIDER_SITE_OTHER): Payer: Medicare HMO

## 2017-09-04 ENCOUNTER — Other Ambulatory Visit: Payer: Self-pay

## 2017-09-04 VITALS — BP 122/68 | HR 60 | Temp 98.4°F | Resp 12 | Ht 63.0 in | Wt 150.6 lb

## 2017-09-04 DIAGNOSIS — Z1231 Encounter for screening mammogram for malignant neoplasm of breast: Secondary | ICD-10-CM | POA: Diagnosis not present

## 2017-09-04 DIAGNOSIS — Z0000 Encounter for general adult medical examination without abnormal findings: Secondary | ICD-10-CM

## 2017-09-04 DIAGNOSIS — Z1239 Encounter for other screening for malignant neoplasm of breast: Secondary | ICD-10-CM

## 2017-09-04 NOTE — Progress Notes (Signed)
Subjective:   Pamela Hendrix is a 72 y.o. female who presents for an Initial Medicare Annual Wellness Visit.  Review of Systems    N/A  Cardiac Risk Factors include: advanced age (>62mn, >>82women);dyslipidemia;hypertension;sedentary lifestyle     Objective:    Today's Vitals   09/04/17 1329  BP: 122/68  Pulse: 60  Resp: 12  Temp: 98.4 F (36.9 C)  TempSrc: Oral  SpO2: 96%  Weight: 150 lb 9.6 oz (68.3 kg)  Height: '5\' 3"'$  (1.6 m)   Body mass index is 26.68 kg/m.  Advanced Directives 09/04/2017 04/06/2017 04/06/2017  Does Patient Have a Medical Advance Directive? Yes Yes Yes  Type of AParamed[place]of [place]Living will [place]Living will H[place]Living will  Does patient want to make changes to medical advance directive? - No - Patient declined -  Copy of [place] Chart? No - copy requested No - copy requested No - copy requested    Current Medications (verified) Outpatient Encounter Medications as of 09/04/2017  Medication Sig  . cetirizine (ZYRTEC) 10 MG tablet Take 10 mg by mouth daily. otc  . Cholecalciferol (VITAMIN D-3) 1000 units CAPS Take 1 capsule by mouth daily.  . fluticasone (FLONASE) 50 MCG/ACT nasal spray Place 1 spray into both nostrils daily.  .Marland Kitchenlevothyroxine (SYNTHROID, LEVOTHROID) 100 MCG tablet TAKE 1 TABLET BY MOUTH ONCE DAILY  . loratadine (CLARITIN) 10 MG tablet Take 10 mg by mouth.  . lovastatin (MEVACOR) 20 MG tablet TAKE 1 TABLET BY MOUTH ONCE DAILY  . montelukast (SINGULAIR) 10 MG tablet Take 1 tablet (10 mg total) by mouth daily.  . vitamin B-12 (CYANOCOBALAMIN) 1000 MCG tablet Take 1,000 mcg by mouth daily.  . Calcium-Magnesium-Vitamin D (CORAL CALCIUM) 185-50-100 MG-MG-UNIT CAPS Take by mouth.   No facility-administered encounter medications on file as of 09/04/2017.     Allergies (verified) Codeine   History: Past Medical History:    Diagnosis Date  . Allergy   . Diverticulosis   . GERD (gastroesophageal reflux disease)   . Hypertension   . Thyroid disease    Past Surgical History:  Procedure Laterality Date  . CHOLECYSTECTOMY    . COLONOSCOPY  06/02/2014   cleared for 10 yrs- Dr RRayann Heman . COLONOSCOPY N/A 04/07/2017   Procedure: COLONOSCOPY;  Surgeon: VLin Landsman MD;  [facility];  Service: Gastroenterology;  Laterality: N/A;  . ESOPHAGEAL DILATION    . ESOPHAGOGASTRODUODENOSCOPY N/A 04/07/2017   Procedure: ESOPHAGOGASTRODUODENOSCOPY (EGD);  Surgeon: VLin Landsman MD;  [facility];  Service: Gastroenterology;  Laterality: N/A;  . TONSILLECTOMY    . UPPER GI ENDOSCOPY  06/02/2014   Family History  Problem Relation Age of Onset  . Kidney disease Mother   . Cancer Mother        brain  . Heart disease Father   . Heart attack Father   . Lupus Sister   . Diabetes Sister   . Thyroid disease Sister   . Rheum arthritis Sister   . Diabetes Sister   . Multiple myeloma Sister   . Thyroid disease Sister   . Rheum arthritis Sister   . Breast cancer Neg Hx    Social History   Socioeconomic History  . Marital status: Married    Spouse name: PDoren Custard . Number of children: 1  . Years of education: some college  . Highest education level: 12th grade  Occupational History  . Occupation: Retired  Social Needs  . Financial resource strain: Not hard at all  . Food insecurity:    Worry: Never true    Inability: Never true  . Transportation needs:    Medical: No    Non-medical: No  Tobacco Use  . Smoking status: Never Smoker  . Smokeless tobacco: Never Used  . Tobacco comment: smoking cessation materials not required  Substance and Sexual Activity  . Alcohol use: No    Alcohol/week: 0.0 oz  . Drug use: No  . Sexual activity: Never  Lifestyle  . Physical activity:    Days per week: 0 days    Minutes per session: 0 min  . Stress: Not at all  Relationships  .  Social connections:    Talks on phone: Patient refused    Gets together: Patient refused    Attends religious service: Patient refused    Active member of club or organization: Patient refused    Attends meetings of clubs or organizations: Patient refused    Relationship status: Married  Other Topics Concern  . Not on file  Social History Narrative  . Not on file    Tobacco Counseling Counseling given: No Comment: smoking cessation materials not required   Clinical Intake:  Pre-visit preparation completed: Yes  Pain : No/denies pain   BMI - recorded: 26.68 Nutritional Status: BMI 25 -29 Overweight Nutritional Risks: None Diabetes: No  How often do you need to have someone help you when you read instructions, pamphlets, or other written materials from your doctor or pharmacy?: 1 - Never  Interpreter Needed?: No  Information entered by :: AEversole, LPN   Activities of Daily Living In your present state of health, do you have any difficulty performing the following activities: 09/04/2017 04/06/2017  Hearing? N N  Comment denies hearing aids, tinnitus -  Vision? N N  Comment wears eyeglasses, cataracts -  Difficulty concentrating or making decisions? Y N  Comment short term memory loss -  Walking or climbing stairs? N N  Dressing or bathing? N N  Doing errands, shopping? N N  Preparing Food and eating ? N -  Comment denies dentures -  Using the Toilet? N -  In the past six months, have you accidently leaked urine? Y -  Comment stress incontinence -  Do you have problems with loss of bowel control? N -  Managing your Medications? N -  Managing your Finances? N -  Housekeeping or managing your Housekeeping? N -  Some recent data might be hidden     Immunizations and Health Maintenance Immunization History  Administered Date(s) Administered  . H1N1 03/01/2017  . Influenza, High Dose Seasonal PF 03/01/2017  . Influenza,inj,Quad PF,6+ Mos 04/02/2015  .  Pneumococcal Conjugate-13 06/19/2015  . Pneumococcal Polysaccharide-23 03/05/2012  . Tdap 10/14/2010  . Zoster 02/25/2011   There are no preventive care reminders to display for this patient.  Patient Care Team: Juline Patch, MD as PCP - General (Family Medicine) Gabriel Carina Betsey Holiday, MD as Physician Assistant (Endocrinology) Corey Skains, MD as Consulting Physician (Cardiology) Lin Landsman, MD as Consulting Physician (Gastroenterology)  Indicate any recent Medical Services you may have received from other than [facility] providers in the past year (date may be approximate).     Assessment:   This is a routine wellness examination for [place].  Hearing/Vision screen Vision Screening Comments: Sees Dr. Jeni Salles for annual eye exams  Dietary issues and exercise activities discussed: Current Exercise Habits: The patient does not  participate in regular exercise at present, Exercise limited by: None identified  Goals    . DIET - INCREASE WATER INTAKE     Recommend to drink at least 6-8 8oz glasses of water per day.      Depression Screen PHQ 2/9 Scores 09/04/2017 09/07/2016 09/07/2015 06/19/2015  PHQ - 2 Score 0 2 0 0  PHQ- 9 Score 0 7 - -    Fall Risk Fall Risk  09/04/2017 09/07/2016 09/07/2015 06/19/2015  Falls in the past year? Yes No No No  Comment carport fell causing pt to fall - - -  Number falls in past yr: 1 - - -  Injury with Fall? No - - -  Risk for fall due to : History of fall(s);Impaired vision - - -  Risk for fall due to: Comment tripped over vaccum cleaner; wears eyeglasses - - -  Follow up Falls evaluation completed;Education provided;Falls prevention discussed - - -    Is the home free of loose throw rugs in walkways, pet beds, electrical cords, etc? Yes Adequate lighting to reduce risk of falls?  Yes In addition, does the patient have any of the following: Stairs in or around the home WITH handrails? Yes Grab bars in the bathroom? No  Shower chair or a place  to sit while bathing? Yes Use of an elevated toilet seat or a handicapped toilet? No Use of a cane, walker or w/c? No  Timed Get Up and Go Performed: Yes. Pt ambulated 10 feet within 7 sec. Gait stead-fast and without the use of an assistive device. No intervention required at this time. Fall risk prevention has been discussed.  Pt declined my offer to send Community Resource Referral to Care Guide for an elevated toilet seat.  Cognitive Function:     6CIT Screen 09/04/2017  What Year? 0 points  What month? 0 points  What time? 0 points  Count back from 20 0 points  Months in reverse 0 points  Repeat phrase 0 points  Total Score 0    Screening Tests Health Maintenance  Topic Date Due  . MAMMOGRAM  09/26/2017  . INFLUENZA VACCINE  12/14/2017  . TETANUS/TDAP  10/13/2020  . COLONOSCOPY  04/07/2022  . DEXA SCAN  Completed  . Hepatitis C Screening  Completed  . PNA vac Low Risk Adult  Completed    Qualifies for Shingles Vaccine? Yes. Zostavax completed 02/25/11. Due for Shingrix vaccine. Education has been provided regarding the importance of this vaccine. Pt has been advised to call her insurance company to determine her out of pocket expense. Advised she may also receive this vaccine at her local pharmacy or Health Dept. Verbalized acceptance and understanding.  Cancer Screenings: Lung: Low Dose CT Chest recommended if Age 15-80 years, 30 pack-year currently smoking OR have quit w/in 15years. Patient does not qualify. Breast: Up to date on Mammogram? Yes. Completed 10/04/16. Repeat in 6 months. Unable to locate report. Pt states she did not feel it was necessary to repeat at that time. Ordered mammogram today. Pt is aware she will receive a call from our office re: her appt. Message sent to referral coordinator for scheduling purposes. Up to date of Bone Density/Dexa? Yes. Completed 09/24/15. Osteoporotic screenings no longer required. Colorectal: Completed colonoscopy 04/07/17.  Repeat every 5 years.  Additional Screenings: Hepatitis C Screening: Completed 06/28/16   Plan:  I have personally reviewed and addressed the Medicare Annual Wellness questionnaire and have noted the following in the patient's chart:  A.  Medical and social history B. Use of alcohol, tobacco or illicit drugs  C. Current medications and supplements D. Functional ability and status E.  Nutritional status F.  Physical activity G. Advance directives H. List of other physicians I.  Hospitalizations, surgeries, and ER visits in previous 12 months J.  Arnold such as hearing and vision if needed, cognitive and depression L. Referrals and appointments  In addition, I have reviewed and discussed with patient certain preventive protocols, quality metrics, and best practice recommendations. A written personalized care plan for preventive services as well as general preventive health recommendations were provided to patient.  Signed,  Aleatha Borer, LPN Nurse Health Advisor  MD Recommendations: Zostavax completed 02/25/11. Due for Shingrix vaccine. Education has been provided regarding the importance of this vaccine. Pt has been advised to call her insurance company to determine her out of pocket expense. Advised she may also receive this vaccine at her local pharmacy or Health Dept. Verbalized acceptance and understanding.  Due for mammogram. Completed 10/04/16. Repeat in 6 months. Unable to locate report. Pt states she did not feel it was necessary to repeat at that time. Ordered mammogram today. Pt is aware she will receive a call from our office re: her appt. Message sent to referral coordinator for scheduling purposes.

## 2017-09-04 NOTE — Patient Instructions (Addendum)
Pamela Hendrix , Thank you for taking time to come for your Medicare Wellness Visit. I appreciate your ongoing commitment to your health goals. Please review the following plan we discussed and let me know if I can assist you in the future.   Screening recommendations/referrals: Colorectal Screening: Completed colonoscopy 04/07/17. Repeat every 5 years. Mammogram: Completed 10/04/16. Repeat in 6 months. Ordered today. You will receive a call from our office regarding your appointment. Bone Density: Completed 09/24/15. Osteoporotic screenings no longer required.  Vision and Dental Exams: Recommended annual ophthalmology exams for early detection of glaucoma and other disorders of the eye Recommended annual dental exams for proper oral hygiene  Vaccinations: Influenza vaccine: Up to date Pneumococcal vaccine: Completed series Tdap vaccine: Up to date Shingles vaccine: Please call your insurance company to determine your out of pocket expense for the Shingrix vaccine. You may also receive this vaccine at your local pharmacy or Health Dept.   Advanced directives: Please bring a copy of your POA (Power of Attorney) and/or Living Will to your next appointment.  Conditions/risks identified: Recommend to drink at least 6-8 8oz glasses of water per day.  Next appointment: Please schedule your Annual Wellness Visit with your Nurse Health Advisor in one year.  Preventive Care 63 Years and Older, Female Preventive care refers to lifestyle choices and visits with your health care provider that can promote health and wellness. What does preventive care include?  A yearly physical exam. This is also called an annual well check.  Dental exams once or twice a year.  Routine eye exams. Ask your health care provider how often you should have your eyes checked.  Personal lifestyle choices, including:  Daily care of your teeth and gums.  Regular physical activity.  Eating a healthy diet.  Avoiding  tobacco and drug use.  Limiting alcohol use.  Practicing safe sex.  Taking low-dose aspirin every day.  Taking vitamin and mineral supplements as recommended by your health care provider. What happens during an annual well check? The services and screenings done by your health care provider during your annual well check will depend on your age, overall health, lifestyle risk factors, and family history of disease. Counseling  Your health care provider may ask you questions about your:  Alcohol use.  Tobacco use.  Drug use.  Emotional well-being.  Home and relationship well-being.  Sexual activity.  Eating habits.  History of falls.  Memory and ability to understand (cognition).  Work and work Statistician.  Reproductive health. Screening  You may have the following tests or measurements:  Height, weight, and BMI.  Blood pressure.  Lipid and cholesterol levels. These may be checked every 5 years, or more frequently if you are over 47 years old.  Skin check.  Lung cancer screening. You may have this screening every year starting at age 12 if you have a 30-pack-year history of smoking and currently smoke or have quit within the past 15 years.  Fecal occult blood test (FOBT) of the stool. You may have this test every year starting at age 61.  Flexible sigmoidoscopy or colonoscopy. You may have a sigmoidoscopy every 5 years or a colonoscopy every 10 years starting at age 55.  Hepatitis C blood test.  Hepatitis B blood test.  Sexually transmitted disease (STD) testing.  Diabetes screening. This is done by checking your blood sugar (glucose) after you have not eaten for a while (fasting). You may have this done every 1-3 years.  Bone density scan. This  is done to screen for osteoporosis. You may have this done starting at age 34.  Mammogram. This may be done every 1-2 years. Talk to your health care provider about how often you should have regular  mammograms. Talk with your health care provider about your test results, treatment options, and if necessary, the need for more tests. Vaccines  Your health care provider may recommend certain vaccines, such as:  Influenza vaccine. This is recommended every year.  Tetanus, diphtheria, and acellular pertussis (Tdap, Td) vaccine. You may need a Td booster every 10 years.  Zoster vaccine. You may need this after age 36.  Pneumococcal 13-valent conjugate (PCV13) vaccine. One dose is recommended after age 66.  Pneumococcal polysaccharide (PPSV23) vaccine. One dose is recommended after age 46. Talk to your health care provider about which screenings and vaccines you need and how often you need them. This information is not intended to replace advice given to you by your health care provider. Make sure you discuss any questions you have with your health care provider. Document Released: 05/29/2015 Document Revised: 01/20/2016 Document Reviewed: 03/03/2015 Elsevier Interactive Patient Education  2017 [place] Prevention in the Home Falls can cause injuries. They can happen to people of all ages. There are many things you can do to make your home safe and to help prevent falls. What can I do on the outside of my home?  Regularly fix the edges of walkways and driveways and fix any cracks.  Remove anything that might make you trip as you walk through a door, such as a raised step or threshold.  Trim any bushes or trees on the path to your home.  Use bright outdoor lighting.  Clear any walking paths of anything that might make someone trip, such as rocks or tools.  Regularly check to see if handrails are loose or broken. Make sure that both sides of any steps have handrails.  Any raised decks and porches should have guardrails on the edges.  Have any leaves, snow, or ice cleared regularly.  Use sand or salt on walking paths during winter.  Clean up any spills in your garage  right away. This includes oil or grease spills. What can I do in the bathroom?  Use night lights.  Install grab bars by the toilet and in the tub and shower. Do not use towel bars as grab bars.  Use non-skid mats or decals in the tub or shower.  If you need to sit down in the shower, use a plastic, non-slip stool.  Keep the floor dry. Clean up any water that spills on the floor as soon as it happens.  Remove soap buildup in the tub or shower regularly.  Attach bath mats securely with double-sided non-slip rug tape.  Do not have throw rugs and other things on the floor that can make you trip. What can I do in the bedroom?  Use night lights.  Make sure that you have a light by your bed that is easy to reach.  Do not use any sheets or blankets that are too big for your bed. They should not hang down onto the floor.  Have a firm chair that has side arms. You can use this for support while you get dressed.  Do not have throw rugs and other things on the floor that can make you trip. What can I do in the kitchen?  Clean up any spills right away.  Avoid walking on wet floors.  Keep items that you use a lot in easy-to-reach places.  If you need to reach something above you, use a strong step stool that has a grab bar.  Keep electrical cords out of the way.  Do not use floor polish or wax that makes floors slippery. If you must use wax, use non-skid floor wax.  Do not have throw rugs and other things on the floor that can make you trip. What can I do with my stairs?  Do not leave any items on the stairs.  Make sure that there are handrails on both sides of the stairs and use them. Fix handrails that are broken or loose. Make sure that handrails are as long as the stairways.  Check any carpeting to make sure that it is firmly attached to the stairs. Fix any carpet that is loose or worn.  Avoid having throw rugs at the top or bottom of the stairs. If you do have throw rugs,  attach them to the floor with carpet tape.  Make sure that you have a light switch at the top of the stairs and the bottom of the stairs. If you do not have them, ask someone to add them for you. What else can I do to help prevent falls?  Wear shoes that:  Do not have high heels.  Have rubber bottoms.  Are comfortable and fit you well.  Are closed at the toe. Do not wear sandals.  If you use a stepladder:  Make sure that it is fully opened. Do not climb a closed stepladder.  Make sure that both sides of the stepladder are locked into place.  Ask someone to hold it for you, if possible.  Clearly mark and make sure that you can see:  Any grab bars or handrails.  First and last steps.  Where the edge of each step is.  Use tools that help you move around (mobility aids) if they are needed. These include:  Canes.  Walkers.  Scooters.  Crutches.  Turn on the lights when you go into a dark area. Replace any light bulbs as soon as they burn out.  Set up your furniture so you have a clear path. Avoid moving your furniture around.  If any of your floors are uneven, fix them.  If there are any pets around you, be aware of where they are.  Review your medicines with your doctor. Some medicines can make you feel dizzy. This can increase your chance of falling. Ask your doctor what other things that you can do to help prevent falls. This information is not intended to replace advice given to you by your health care provider. Make sure you discuss any questions you have with your health care provider. Document Released: 02/26/2009 Document Revised: 10/08/2015 Document Reviewed: 06/06/2014 Elsevier Interactive Patient Education  2017 [employer].

## 2017-09-06 ENCOUNTER — Other Ambulatory Visit: Payer: Self-pay

## 2017-09-06 DIAGNOSIS — Z1239 Encounter for other screening for malignant neoplasm of breast: Secondary | ICD-10-CM

## 2017-09-06 DIAGNOSIS — Z0000 Encounter for general adult medical examination without abnormal findings: Secondary | ICD-10-CM

## 2017-09-06 NOTE — Progress Notes (Signed)
Received message from referral coordinator stating radiology requested original mammogram order be changed to the following codes: [id number], [id number] and [id number]. Order changed as requested.

## 2017-09-11 ENCOUNTER — Encounter: Payer: Self-pay | Admitting: Family Medicine

## 2017-09-12 ENCOUNTER — Ambulatory Visit (INDEPENDENT_AMBULATORY_CARE_PROVIDER_SITE_OTHER): Payer: Medicare HMO | Admitting: Family Medicine

## 2017-09-12 ENCOUNTER — Encounter: Payer: Self-pay | Admitting: Family Medicine

## 2017-09-12 VITALS — BP 120/62 | HR 60 | Ht 63.0 in | Wt 142.0 lb

## 2017-09-12 DIAGNOSIS — D500 Iron deficiency anemia secondary to blood loss (chronic): Secondary | ICD-10-CM | POA: Diagnosis not present

## 2017-09-12 DIAGNOSIS — K5790 Diverticulosis of intestine, part unspecified, without perforation or abscess without bleeding: Secondary | ICD-10-CM | POA: Diagnosis not present

## 2017-09-12 DIAGNOSIS — Z8719 Personal history of other diseases of the digestive system: Secondary | ICD-10-CM | POA: Diagnosis not present

## 2017-09-12 DIAGNOSIS — E782 Mixed hyperlipidemia: Secondary | ICD-10-CM

## 2017-09-12 DIAGNOSIS — E039 Hypothyroidism, unspecified: Secondary | ICD-10-CM | POA: Diagnosis not present

## 2017-09-12 DIAGNOSIS — Z0000 Encounter for general adult medical examination without abnormal findings: Secondary | ICD-10-CM

## 2017-09-12 LAB — HEMOCCULT GUIAC POC 1CARD (OFFICE): Fecal Occult Blood, POC: NEGATIVE

## 2017-09-12 MED ORDER — LOVASTATIN 20 MG PO TABS: 20.0000 mg | ORAL_TABLET | Freq: Every day | ORAL | 5 refills | Status: DC

## 2017-09-12 MED ORDER — LEVOTHYROXINE SODIUM 100 MCG PO TABS: 100.0000 ug | ORAL_TABLET | Freq: Every day | ORAL | 5 refills | Status: DC

## 2017-09-12 NOTE — Progress Notes (Signed)
Name: Pamela Hendrix   MRN: [id number]    DOB: February 28, 1946   Date:09/12/2017       Progress Note  Subjective  Chief Complaint  Chief Complaint  Patient presents with  . Annual Exam    has mammo sched next month and is up to date on everything else  . Hypothyroidism  . Hyperlipidemia  . Anemia    Patient presents for annual physical.   Hyperlipidemia  This is a chronic problem. The current episode started more than 1 year ago. The problem is controlled. Recent lipid tests were reviewed and are normal. She has no history of chronic renal disease, diabetes, hypothyroidism, liver disease, obesity or nephrotic syndrome. Factors aggravating her hyperlipidemia include fatty foods. Pertinent negatives include no chest pain, focal sensory loss, focal weakness, leg pain, myalgias or shortness of breath. Current antihyperlipidemic treatment includes statins. The current treatment provides moderate improvement of lipids. There are no compliance problems.   Anemia  Presents for follow-up visit. There has been no abdominal pain, anorexia, bruising/bleeding easily, confusion, fever, leg swelling, light-headedness, malaise/fatigue, pallor, palpitations, paresthesias, pica or weight loss. Signs of blood loss that are not present include hematemesis, hematochezia, melena, menorrhagia and vaginal bleeding. There is no history of chronic renal disease or hypothyroidism. There are no compliance problems (iron discontinued).     No problem-specific Assessment & Plan notes found for this encounter.   Past Medical History:  Diagnosis Date  . Allergy   . Diverticulosis   . GERD (gastroesophageal reflux disease)   . Hypertension   . Thyroid disease     Past Surgical History:  Procedure Laterality Date  . CHOLECYSTECTOMY    . COLONOSCOPY  06/02/2014   cleared for 10 yrs- Dr Rayann Heman  . COLONOSCOPY N/A 04/07/2017   Procedure: COLONOSCOPY;  Surgeon: Lin Landsman, MD;  Location[facility] ENDOSCOPY;   Service: Gastroenterology;  Laterality: N/A;  . ESOPHAGEAL DILATION    . ESOPHAGOGASTRODUODENOSCOPY N/A 04/07/2017   Procedure: ESOPHAGOGASTRODUODENOSCOPY (EGD);  Surgeon: Lin Landsman, MD;  Location: [facility] ENDOSCOPY;  Service: Gastroenterology;  Laterality: N/A;  . TONSILLECTOMY    . UPPER GI ENDOSCOPY  06/02/2014    Family History  Problem Relation Age of Onset  . Kidney disease Mother   . Cancer Mother        brain  . Heart disease Father   . Heart attack Father   . Lupus Sister   . Diabetes Sister   . Thyroid disease Sister   . Rheum arthritis Sister   . Diabetes Sister   . Multiple myeloma Sister   . Thyroid disease Sister   . Rheum arthritis Sister   . Breast cancer Neg Hx     Social History   Socioeconomic History  . Marital status: Married    Spouse name: Doren Custard  . Number of children: 1  . Years of education: some college  . Highest education level: 12th grade  Occupational History  . Occupation: Retired  Scientific laboratory technician  . Financial resource strain: Not hard at all  . Food insecurity:    Worry: Never true    Inability: Never true  . Transportation needs:    Medical: No    Non-medical: No  Tobacco Use  . Smoking status: Never Smoker  . Smokeless tobacco: Never Used  . Tobacco comment: smoking cessation materials not required  Substance and Sexual Activity  . Alcohol use: No    Alcohol/week: 0.0 oz  . Drug use: No  . Sexual activity:  Never  Lifestyle  . Physical activity:    Days per week: 0 days    Minutes per session: 0 min  . Stress: Not at all  Relationships  . Social connections:    Talks on phone: Patient refused    Gets together: Patient refused    Attends religious service: Patient refused    Active member of club or organization: Patient refused    Attends meetings of clubs or organizations: Patient refused    Relationship status: Married  . Intimate partner violence:    Fear of current or ex partner: No    Emotionally abused:  No    Physically abused: No    Forced sexual activity: No  Other Topics Concern  . Not on file  Social History Narrative  . Not on file    Allergies  Allergen Reactions  . Codeine Nausea Only and Nausea And Vomiting    Outpatient Medications Prior to Visit  Medication Sig Dispense Refill  . cetirizine (ZYRTEC) 10 MG tablet Take 10 mg by mouth daily. otc    . Cholecalciferol (VITAMIN D-3) 1000 units CAPS Take 1 capsule by mouth daily.    . fluticasone (FLONASE) 50 MCG/ACT nasal spray Place 1 spray into both nostrils daily. 16 g 11  . montelukast (SINGULAIR) 10 MG tablet Take 1 tablet (10 mg total) by mouth daily. 90 tablet 1  . vitamin B-12 (CYANOCOBALAMIN) 1000 MCG tablet Take 1,000 mcg by mouth daily.    Marland Kitchen levothyroxine (SYNTHROID, LEVOTHROID) 100 MCG tablet TAKE 1 TABLET BY MOUTH ONCE DAILY 30 tablet 0  . lovastatin (MEVACOR) 20 MG tablet TAKE 1 TABLET BY MOUTH ONCE DAILY 30 tablet 0  . Calcium-Magnesium-Vitamin D (CORAL CALCIUM) 185-50-100 MG-MG-UNIT CAPS Take by mouth.    . loratadine (CLARITIN) 10 MG tablet Take 10 mg by mouth.     No facility-administered medications prior to visit.     Review of Systems  Constitutional: Positive for diaphoresis. Negative for chills, fever, malaise/fatigue and weight loss.  HENT: Negative for ear discharge, ear pain and sore throat.   Eyes: Positive for blurred vision.  Respiratory: Negative for cough, sputum production, shortness of breath and wheezing.   Cardiovascular: Negative for chest pain, palpitations and leg swelling.  Gastrointestinal: Negative for abdominal pain, anorexia, blood in stool, constipation, diarrhea, heartburn, hematemesis, hematochezia, melena and nausea.  Genitourinary: Negative for dysuria, frequency, hematuria, menorrhagia, urgency and vaginal bleeding.  Musculoskeletal: Negative for back pain, joint pain, myalgias and neck pain.  Skin: Negative for pallor and rash.  Neurological: Negative for dizziness,  tingling, sensory change, focal weakness, light-headedness, headaches and paresthesias.       Auras/migraine without headache  Endo/Heme/Allergies: Negative for environmental allergies and polydipsia. Does not bruise/bleed easily.  Psychiatric/Behavioral: Negative for confusion, depression and suicidal ideas. The patient is not nervous/anxious and does not have insomnia.      Objective  Vitals:   09/12/17 0824  BP: 120/62  Pulse: 60  Weight: 142 lb (64.4 kg)  Height: '5\' 3"'$  (1.6 m)    Physical Exam  Constitutional: She is oriented to person, place, and time. Vital signs are normal. She appears well-developed and well-nourished.  HENT:  Head: Normocephalic and atraumatic.  Right Ear: Hearing, tympanic membrane, external ear and ear canal normal.  Left Ear: Hearing, tympanic membrane, external ear and ear canal normal.  Nose: Nose normal.  Mouth/Throat: Uvula is midline, oropharynx is clear and moist and mucous membranes are normal. Mucous membranes are not pale. No oropharyngeal  exudate, posterior oropharyngeal edema or posterior oropharyngeal erythema.  Eyes: Pupils are equal, round, and reactive to light. Conjunctivae and EOM are normal. Lids are everted and swept, no foreign bodies found. Left eye exhibits no hordeolum. No foreign body present in the left eye. Right conjunctiva is not injected. Left conjunctiva is not injected. No scleral icterus. Right pupil is round and reactive. Left pupil is round and reactive. Pupils are equal.  Neck: Normal range of motion. Neck supple. Normal carotid pulses, no hepatojugular reflux and no JVD present. No tracheal tenderness present. Carotid bruit is not present. No tracheal deviation present. No thyroid mass and no thyromegaly present.  Cardiovascular: Normal rate, regular rhythm, S1 normal, S2 normal, normal heart sounds, intact distal pulses and normal pulses. PMI is not displaced. Exam reveals no gallop, no S3, no S4 and no friction rub.  No  murmur heard. Pulses:      Carotid pulses are 2+ on the right side, and 2+ on the left side.      Radial pulses are 2+ on the right side, and 2+ on the left side.       Femoral pulses are 2+ on the right side, and 2+ on the left side.      Popliteal pulses are 2+ on the right side, and 2+ on the left side.       Dorsalis pedis pulses are 2+ on the right side, and 2+ on the left side.       Posterior tibial pulses are 2+ on the right side, and 2+ on the left side.  Pulmonary/Chest: Effort normal and breath sounds normal. No stridor. No respiratory distress. She has no decreased breath sounds. She has no wheezes. She has no rhonchi. She has no rales.  Abdominal: Soft. Normal appearance and bowel sounds are normal. She exhibits no abdominal bruit and no mass. There is no hepatosplenomegaly. There is no tenderness. There is no rebound and no guarding.  Genitourinary: Rectum normal. Rectal exam shows guaiac negative stool.  Musculoskeletal: Normal range of motion. She exhibits no edema or tenderness.  Lymphadenopathy:       Head (right side): No submandibular adenopathy present.       Head (left side): No submandibular adenopathy present.    She has no cervical adenopathy.  Neurological: She is alert and oriented to person, place, and time. She has normal strength. She displays normal reflexes. No cranial nerve deficit or sensory deficit.  Reflex Scores:      Tricep reflexes are 2+ on the right side and 2+ on the left side.      Bicep reflexes are 2+ on the right side and 2+ on the left side.      Brachioradialis reflexes are 2+ on the right side and 2+ on the left side.      Patellar reflexes are 2+ on the right side and 2+ on the left side.      Achilles reflexes are 2+ on the right side and 2+ on the left side. Skin: Skin is warm, dry and intact. No rash noted.  Psychiatric: She has a normal mood and affect. Her mood appears not anxious. She does not exhibit a depressed mood.  Nursing note and  vitals reviewed.     Assessment & Plan  Problem List Items Addressed This Visit      Endocrine   Hypothyroid   Relevant Medications   levothyroxine (SYNTHROID, LEVOTHROID) 100 MCG tablet   Other Relevant Orders   TSH  Other   Mixed hyperlipidemia   Relevant Medications   lovastatin (MEVACOR) 20 MG tablet   Other Relevant Orders   Lipid Panel With LDL/HDL Ratio    Other Visit Diagnoses    Annual physical exam    -  Primary   Iron deficiency anemia due to chronic blood loss       Diverticulosis       History of GI bleed       Relevant Orders   POCT occult blood stool (Completed)   CBC      Meds ordered this encounter  Medications  . levothyroxine (SYNTHROID, LEVOTHROID) 100 MCG tablet    Sig: Take 1 tablet (100 mcg total) by mouth daily.    Dispense:  30 tablet    Refill:  5    Please consider 90 day supplies to promote better adherence  . lovastatin (MEVACOR) 20 MG tablet    Sig: Take 1 tablet (20 mg total) by mouth daily.    Dispense:  30 tablet    Refill:  5    Please consider 90 day supplies to promote better adherence      Dr. Otilio Miu [facility]  09/12/17

## 2017-09-13 LAB — CBC
Hematocrit: 35.2 % (ref 34.0–46.6)
Hemoglobin: 11.4 g/dL (ref 11.1–15.9)
MCH: 29.7 pg (ref 26.6–33.0)
MCHC: 32.4 g/dL (ref 31.5–35.7)
MCV: 92 fL (ref 79–97)
Platelets: 191 10*3/uL (ref 150–379)
RBC: 3.84 x10E6/uL (ref 3.77–5.28)
RDW: 15.2 % (ref 12.3–15.4)
WBC: 5.7 10*3/uL (ref 3.4–10.8)

## 2017-09-13 LAB — LIPID PANEL WITH LDL/HDL RATIO
Cholesterol, Total: 134 mg/dL (ref 100–199)
HDL: 62 mg/dL (ref >39)
LDL Calculated: 59 mg/dL (ref 0–99)
LDl/HDL Ratio: 1 ratio (ref 0.0–3.2)
Triglycerides: 67 mg/dL (ref 0–149)
VLDL Cholesterol Cal: 13 mg/dL (ref 5–40)

## 2017-09-13 LAB — TSH: TSH: 0.084 u[IU]/mL — ABNORMAL LOW (ref 0.450–4.500)

## 2017-09-14 ENCOUNTER — Other Ambulatory Visit: Payer: Self-pay

## 2017-09-14 DIAGNOSIS — E039 Hypothyroidism, unspecified: Secondary | ICD-10-CM

## 2017-09-14 MED ORDER — LEVOTHYROXINE SODIUM 88 MCG PO TABS: 100.0000 ug | ORAL_TABLET | Freq: Every day | ORAL | 1 refills | Status: DC

## 2017-09-20 ENCOUNTER — Ambulatory Visit: Payer: Medicare HMO | Admitting: Gastroenterology

## 2017-09-20 ENCOUNTER — Encounter: Payer: Self-pay | Admitting: Gastroenterology

## 2017-09-20 VITALS — BP 137/74 | HR 62 | Ht 63.0 in | Wt 147.2 lb

## 2017-09-20 DIAGNOSIS — K5904 Chronic idiopathic constipation: Secondary | ICD-10-CM

## 2017-09-20 NOTE — Progress Notes (Signed)
Cephas Darby, MD [address]  [place]  [place], [place] [address]  Main: [phone]  Fax: [phone]    Gastroenterology Consultation  Referring Provider:     Juline Patch, MD Primary Care Physician:  Juline Patch, MD Primary Gastroenterologist:  Dr. Cephas Darby Reason for Consultation:    constipation        HPI:   Pamela Hendrix is a 72 y.o. female referred by Dr. Juline Patch, MD  for consultation & management of hematochezia. She is here for hospital follow-up. She has history of GERD, large hiatal hernia status post laparoscopic repair and Toupet fundoplication and PEG tube for gastropexy at [facility] by Dr. Margart Sickles on 08/28/2014.  The PEG tube was removed 6 weeks after surgery.  Apparently, she was having episodes of dysphagia and emesis, underwent multiple upper endoscopies with empiric balloon dilation to 20 mm as of 03/2015.  she presented on 04/06/2017 to [facility] with one-day history of several episodes of hematochezia, significant drop in hemoglobin and symptomatic anemia. She had NM bleeding scan which was unremarkable. She underwent EGD which was negative for acute blood loss and colonoscopy revealed residual blood in the left colon and severe diverticulosis. Terminal ileum was normal. She received blood transfusions and her hemoglobin responded appropriately.    Interval history: 05/18/2017  She had a follow-up hemoglobin as outpatient yesterday and it improved. She had low B12, normal folate and ferritin levels. She is receiving B12 injections, received 5 shots so far. She stopped ASA81 since discharge. She is taking oral iron and miralax to avoid constipation secondary to iron.   Follow-up visit 08/21/2017 She reports 2 month history of constipation, associated with significant straining, hard lumpy bowel movement. She stopped taking oral iron. She is taking MiraLAX as needed to have a BM. She acknowledges consuming  greasy foods, fried foods, red meat, potatoes, less fiber, sweet tea on a daily basis. She continues to take oral B12 daily.   Follow-up visit 09/20/2017 She notices mild improvement in constipation. Continues to have irregular bowel movements. Her bowel pattern is 2-3 days without bowel movement followed by increased bowel frequency for 1-2 days. She reports having lower abdominal pain and bloating worse with constipation. She is trying to cut back on greasy foods and carbonated beverages but she says it is difficult for her to eliminate them. She is taking MiraLAX as needed only. Her TSH levels came back low, therefore she is on lower dose of levothyroxine   NSAIDs: none  Antiplts/Anticoagulants/Anti thrombotics: None  GI Procedures:  Colonoscopy at [facility] in 05/2014, was reportedly normal  EGD findings: 04/07/2017 - Normal duodenal bulb and second portion of the duodenum.  - A Toupet fundoplication was found.  The wrap appears tight.  Dilated.  - Normal gastroesophageal junction and esophagus.   Colonoscopy findings: 04/07/2017 - The examined portion of the ileum was normal.  - Residual blood in the rectum, in the sigmoid colon, in the descending colon and at the splenic flexure.  - Diverticulosis in the sigmoid colon and in the descending colon, likely source of bleeding.  - Non-bleeding external hemorrhoids.     Past Medical History:  Diagnosis Date  . Allergy   . Diverticulosis   . GERD (gastroesophageal reflux disease)   . Hypertension   . Thyroid disease     Past Surgical History:  Procedure Laterality Date  . CHOLECYSTECTOMY    . COLONOSCOPY  06/02/2014   cleared for  10 yrs- Dr Rayann Heman  . COLONOSCOPY N/A 04/07/2017   Procedure: COLONOSCOPY;  Surgeon: Lin Landsman, MD;  Location: [facility] ENDOSCOPY;  Service: Gastroenterology;  Laterality: N/A;  . ESOPHAGEAL DILATION    . ESOPHAGOGASTRODUODENOSCOPY N/A 04/07/2017   Procedure: ESOPHAGOGASTRODUODENOSCOPY (EGD);   Surgeon: Lin Landsman, MD;  Location: [facility] ENDOSCOPY;  Service: Gastroenterology;  Laterality: N/A;  . TONSILLECTOMY    . UPPER GI ENDOSCOPY  06/02/2014     Current Outpatient Medications:  .  cetirizine (ZYRTEC) 10 MG tablet, Take 10 mg by mouth daily. otc, Disp: , Rfl:  .  Cholecalciferol (VITAMIN D-3) 1000 units CAPS, Take 1 capsule by mouth daily., Disp: , Rfl:  .  fluticasone (FLONASE) 50 MCG/ACT nasal spray, Place 1 spray into both nostrils daily., Disp: 16 g, Rfl: 11 .  levothyroxine (SYNTHROID, LEVOTHROID) 88 MCG tablet, Take 1 tablet (88 mcg total) by mouth daily., Disp: 30 tablet, Rfl: 1 .  lovastatin (MEVACOR) 20 MG tablet, Take 1 tablet (20 mg total) by mouth daily., Disp: 30 tablet, Rfl: 5 .  montelukast (SINGULAIR) 10 MG tablet, Take 1 tablet (10 mg total) by mouth daily., Disp: 90 tablet, Rfl: 1 .  vitamin B-12 (CYANOCOBALAMIN) 1000 MCG tablet, Take 1,000 mcg by mouth daily., Disp: , Rfl:  .  Calcium-Magnesium-Vitamin D (CORAL CALCIUM) 185-50-100 MG-MG-UNIT CAPS, Take by mouth., Disp: , Rfl:   Family History  Problem Relation Age of Onset  . Kidney disease Mother   . Cancer Mother        brain  . Heart disease Father   . Heart attack Father   . Lupus Sister   . Diabetes Sister   . Thyroid disease Sister   . Rheum arthritis Sister   . Diabetes Sister   . Multiple myeloma Sister   . Thyroid disease Sister   . Rheum arthritis Sister   . Breast cancer Neg Hx      Social History   Tobacco Use  . Smoking status: Never Smoker  . Smokeless tobacco: Never Used  . Tobacco comment: smoking cessation materials not required  Substance Use Topics  . Alcohol use: No    Alcohol/week: 0.0 oz  . Drug use: No    Allergies as of 09/20/2017 - Review Complete 09/20/2017  Allergen Reaction Noted  . Codeine Nausea Only and Nausea And Vomiting 12/24/2013    Review of Systems:    All systems reviewed and negative except where noted in HPI.   Physical Exam:  BP  137/74   Pulse 62   Ht '5\' 3"'$  (1.6 m)   Wt 147 lb 3.2 oz (66.8 kg)   BMI 26.08 kg/m  No LMP recorded. Patient is postmenopausal.  General:   Alert,  Well-developed, well-nourished, pleasant and cooperative in NAD Head:  Normocephalic and atraumatic. Eyes:  Sclera clear, no icterus.   Conjunctiva pink. Ears:  Normal auditory acuity. Nose:  No deformity, discharge, or lesions. Mouth:  No deformity or lesions,oropharynx pink & moist. Neck:  Supple; no masses or thyromegaly. Lungs:  Respirations even and unlabored.  Clear throughout to auscultation.   No wheezes, crackles, or rhonchi. No acute distress. Heart:  Regular rate and rhythm; no murmurs, clicks, rubs, or gallops. Abdomen:  Normal bowel sounds. Soft, non-tender and non-distended without masses, hepatosplenomegaly or hernias noted.  No guarding or rebound tenderness.   Rectal: Not performed Msk:  Symmetrical without gross deformities. Good, equal movement & strength bilaterally. Pulses:  Normal pulses noted. Extremities:  No clubbing or edema.  No cyanosis. Neurologic:  Alert and oriented x3;  grossly normal neurologically. Skin:  Intact without significant lesions or rashes. No jaundice. Lymph Nodes:  No significant cervical adenopathy. Psych:  Alert and cooperative. Normal mood and affect.  Imaging Studies: Reviewed  Assessment and Plan:   Pamela Hendrix is a 72 y.o. female with history of GERD, large hiatal hernia status post laparoscopic repair and Toupet fundoplication and PEG tube for gastropexy at [facility] by Dr. Margart Sickles on 08/28/2014. Admitted to [facility] for hematochezia secondary to diverticular bleed which resolved. She also had severe B12 deficiency, received B12 supplementation. Anemia resolved. Currently seen for two-month history of constipation. Her constipation is most likely dietary related. She also has history of hypothyroidism on thyroid replacement therapy  - continue to stay on high-fiber diet, fiber  supplements - Advised her to cut back on red meat, fried foods and sweet tea - discussed with her about different medical treatment options for her chronic idiopathic constipation including linaclotide, plecanatide, Amitiza and motegrity - I will try Amitiza 24 mg twice daily, samples provided   Follow up in 1 month   Cephas Darby, MD

## 2017-09-28 ENCOUNTER — Telehealth: Payer: Self-pay

## 2017-09-28 NOTE — Telephone Encounter (Signed)
Patient stated that she did not know if the Amitiza was working for her.  She stated that she was taking 24mg   Once a day, but was willing for me to send rx to pharmacy.  I reviewed office note.  Dr. Marius Ditch advised 24mg  twice a day.  I asked patient to stop by the office to get another week of samples and take it twice a day.  Continue taking logs and call me back with an update.  Thanks [employer]

## 2017-09-29 ENCOUNTER — Ambulatory Visit
Admission: RE | Admit: 2017-09-29 | Discharge: 2017-09-29 | Disposition: A | Discharge status: Home / Self Care | Payer: Medicare HMO | Source: Ambulatory Visit | Attending: Family Medicine | Admitting: Family Medicine

## 2017-09-29 DIAGNOSIS — Z1239 Encounter for other screening for malignant neoplasm of breast: Secondary | ICD-10-CM

## 2017-09-29 DIAGNOSIS — Z1231 Encounter for screening mammogram for malignant neoplasm of breast: Secondary | ICD-10-CM | POA: Insufficient documentation

## 2017-09-29 DIAGNOSIS — N6322 Unspecified lump in the left breast, upper inner quadrant: Secondary | ICD-10-CM | POA: Insufficient documentation

## 2017-09-29 DIAGNOSIS — R928 Other abnormal and inconclusive findings on diagnostic imaging of breast: Secondary | ICD-10-CM | POA: Diagnosis not present

## 2017-10-11 ENCOUNTER — Telehealth: Payer: Self-pay

## 2017-10-11 MED ORDER — LUBIPROSTONE 8 MCG PO CAPS: 8.0000 ug | ORAL_CAPSULE | Freq: Two times a day (BID) | ORAL | 0 refills | Status: DC

## 2017-10-11 NOTE — Telephone Encounter (Signed)
Patient stated that Amitiza has been working well for her.  She would like the 31mcg rx sent to pharmacy she felt the higher dose was too strong-caused her to have an accident.  I informed her that I would let you know and send in rx for a 90day to her pharmacy.  Thanks [employer]

## 2017-10-12 DIAGNOSIS — D2261 Melanocytic nevi of right upper limb, including shoulder: Secondary | ICD-10-CM | POA: Diagnosis not present

## 2017-10-12 DIAGNOSIS — D2271 Melanocytic nevi of right lower limb, including hip: Secondary | ICD-10-CM | POA: Diagnosis not present

## 2017-10-12 DIAGNOSIS — D485 Neoplasm of uncertain behavior of skin: Secondary | ICD-10-CM | POA: Diagnosis not present

## 2017-10-12 DIAGNOSIS — L814 Other melanin hyperpigmentation: Secondary | ICD-10-CM | POA: Diagnosis not present

## 2017-10-12 DIAGNOSIS — D2272 Melanocytic nevi of left lower limb, including hip: Secondary | ICD-10-CM | POA: Diagnosis not present

## 2017-10-12 DIAGNOSIS — D225 Melanocytic nevi of trunk: Secondary | ICD-10-CM | POA: Diagnosis not present

## 2017-10-12 DIAGNOSIS — D2262 Melanocytic nevi of left upper limb, including shoulder: Secondary | ICD-10-CM | POA: Diagnosis not present

## 2017-10-12 DIAGNOSIS — L308 Other specified dermatitis: Secondary | ICD-10-CM | POA: Diagnosis not present

## 2017-10-12 DIAGNOSIS — X32XXXA Exposure to sunlight, initial encounter: Secondary | ICD-10-CM | POA: Diagnosis not present

## 2017-10-12 DIAGNOSIS — L820 Inflamed seborrheic keratosis: Secondary | ICD-10-CM | POA: Diagnosis not present

## 2017-10-12 DIAGNOSIS — L304 Erythema intertrigo: Secondary | ICD-10-CM | POA: Diagnosis not present

## 2017-11-19 ENCOUNTER — Other Ambulatory Visit: Payer: Self-pay | Admitting: Family Medicine

## 2017-11-19 DIAGNOSIS — E039 Hypothyroidism, unspecified: Secondary | ICD-10-CM

## 2017-11-20 ENCOUNTER — Other Ambulatory Visit: Payer: Medicare HMO

## 2017-11-20 DIAGNOSIS — E039 Hypothyroidism, unspecified: Secondary | ICD-10-CM | POA: Diagnosis not present

## 2017-11-20 DIAGNOSIS — M2392 Unspecified internal derangement of left knee: Secondary | ICD-10-CM | POA: Diagnosis not present

## 2017-11-20 DIAGNOSIS — M1712 Unilateral primary osteoarthritis, left knee: Secondary | ICD-10-CM | POA: Diagnosis not present

## 2017-11-20 DIAGNOSIS — M25562 Pain in left knee: Secondary | ICD-10-CM | POA: Diagnosis not present

## 2017-11-20 DIAGNOSIS — G8929 Other chronic pain: Secondary | ICD-10-CM | POA: Diagnosis not present

## 2017-11-20 DIAGNOSIS — M1711 Unilateral primary osteoarthritis, right knee: Secondary | ICD-10-CM | POA: Diagnosis not present

## 2017-11-21 LAB — TSH: TSH: 0.411 u[IU]/mL — ABNORMAL LOW (ref 0.450–4.500)

## 2017-11-29 DIAGNOSIS — H2513 Age-related nuclear cataract, bilateral: Secondary | ICD-10-CM | POA: Diagnosis not present

## 2017-12-04 DIAGNOSIS — Z0100 Encounter for examination of eyes and vision without abnormal findings: Secondary | ICD-10-CM | POA: Diagnosis not present

## 2017-12-04 DIAGNOSIS — R69 Illness, unspecified: Secondary | ICD-10-CM | POA: Diagnosis not present

## 2018-01-21 ENCOUNTER — Other Ambulatory Visit: Payer: Self-pay | Admitting: Family Medicine

## 2018-01-21 DIAGNOSIS — E039 Hypothyroidism, unspecified: Secondary | ICD-10-CM

## 2018-01-22 ENCOUNTER — Ambulatory Visit (INDEPENDENT_AMBULATORY_CARE_PROVIDER_SITE_OTHER): Payer: Medicare HMO | Admitting: Family Medicine

## 2018-01-22 ENCOUNTER — Encounter: Payer: Self-pay | Admitting: Family Medicine

## 2018-01-22 VITALS — BP 130/78 | HR 72 | Ht 63.0 in | Wt 149.0 lb

## 2018-01-22 DIAGNOSIS — E782 Mixed hyperlipidemia: Secondary | ICD-10-CM | POA: Diagnosis not present

## 2018-01-22 DIAGNOSIS — E039 Hypothyroidism, unspecified: Secondary | ICD-10-CM

## 2018-01-22 DIAGNOSIS — Z23 Encounter for immunization: Secondary | ICD-10-CM | POA: Diagnosis not present

## 2018-01-22 DIAGNOSIS — J309 Allergic rhinitis, unspecified: Secondary | ICD-10-CM | POA: Diagnosis not present

## 2018-01-22 MED ORDER — MONTELUKAST SODIUM 10 MG PO TABS: 10.0000 mg | ORAL_TABLET | Freq: Every day | ORAL | 1 refills | Status: DC

## 2018-01-22 MED ORDER — LOVASTATIN 20 MG PO TABS: 20.0000 mg | ORAL_TABLET | Freq: Every day | ORAL | 1 refills | Status: DC

## 2018-01-22 MED ORDER — LEVOTHYROXINE SODIUM 88 MCG PO TABS: 88.0000 ug | ORAL_TABLET | Freq: Every day | ORAL | 1 refills | Status: DC

## 2018-01-22 MED ORDER — FLUTICASONE PROPIONATE 50 MCG/ACT NA SUSP: 1.0000 | Freq: Every day | NASAL | 11 refills | Status: DC

## 2018-01-22 NOTE — Assessment & Plan Note (Addendum)
Chronic Recurrent. Continue Flonase nasal, singulair 10 mg daily and zyrtec 10mg  daily.

## 2018-01-22 NOTE — Assessment & Plan Note (Signed)
Chronic Controlled Will check TSH and adjust levothyroxine according.

## 2018-01-22 NOTE — Assessment & Plan Note (Signed)
Chronic Controlled Continue lovastatin 20 mg daily.  Will check lipid panel.

## 2018-01-22 NOTE — Progress Notes (Signed)
Name: Pamela Hendrix   MRN: [id number]    DOB: [birth year]   Date:01/22/2018       Progress Note  Subjective  Chief Complaint  Chief Complaint  Patient presents with  . Allergic Rhinitis   . Hypothyroidism  . Hyperlipidemia    Follow up for medication refill including allergic rhinitis.  Hyperlipidemia  This is a chronic problem. The current episode started more than 1 year ago. The problem is controlled. Recent lipid tests were reviewed and are normal. She has no history of chronic renal disease or obesity. Pertinent negatives include no chest pain, focal sensory loss, focal weakness, leg pain, myalgias or shortness of breath. Current antihyperlipidemic treatment includes statins. The current treatment provides moderate improvement of lipids. There are no compliance problems.  Risk factors for coronary artery disease include dyslipidemia and post-menopausal.  Thyroid Problem  Presents for follow-up visit. Symptoms include cold intolerance, constipation, depressed mood, fatigue, hoarse voice and palpitations. Patient reports no anxiety, diaphoresis, diarrhea, dry skin, hair loss, heat intolerance, leg swelling, menstrual problem, nail problem, tremors, visual change, weight gain or weight loss. The symptoms have been stable. Her past medical history is significant for hyperlipidemia.    Chronic allergic rhinitis Chronic Recurrent. Continue Flonase nasal, singulair 10 mg daily and zyrtec '10mg'$  daily.   Hypothyroid Chronic Controlled Will check TSH and adjust levothyroxine according.  Mixed hyperlipidemia Chronic Controlled Continue lovastatin 20 mg daily.  Will check lipid panel.    Past Medical History:  Diagnosis Date  . Allergy   . Diverticulosis   . GERD (gastroesophageal reflux disease)   . Hypertension   . Thyroid disease     Past Surgical History:  Procedure Laterality Date  . CHOLECYSTECTOMY    . COLONOSCOPY  06/02/2014   cleared for 10 yrs- Dr Rayann Heman  .  COLONOSCOPY N/A 04/07/2017   Procedure: COLONOSCOPY;  Surgeon: Lin Landsman, MD;  Location: [facility] ENDOSCOPY;  Service: Gastroenterology;  Laterality: N/A;  . ESOPHAGEAL DILATION    . ESOPHAGOGASTRODUODENOSCOPY N/A 04/07/2017   Procedure: ESOPHAGOGASTRODUODENOSCOPY (EGD);  Surgeon: Lin Landsman, MD;  Location: [facility] ENDOSCOPY;  Service: Gastroenterology;  Laterality: N/A;  . TONSILLECTOMY    . UPPER GI ENDOSCOPY  06/02/2014    Family History  Problem Relation Age of Onset  . Kidney disease Mother   . Cancer Mother        brain  . Heart disease Father   . Heart attack Father   . Lupus Sister   . Diabetes Sister   . Thyroid disease Sister   . Rheum arthritis Sister   . Diabetes Sister   . Multiple myeloma Sister   . Thyroid disease Sister   . Rheum arthritis Sister   . Breast cancer Neg Hx     Social History   Socioeconomic History  . Marital status: Married    Spouse name: Doren Custard  . Number of children: 1  . Years of education: some college  . Highest education level: 12th grade  Occupational History  . Occupation: Retired  Scientific laboratory technician  . Financial resource strain: Not hard at all  . Food insecurity:    Worry: Never true    Inability: Never true  . Transportation needs:    Medical: No    Non-medical: No  Tobacco Use  . Smoking status: Never Smoker  . Smokeless tobacco: Never Used  . Tobacco comment: smoking cessation materials not required  Substance and Sexual Activity  . Alcohol use: No    Alcohol/week:  0.0 standard drinks  . Drug use: No  . Sexual activity: Never  Lifestyle  . Physical activity:    Days per week: 0 days    Minutes per session: 0 min  . Stress: Not at all  Relationships  . Social connections:    Talks on phone: Patient refused    Gets together: Patient refused    Attends religious service: Patient refused    Active member of club or organization: Patient refused    Attends meetings of clubs or organizations: Patient  refused    Relationship status: Married  . Intimate partner violence:    Fear of current or ex partner: No    Emotionally abused: No    Physically abused: No    Forced sexual activity: No  Other Topics Concern  . Not on file  Social History Narrative  . Not on file    Allergies  Allergen Reactions  . Codeine Nausea Only and Nausea And Vomiting    Outpatient Medications Prior to Visit  Medication Sig Dispense Refill  . cetirizine (ZYRTEC) 10 MG tablet Take 10 mg by mouth daily. otc    . Cholecalciferol (VITAMIN D-3) 1000 units CAPS Take 1 capsule by mouth daily.    Marland Kitchen lubiprostone (AMITIZA) 8 MCG capsule Take 1 capsule (8 mcg total) by mouth 2 (two) times daily with a meal. (Patient taking differently: Take 8 mcg by mouth 2 (two) times daily with a meal. GI doc) 180 capsule 0  . Calcium-Magnesium-Vitamin D (CORAL CALCIUM) 185-50-100 MG-MG-UNIT CAPS Take by mouth.    . fluticasone (FLONASE) 50 MCG/ACT nasal spray Place 1 spray into both nostrils daily. 16 g 11  . levothyroxine (SYNTHROID, LEVOTHROID) 88 MCG tablet TAKE 1 TABLET BY MOUTH ONCE DAILY 30 tablet 1  . lovastatin (MEVACOR) 20 MG tablet Take 1 tablet (20 mg total) by mouth daily. 30 tablet 5  . montelukast (SINGULAIR) 10 MG tablet Take 1 tablet (10 mg total) by mouth daily. 90 tablet 1  . vitamin B-12 (CYANOCOBALAMIN) 1000 MCG tablet Take 1,000 mcg by mouth daily.     No facility-administered medications prior to visit.     Review of Systems  Constitutional: Positive for fatigue. Negative for chills, diaphoresis, fever, malaise/fatigue, weight gain and weight loss.  HENT: Positive for hoarse voice. Negative for ear discharge, ear pain and sore throat.   Eyes: Negative for blurred vision.  Respiratory: Negative for cough, sputum production, shortness of breath and wheezing.   Cardiovascular: Positive for palpitations. Negative for chest pain and leg swelling.  Gastrointestinal: Positive for constipation. Negative for  abdominal pain, blood in stool, diarrhea, heartburn, melena and nausea.  Genitourinary: Negative for dysuria, frequency, hematuria, menstrual problem and urgency.  Musculoskeletal: Negative for back pain, joint pain, myalgias and neck pain.  Skin: Negative for rash.  Neurological: Negative for dizziness, tingling, tremors, sensory change, focal weakness and headaches.  Endo/Heme/Allergies: Positive for cold intolerance. Negative for environmental allergies, heat intolerance and polydipsia. Does not bruise/bleed easily.  Psychiatric/Behavioral: Negative for depression and suicidal ideas. The patient is not nervous/anxious and does not have insomnia.      Objective  Vitals:   01/22/18 0802  BP: 130/78  Pulse: 72  Weight: 149 lb (67.6 kg)  Height: '5\' 3"'$  (1.6 m)    Physical Exam  Constitutional: She is oriented to person, place, and time. She appears well-developed and well-nourished.  HENT:  Head: Normocephalic.  Right Ear: External ear normal.  Left Ear: External ear normal.  Mouth/Throat:  Oropharynx is clear and moist.  Eyes: Pupils are equal, round, and reactive to light. Conjunctivae and EOM are normal. Lids are everted and swept, no foreign bodies found. Left eye exhibits no hordeolum. No foreign body present in the left eye. Right conjunctiva is not injected. Left conjunctiva is not injected. No scleral icterus.  Neck: Normal range of motion. Neck supple. No JVD present. No tracheal deviation present. No thyromegaly present.  Cardiovascular: Normal rate, regular rhythm, S1 normal, S2 normal, normal heart sounds and intact distal pulses. PMI is not displaced. Exam reveals no gallop, no S3, no S4, no distant heart sounds and no friction rub.  No murmur heard.  No systolic murmur is present.  No diastolic murmur is present. Pulses:      Carotid pulses are 2+ on the right side, and 2+ on the left side.      Radial pulses are 2+ on the right side, and 2+ on the left side.        Femoral pulses are 2+ on the right side, and 2+ on the left side.      Popliteal pulses are 2+ on the right side, and 2+ on the left side.       Dorsalis pedis pulses are 2+ on the right side, and 2+ on the left side.       Posterior tibial pulses are 2+ on the right side, and 2+ on the left side.  Pulmonary/Chest: Effort normal and breath sounds normal. No respiratory distress. She has no wheezes. She has no rales.  Abdominal: Soft. Bowel sounds are normal. She exhibits no mass. There is no hepatosplenomegaly. There is no tenderness. There is no rebound and no guarding.  Musculoskeletal: Normal range of motion. She exhibits no edema or tenderness.  Lymphadenopathy:    She has no cervical adenopathy.  Neurological: She is alert and oriented to person, place, and time. She has normal strength. She displays normal reflexes. No cranial nerve deficit.  Skin: Skin is warm. No rash noted.  Psychiatric: She has a normal mood and affect. Her mood appears not anxious. She does not exhibit a depressed mood.  Nursing note and vitals reviewed.     Assessment & Plan  Problem List Items Addressed This Visit      Respiratory   Chronic allergic rhinitis    Chronic Recurrent. Continue Flonase nasal, singulair 10 mg daily and zyrtec '10mg'$  daily.       Relevant Medications   fluticasone (FLONASE) 50 MCG/ACT nasal spray     Endocrine   Hypothyroid - Primary    Chronic Controlled Will check TSH and adjust levothyroxine according.      Relevant Medications   levothyroxine (SYNTHROID, LEVOTHROID) 88 MCG tablet   Other Relevant Orders   TSH     Other   Mixed hyperlipidemia    Chronic Controlled Continue lovastatin 20 mg daily.  Will check lipid panel.       Relevant Medications   lovastatin (MEVACOR) 20 MG tablet   Other Relevant Orders   Lipid panel    Other Visit Diagnoses    Influenza vaccine needed       Discussed and administered   Relevant Orders   Flu vaccine HIGH DOSE PF (Fluzone  High dose) (Completed)      Meds ordered this encounter  Medications  . fluticasone (FLONASE) 50 MCG/ACT nasal spray    Sig: Place 1 spray into both nostrils daily.    Dispense:  16 g  Refill:  11  . levothyroxine (SYNTHROID, LEVOTHROID) 88 MCG tablet    Sig: Take 1 tablet (88 mcg total) by mouth daily.    Dispense:  90 tablet    Refill:  1    Please consider 90 day supplies to promote better adherence  . montelukast (SINGULAIR) 10 MG tablet    Sig: Take 1 tablet (10 mg total) by mouth daily.    Dispense:  90 tablet    Refill:  1    Needs appt  . lovastatin (MEVACOR) 20 MG tablet    Sig: Take 1 tablet (20 mg total) by mouth daily.    Dispense:  90 tablet    Refill:  1    Please consider 90 day supplies to promote better adherence      Dr. Otilio Miu [facility]  01/22/18

## 2018-01-23 LAB — LIPID PANEL
Chol/HDL Ratio: 2.3 ratio (ref 0.0–4.4)
Cholesterol, Total: 145 mg/dL (ref 100–199)
HDL: 64 mg/dL (ref >39)
LDL Calculated: 64 mg/dL (ref 0–99)
Triglycerides: 84 mg/dL (ref 0–149)
VLDL Cholesterol Cal: 17 mg/dL (ref 5–40)

## 2018-01-23 LAB — TSH: TSH: 0.522 u[IU]/mL (ref 0.450–4.500)

## 2018-01-24 ENCOUNTER — Other Ambulatory Visit: Payer: Self-pay | Admitting: Gastroenterology

## 2018-03-16 ENCOUNTER — Telehealth: Payer: Self-pay

## 2018-03-16 NOTE — Telephone Encounter (Signed)
Faxedc to [facility]

## 2018-06-30 ENCOUNTER — Other Ambulatory Visit: Payer: Self-pay | Admitting: Gastroenterology

## 2018-07-13 ENCOUNTER — Telehealth: Payer: Self-pay | Admitting: Family Medicine

## 2018-07-13 DIAGNOSIS — R001 Bradycardia, unspecified: Secondary | ICD-10-CM | POA: Insufficient documentation

## 2018-07-13 DIAGNOSIS — R9431 Abnormal electrocardiogram [ECG] [EKG]: Secondary | ICD-10-CM | POA: Insufficient documentation

## 2018-07-13 DIAGNOSIS — E782 Mixed hyperlipidemia: Secondary | ICD-10-CM | POA: Diagnosis not present

## 2018-07-13 DIAGNOSIS — I10 Essential (primary) hypertension: Secondary | ICD-10-CM | POA: Diagnosis not present

## 2018-07-13 NOTE — Telephone Encounter (Signed)
Called pt to let her know that the dr will not give any medication to patients till they have had their NP appt. Looked and no avail any sooner and she said that her old dr will not refill till she has lab work. Told her about [facility]

## 2018-07-13 NOTE — Telephone Encounter (Signed)
Copied from [place] [id number]. Topic: Quick Communication - Rx Refill/Question >> Jul 13, 2018 10:02 AM Rutherford Nail, NT wrote: **Patient calling and states that when she made her New Patient appointment with Dr Sanda Klein, she was not aware that she would run out of this medication before her appointment. States that she has already changed her PCP information with insurance and cannot make an appointment with her previous PCP. Would like to know if Dr Sanda Klein could send in enough to last until her appt on 08/03/2018?**   Medication: levothyroxine (SYNTHROID, LEVOTHROID) 88 MCG tablet   Has the patient contacted their pharmacy? Yes.   (Agent: If no, request that the patient contact the pharmacy for the refill.) (Agent: If yes, when and what did the pharmacy advise?)  Preferred Pharmacy (with phone number or street name): [facility], [place]: Please be advised that RX refills may take up to 3 business days. We ask that you follow-up with your pharmacy.

## 2018-07-23 ENCOUNTER — Ambulatory Visit: Payer: Self-pay | Admitting: Family Medicine

## 2018-07-31 DIAGNOSIS — E782 Mixed hyperlipidemia: Secondary | ICD-10-CM | POA: Diagnosis not present

## 2018-07-31 DIAGNOSIS — R001 Bradycardia, unspecified: Secondary | ICD-10-CM | POA: Diagnosis not present

## 2018-07-31 DIAGNOSIS — Q2112 Patent foramen ovale: Secondary | ICD-10-CM | POA: Insufficient documentation

## 2018-07-31 DIAGNOSIS — I10 Essential (primary) hypertension: Secondary | ICD-10-CM | POA: Diagnosis not present

## 2018-07-31 DIAGNOSIS — R9431 Abnormal electrocardiogram [ECG] [EKG]: Secondary | ICD-10-CM | POA: Diagnosis not present

## 2018-07-31 DIAGNOSIS — Q211 Atrial septal defect: Secondary | ICD-10-CM | POA: Diagnosis not present

## 2018-08-03 ENCOUNTER — Ambulatory Visit: Payer: Self-pay | Admitting: Family Medicine

## 2018-08-06 ENCOUNTER — Telehealth: Payer: Self-pay

## 2018-08-06 ENCOUNTER — Other Ambulatory Visit: Payer: Self-pay | Admitting: Family Medicine

## 2018-08-06 DIAGNOSIS — Z760 Encounter for issue of repeat prescription: Secondary | ICD-10-CM | POA: Diagnosis not present

## 2018-08-06 DIAGNOSIS — E039 Hypothyroidism, unspecified: Secondary | ICD-10-CM

## 2018-08-06 NOTE — Telephone Encounter (Signed)
Per Berton Mount patient needs follow up for med refills in March or April. Needs to be seen to get meds Cancelled the 07/23/18 appt. I left message that she needs OV to get refills and to call and schedule this in morning only due to [place] and other illness visits being seen in afternoons.

## 2018-08-14 DIAGNOSIS — R410 Disorientation, unspecified: Secondary | ICD-10-CM | POA: Diagnosis not present

## 2018-08-14 DIAGNOSIS — G43109 Migraine with aura, not intractable, without status migrainosus: Secondary | ICD-10-CM | POA: Diagnosis not present

## 2018-08-15 IMAGING — MG MM DIGITAL SCREENING BILAT W/ TOMO W/ CAD
8 of 14 series · 8 of 30 positions shown · non-contrast
Comparison: Previous exam(s).

CLINICAL DATA: Screening.

EXAM:
2D DIGITAL SCREENING BILATERAL MAMMOGRAM WITH CAD AND ADJUNCT TOMO

[L CC]
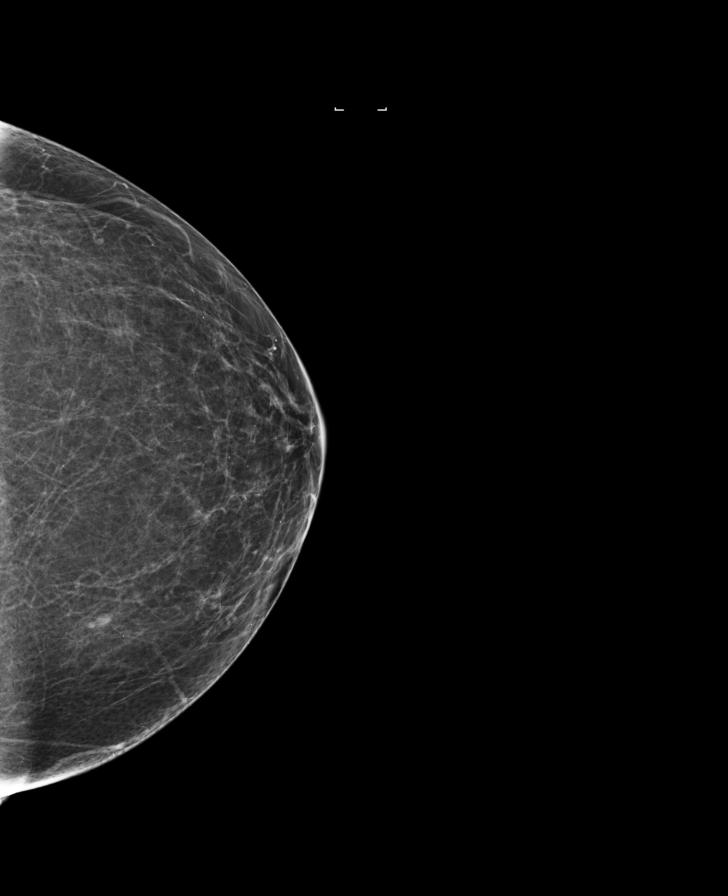

[R MLO synth-2D]
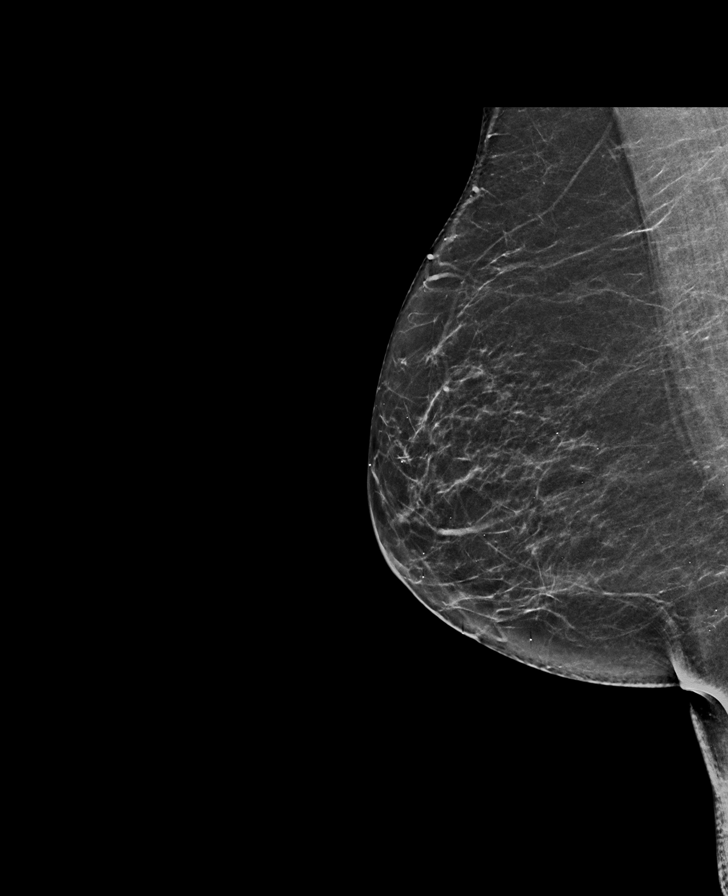

[L MLO synth-2D]
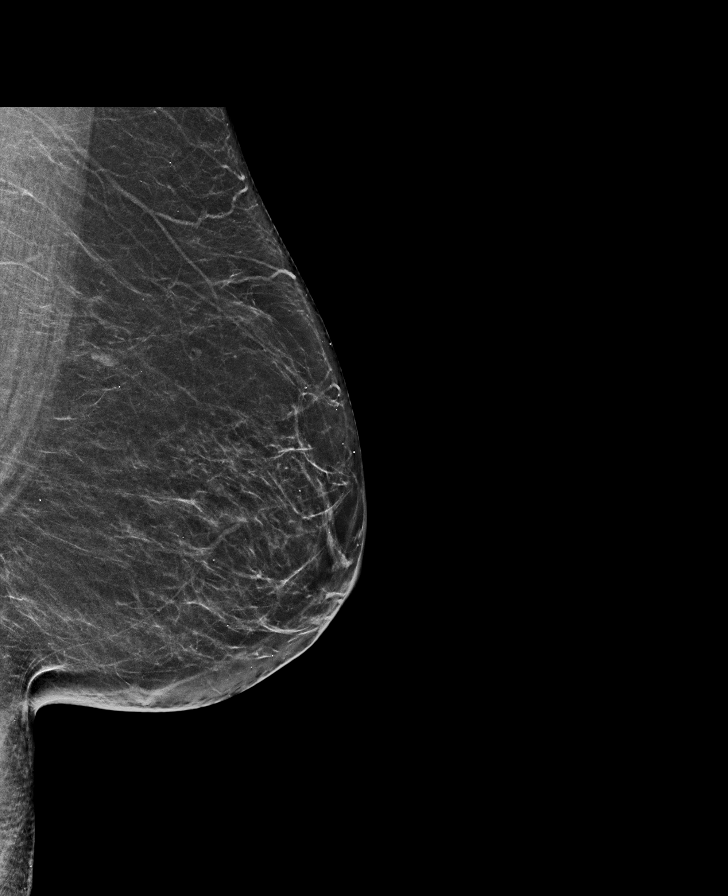

[L MLO]
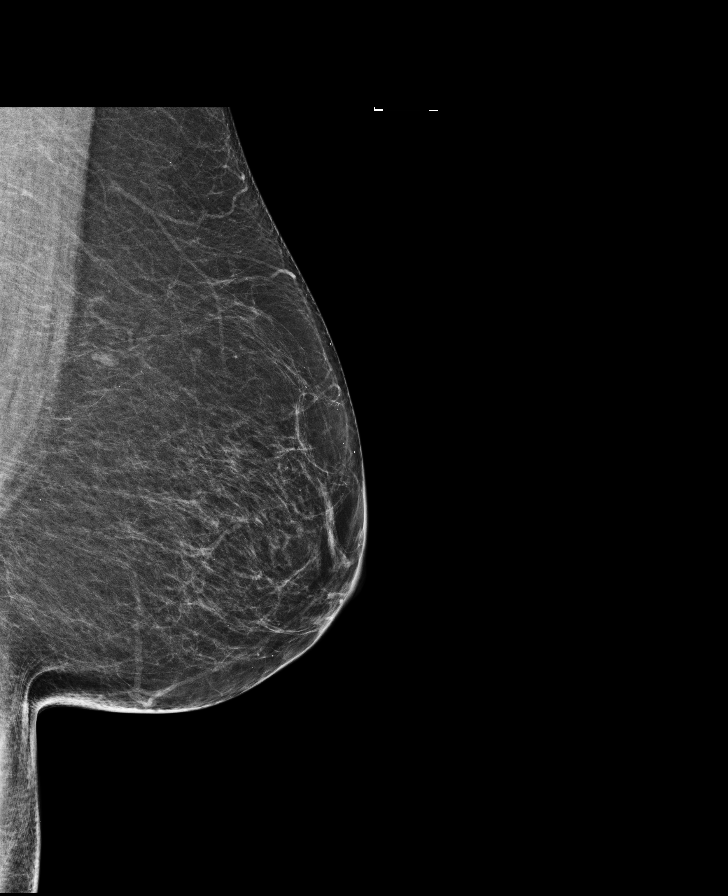

[L CC synth-2D]
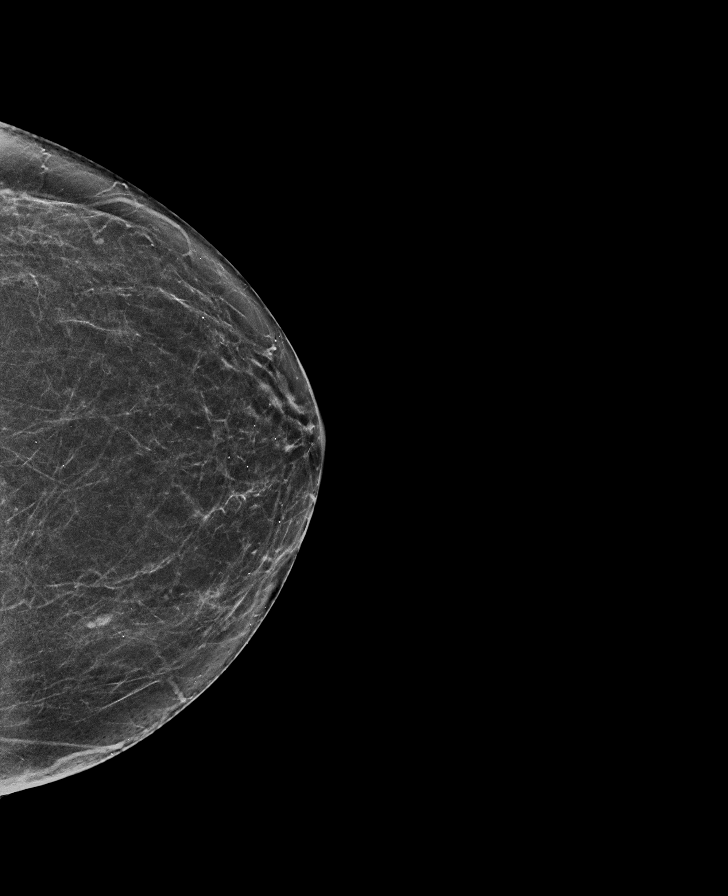

[R CC]
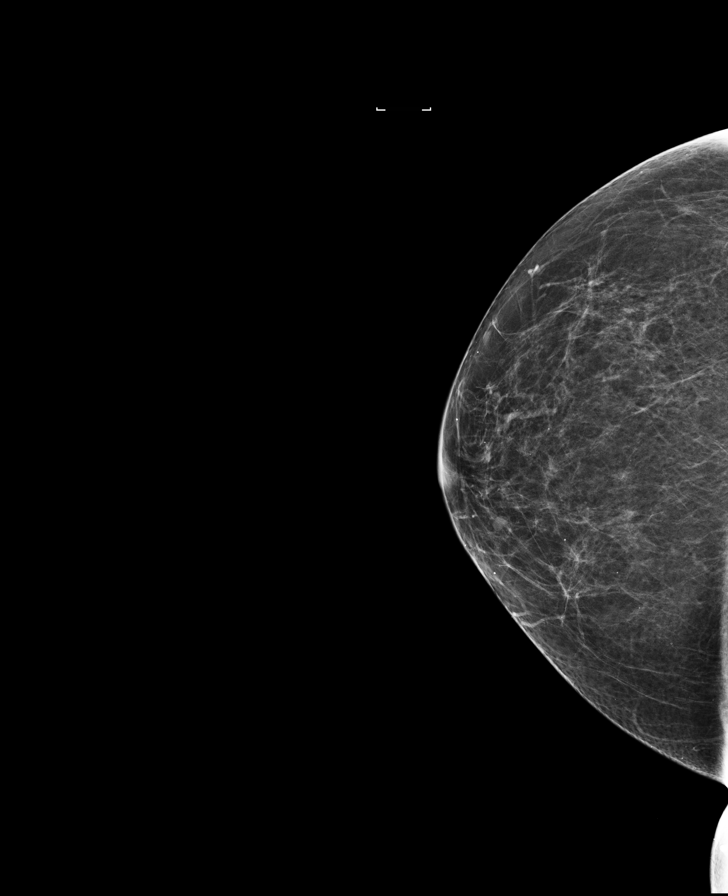

[R CC synth-2D]
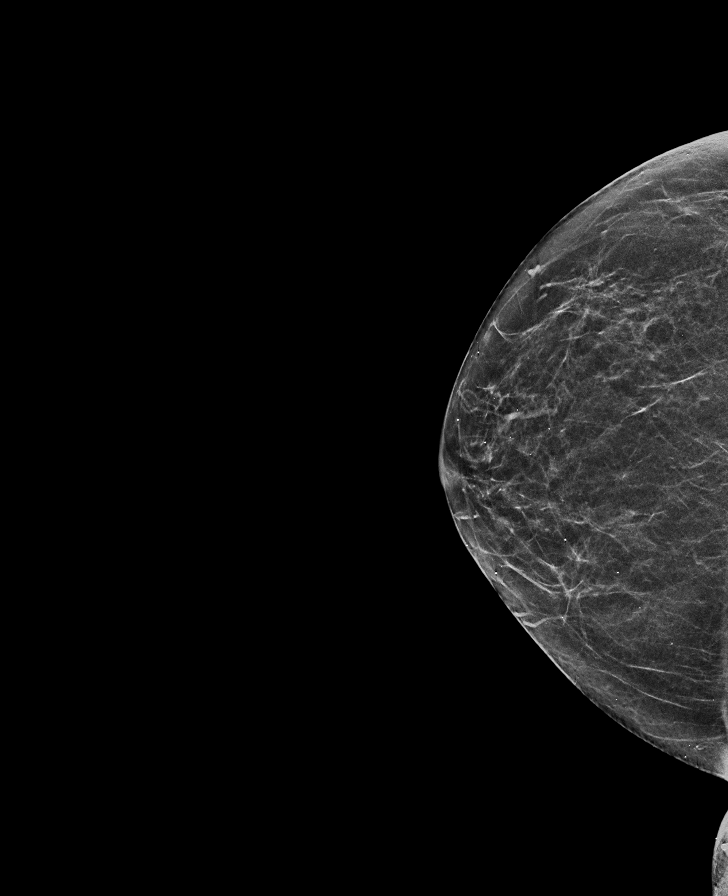

[R MLO]
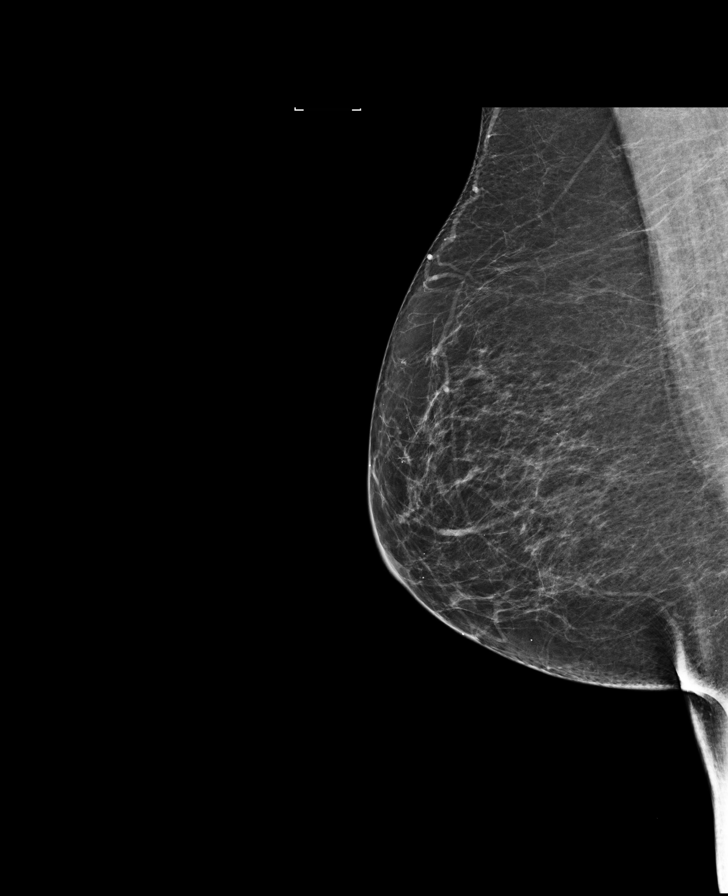

[8 of 30 positions shown; findings below may reference images not displayed]

ACR Breast Density Category b: There are scattered areas of
fibroglandular density.
FINDINGS: In the left breast, a possible mass warrants further evaluation. In
the right breast, no findings suspicious for malignancy.

Images were processed with CAD.
IMPRESSION: Further evaluation is suggested for possible mass in the left
breast.

RECOMMENDATION:
Diagnostic mammogram and possibly ultrasound of the left breast.
(Code:[id number])

The patient will be contacted regarding the findings, and additional
imaging will be scheduled.

BI-RADS CATEGORY  0: Incomplete. Need additional imaging evaluation
and/or prior mammograms for comparison.

## 2018-09-24 ENCOUNTER — Other Ambulatory Visit: Payer: Self-pay

## 2018-09-24 ENCOUNTER — Encounter: Payer: Self-pay | Admitting: Family Medicine

## 2018-09-24 ENCOUNTER — Ambulatory Visit (INDEPENDENT_AMBULATORY_CARE_PROVIDER_SITE_OTHER): Payer: Medicare HMO | Admitting: Family Medicine

## 2018-09-24 VITALS — BP 128/72 | HR 69 | Temp 98.0°F | Resp 16 | Ht 64.0 in | Wt 151.9 lb

## 2018-09-24 DIAGNOSIS — E039 Hypothyroidism, unspecified: Secondary | ICD-10-CM

## 2018-09-24 DIAGNOSIS — I10 Essential (primary) hypertension: Secondary | ICD-10-CM | POA: Diagnosis not present

## 2018-09-24 DIAGNOSIS — R200 Anesthesia of skin: Secondary | ICD-10-CM

## 2018-09-24 DIAGNOSIS — N898 Other specified noninflammatory disorders of vagina: Secondary | ICD-10-CM

## 2018-09-24 DIAGNOSIS — J309 Allergic rhinitis, unspecified: Secondary | ICD-10-CM

## 2018-09-24 DIAGNOSIS — R519 Headache, unspecified: Secondary | ICD-10-CM

## 2018-09-24 DIAGNOSIS — E782 Mixed hyperlipidemia: Secondary | ICD-10-CM | POA: Diagnosis not present

## 2018-09-24 DIAGNOSIS — R51 Headache: Secondary | ICD-10-CM

## 2018-09-24 DIAGNOSIS — Z8719 Personal history of other diseases of the digestive system: Secondary | ICD-10-CM

## 2018-09-24 DIAGNOSIS — R202 Paresthesia of skin: Secondary | ICD-10-CM

## 2018-09-24 MED ORDER — MONTELUKAST SODIUM 10 MG PO TABS: 10.0000 mg | ORAL_TABLET | Freq: Every day | ORAL | 3 refills | Status: DC

## 2018-09-24 MED ORDER — LOVASTATIN 20 MG PO TABS: 20.0000 mg | ORAL_TABLET | Freq: Every day | ORAL | 3 refills | Status: DC

## 2018-09-24 NOTE — Progress Notes (Signed)
Name: Pamela Hendrix   MRN: [id number]    DOB: Mar 28, 1946   Date:09/24/2018       Progress Note  Subjective  Chief Complaint  Chief Complaint  Patient presents with  . Establish Care  . Hypothyroidism    medication refills  . Hyperlipidemia  . Allergic Rhinitis     I connected with  [name]  on 09/24/18 at  1:20 PM EDT by a video enabled telemedicine application and verified that I am speaking with the correct person using two identifiers.  I discussed the limitations of evaluation and management by telemedicine and the availability of in person appointments. The patient expressed understanding and agreed to proceed. Staff also discussed with the patient that there may be a patient responsible charge related to this service. Patient Location: Office Provider Location: Home Additional Individuals present: None  HPI   Pt presents to establish care with our clinic - Dr. Ronnald Ramp - w/ [facility] former PCP, her most recent notes are reviewed.  Hypothyroidism: Has had for over 50 years; due for TSH check. +Cold intolerance, has intermittent diarrhea/constipation (has hx diverticulosis and is followed by GI).  Denies palpitations, heat intolerance.  Does have brittle nails and dry skin.  Hx Diverticulitis/IBS/hx anemia: was put put on Amitza for 4 months after have GI bleed and requiring blood transfusion.  She has stopped the Amitiza - it did help, but it was very expensive.  She is due for follow up with Dr. Marius Ditch and will call to schedule.  She denies any rectal bleeding.  Does have occasional abdominal pain (mild, and is baseline for her)  Abnormal EKG/Hyperlipidemia: On lovastatin, due for check of Lipids.  Denies chest pain, occasional shortness of breath with exertion.  Does see Dr. Nehemiah Massed for hx bradycardia and abnormal EKG. Taking ASA daily. Had recent echo in 07/31/2018 did show some abnormalities, results are available and reviewed in care everywhere  through [facility].  HTN: Seeing Dr. Nehemiah Massed - recommended ACE-I, but patient wanted to wait.  She denies chest pain; does get some mild BLE edema - non-pitting, and does resolve overnight with elevated  AR: Taking Zyrtec, Flonase, and Singulair.  She is made aware of new black box warning for singulair, but would like to continue medication.  Symptoms include nasal congestion and cough.  Typically worse in the spring, but also has symptoms year round.  ?Hx TIA/Migraine aura : Seeing Dr. Manuella Ghazi, she was told the sx's were likely migraine aura and not TIA's.  She had extensive work-up- 24-hour and 3-day EEG, has history of complex migraine as a teenager, and again in 2015.  She has had multiple episodes since of tunnel vision, dizziness, weakness, confusion, and sweating; no recent episodes.  Normal MRI brain 03/17/2016.  She is taking magnesium supplements and will continue follow-up with neurology.  Vaginal Itching: ongoing for several years, tried a cream and this did not help much.  Recently she noticed a singular bump x several months.  The lesion is reddish and non-tender, no bleeding or exudate.  Patient Active Problem List   Diagnosis Date Noted  . Primary osteoarthritis of left knee 11/20/2017  . History of GI diverticular bleed 05/18/2017  . Vitamin B12 deficiency anemia 05/18/2017  . Chronic allergic rhinitis 06/28/2016  . Mixed hyperlipidemia 06/28/2016  . H/O TIA (transient ischemic attack) and stroke 08/28/2014  . Hiatal hernia 08/28/2014  . Hypercholesteremia 08/28/2014  . Hypothyroid 08/28/2014  . Headache 10/25/2013  . Hypertension 10/25/2013  .  Numbness and tingling 10/25/2013  . Obesity 10/25/2013  . Spells 10/25/2013  . TIA (transient ischemic attack) 10/25/2013  . Visual disturbance 10/25/2013  . Thyroid nodule 09/19/2013  . Onychomycosis due to dermatophyte 08/21/2013    Past Surgical History:  Procedure Laterality Date  . CHOLECYSTECTOMY    . COLONOSCOPY  06/02/2014    cleared for 10 yrs- Dr Rayann Heman  . COLONOSCOPY N/A 04/07/2017   Procedure: COLONOSCOPY;  Surgeon: Lin Landsman, MD;  Location: [facility] ENDOSCOPY;  Service: Gastroenterology;  Laterality: N/A;  . ESOPHAGEAL DILATION    . ESOPHAGOGASTRODUODENOSCOPY N/A 04/07/2017   Procedure: ESOPHAGOGASTRODUODENOSCOPY (EGD);  Surgeon: Lin Landsman, MD;  Location: [facility] ENDOSCOPY;  Service: Gastroenterology;  Laterality: N/A;  . TONSILLECTOMY    . UPPER GI ENDOSCOPY  06/02/2014    Family History  Problem Relation Age of Onset  . Kidney disease Mother   . Cancer Mother        brain  . Heart disease Father   . Heart attack Father   . Lupus Sister   . Diabetes Sister   . Thyroid disease Sister   . Rheum arthritis Sister   . Diabetes Sister   . Multiple myeloma Sister   . Thyroid disease Sister   . Rheum arthritis Sister   . Breast cancer Neg Hx     Social History   Socioeconomic History  . Marital status: Married    Spouse name: Doren Custard  . Number of children: 1  . Years of education: some college  . Highest education level: 12th grade  Occupational History  . Occupation: Retired  Scientific laboratory technician  . Financial resource strain: Not hard at all  . Food insecurity:    Worry: Never true    Inability: Never true  . Transportation needs:    Medical: No    Non-medical: No  Tobacco Use  . Smoking status: Never Smoker  . Smokeless tobacco: Never Used  . Tobacco comment: smoking cessation materials not required  Substance and Sexual Activity  . Alcohol use: No    Alcohol/week: 0.0 standard drinks  . Drug use: No  . Sexual activity: Never  Lifestyle  . Physical activity:    Days per week: 0 days    Minutes per session: 0 min  . Stress: Not at all  Relationships  . Social connections:    Talks on phone: Patient refused    Gets together: Patient refused    Attends religious service: Patient refused    Active member of club or organization: Patient refused    Attends meetings of  clubs or organizations: Patient refused    Relationship status: Married  . Intimate partner violence:    Fear of current or ex partner: No    Emotionally abused: No    Physically abused: No    Forced sexual activity: No  Other Topics Concern  . Not on file  Social History Narrative  . Not on file     Current Outpatient Medications:  .  aspirin EC 81 MG tablet, Take 81 mg by mouth daily., Disp: , Rfl:  .  cetirizine (ZYRTEC) 10 MG tablet, Take 10 mg by mouth daily. otc, Disp: , Rfl:  .  Cholecalciferol (VITAMIN D-3) 1000 units CAPS, Take 1 capsule by mouth daily., Disp: , Rfl:  .  fluticasone (FLONASE) 50 MCG/ACT nasal spray, Place 1 spray into both nostrils daily., Disp: 16 g, Rfl: 11 .  levothyroxine (SYNTHROID, LEVOTHROID) 88 MCG tablet, Take 1 tablet by mouth once  daily, Disp: 30 tablet, Rfl: 0 .  lovastatin (MEVACOR) 20 MG tablet, Take 1 tablet (20 mg total) by mouth daily., Disp: 90 tablet, Rfl: 1 .  montelukast (SINGULAIR) 10 MG tablet, Take 1 tablet (10 mg total) by mouth daily., Disp: 90 tablet, Rfl: 1 .  AMITIZA 8 MCG capsule, TAKE 1 CAPSULE BY MOUTH TWICE DAILY WITH A MEAL (Patient not taking: Reported on 09/24/2018), Disp: 180 capsule, Rfl: 0 .  vitamin B-12 (CYANOCOBALAMIN) 1000 MCG tablet, Take 1,000 mcg by mouth daily., Disp: , Rfl:   Allergies  Allergen Reactions  . Codeine Nausea Only and Nausea And Vomiting    I personally reviewed active problem list, medication list, allergies, health maintenance, notes from last encounter, lab results with the patient/caregiver today.   ROS Constitutional: Negative for fever or weight change.  Respiratory: Negative for cough; does have some shortness of breath with exertion onlny.   Cardiovascular: Negative for chest pain or palpitations.  Gastrointestinal: Negative for abdominal pain, no bowel changes.  Musculoskeletal: Negative for gait problem or joint swelling.  Skin: Negative for rash.  Neurological: Negative for  dizziness; positive for occasional headache.  No other specific complaints in a complete review of systems (except as listed in HPI above).   Objective  Today's Vitals   09/24/18 1321  BP: 128/72  Pulse: 69  Resp: 16  Temp: 98 F (36.7 C)  TempSrc: Oral  SpO2: 99%  Weight: 151 lb 14.4 oz (68.9 kg)  Height: '5\' 4"'$  (1.626 m)   Body mass index is 26.07 kg/m.   Body mass index is 26.07 kg/m.  Physical Exam Constitutional: Patient appears well-developed and well-nourished. No distress.  HENT: Head: Normocephalic and atraumatic.  Neck: Normal range of motion. Pulmonary/Chest: Effort normal. No respiratory distress. Speaking in complete sentences Neurological: Pt is alert and oriented to person, place, and time. Coordination, speech and gait are normal.  Psychiatric: Patient has a normal mood and affect. behavior is normal. Judgment and thought content normal. GU: Unable to visualize GU exam due to the utilization of video visit.   No results found for this or any previous visit (from the past 72 hour(s)).  PHQ2/9: Depression screen [facility] 2/9 09/24/2018 01/22/2018 09/04/2017 09/07/2016 09/07/2015  Decreased Interest 1 0 0 1 0  Down, Depressed, Hopeless 0 1 0 1 0  PHQ - 2 Score 1 1 0 2 0  Altered sleeping 0 0 0 0 -  Tired, decreased energy 1 0 0 2 -  Change in appetite 1 1 0 2 -  Feeling bad or failure about yourself  0 1 0 1 -  Trouble concentrating 0 0 0 0 -  Moving slowly or fidgety/restless 0 0 0 0 -  Suicidal thoughts 0 0 0 0 -  PHQ-9 Score 3 3 0 7 -  Difficult doing work/chores Not difficult at all Not difficult at all Not difficult at all Somewhat difficult -   PHQ-2/9 Result is negative.    Fall Risk: Fall Risk  09/24/2018 01/22/2018 09/04/2017 09/07/2016 09/07/2015  Falls in the past year? 0 No Yes No No  Comment - - carport fell causing pt to fall - -  Number falls in past yr: 0 - 1 - -  Injury with Fall? 0 - No - -  Risk for fall due to : Other (Comment) - History of  fall(s);Impaired vision - -  Risk for fall due to: Comment - - tripped over vaccum cleaner; wears eyeglasses - -  Follow up  Falls evaluation completed - Falls evaluation completed;Education provided;Falls prevention discussed - -    Assessment & Plan  1. Essential hypertension -Diet discussed, patient is in office for video visit today, and blood pressure is at goal. - COMPLETE METABOLIC PANEL WITH GFR  2. Mixed hyperlipidemia Due for lipid check.  Continue lovastatin. - Lipid panel - lovastatin (MEVACOR) 20 MG tablet; Take 1 tablet (20 mg total) by mouth daily.  Dispense: 90 tablet; Refill: 3  3. Chronic allergic rhinitis Discussed new black box warning for Singulair, she still would like to continue this medication.  Zyrtec seems to be effective at this time, however she has been on for several years, she knows that if efficacy is waning she may switch to Claritin.  Continue Flonase. - montelukast (SINGULAIR) 10 MG tablet; Take 1 tablet (10 mg total) by mouth daily.  Dispense: 90 tablet; Refill: 3  4. Hypothyroidism, unspecified type We will recheck TSH today as she was low end of normal at last check several months ago.  Will adjust dosing accordingly.   - TSH  5. History of GI diverticular bleed Recommend she continue follow-up with GI due to the nature of her IBS and history of diverticular bleed that required transfusion.  We will monitor for anemia today. - CBC w/Diff/Platelet  6. Vaginal lesion Recommend she see GYN for further evaluation of new vaginal lesion to ensure proper evaluation and possible biopsy. - Ambulatory referral to Gynecology  7. Vaginal itching -She has tried Premarin cream in the past, and it has not made much of a difference.  Recommend GYN for further evaluation and possible changes to her medication regimen. - Ambulatory referral to Gynecology  8. Nonintractable episodic headache, unspecified headache type Continue follow-up with neurology.  9.  Numbness and tingling Continue follow-up with neurology.  I discussed the assessment and treatment plan with the patient. The patient was provided an opportunity to ask questions and all were answered. The patient agreed with the plan and demonstrated an understanding of the instructions.  The patient was advised to call back or seek an in-person evaluation if the symptoms worsen or if the condition fails to improve as anticipated.  I provided 36 minutes of non-face-to-face time during this encounter.

## 2018-09-25 ENCOUNTER — Other Ambulatory Visit: Payer: Self-pay | Admitting: Family Medicine

## 2018-09-25 DIAGNOSIS — E039 Hypothyroidism, unspecified: Secondary | ICD-10-CM

## 2018-09-25 LAB — CBC WITH DIFFERENTIAL/PLATELET
Absolute Monocytes: 621 cells/uL (ref 200–950)
Basophils Absolute: 34 cells/uL (ref 0–200)
Basophils Relative: 0.4 %
Eosinophils Absolute: 60 cells/uL (ref 15–500)
Eosinophils Relative: 0.7 %
HCT: 34.2 % — ABNORMAL LOW (ref 35.0–45.0)
Hemoglobin: 11.6 g/dL — ABNORMAL LOW (ref 11.7–15.5)
Lymphs Abs: 1479 cells/uL (ref 850–3900)
MCH: 32.3 pg (ref 27.0–33.0)
MCHC: 33.9 g/dL (ref 32.0–36.0)
MCV: 95.3 fL (ref 80.0–100.0)
MPV: 12.9 fL — ABNORMAL HIGH (ref 7.5–12.5)
Monocytes Relative: 7.3 %
Neutro Abs: 6307 cells/uL (ref 1500–7800)
Neutrophils Relative %: 74.2 %
Platelets: 160 10*3/uL (ref 140–400)
RBC: 3.59 10*6/uL — ABNORMAL LOW (ref 3.80–5.10)
RDW: 13 % (ref 11.0–15.0)
Total Lymphocyte: 17.4 %
WBC: 8.5 10*3/uL (ref 3.8–10.8)

## 2018-09-25 LAB — COMPLETE METABOLIC PANEL WITH GFR
AG Ratio: 1.3 (calc) (ref 1.0–2.5)
ALT: 8 U/L (ref 6–29)
AST: 17 U/L (ref 10–35)
Albumin: 4.2 g/dL (ref 3.6–5.1)
Alkaline phosphatase (APISO): 57 U/L (ref 37–153)
BUN/Creatinine Ratio: 14 (calc) (ref 6–22)
BUN: 16 mg/dL (ref 7–25)
CO2: 29 mmol/L (ref 20–32)
Calcium: 9.4 mg/dL (ref 8.6–10.4)
Chloride: 103 mmol/L (ref 98–110)
Creat: 1.13 mg/dL — ABNORMAL HIGH (ref 0.60–0.93)
GFR, Est African American: 56 mL/min/{1.73_m2} — ABNORMAL LOW (ref >=60)
GFR, Est Non African American: 48 mL/min/{1.73_m2} — ABNORMAL LOW (ref >=60)
Globulin: 3.3 g/dL (calc) (ref 1.9–3.7)
Glucose, Bld: 98 mg/dL (ref 65–99)
Potassium: 4.1 mmol/L (ref 3.5–5.3)
Sodium: 139 mmol/L (ref 135–146)
Total Bilirubin: 0.8 mg/dL (ref 0.2–1.2)
Total Protein: 7.5 g/dL (ref 6.1–8.1)

## 2018-09-25 LAB — LIPID PANEL
Cholesterol: 150 mg/dL (ref <200)
HDL: 64 mg/dL (ref >=50)
LDL Cholesterol (Calc): 66 mg/dL (calc)
Non-HDL Cholesterol (Calc): 86 mg/dL (calc) (ref <130)
Total CHOL/HDL Ratio: 2.3 (calc) (ref <5.0)
Triglycerides: 115 mg/dL (ref <150)

## 2018-09-25 LAB — TSH: TSH: 0.57 mIU/L (ref 0.40–4.50)

## 2018-09-25 MED ORDER — LEVOTHYROXINE SODIUM 88 MCG PO TABS: 88.0000 ug | ORAL_TABLET | Freq: Every day | ORAL | 1 refills | Status: DC

## 2018-10-10 ENCOUNTER — Encounter: Payer: Self-pay | Admitting: Obstetrics and Gynecology

## 2018-10-10 ENCOUNTER — Ambulatory Visit: Payer: Medicare HMO | Admitting: Obstetrics and Gynecology

## 2018-10-10 ENCOUNTER — Other Ambulatory Visit: Payer: Self-pay

## 2018-10-10 VITALS — BP 122/74 | Ht 63.0 in | Wt 154.0 lb

## 2018-10-10 DIAGNOSIS — N9089 Other specified noninflammatory disorders of vulva and perineum: Secondary | ICD-10-CM | POA: Diagnosis not present

## 2018-10-10 NOTE — Progress Notes (Signed)
Obstetrics & Gynecology Office Visit   Chief Complaint  Patient presents with  . Vaginal Itching  Referral from Raelyn Ensign, [place], from [facility] for vaginal itching and a bump  History of Present Illness: 73 y.o. G76P1001 female who presents in referral from Raelyn Ensign, [place], from [facility] for the above.  For a few years, maybe five, she has had vaginal itching. It is located on outside skin of the vagina.  There are three places on the left side and one the right side that itch. She does not believe the skin looks any different, but admits she can't see well.  She was given some sort of cream to help, but it did not really help. She has tried other topicals (Neosporin, etc) that helped just a little.  Nothing seems to make the itching worse apart from when the area is wet.  She does use soap when she washes.  She uses [employer].  She denies any other symptoms. She is not sexually active.   She has recently noticed a bump.  On the left side, there is on close to the top and one in the middle.  There is another one closer down toward the rectum.   She went through menopause at about age 69 years.  She has had no vaginal bleeding.  Her last pap smear was several years ago. It was normal.  In the very distant past, she had an abnormal one.  This was about 40 years ago.  She has regular colonoscopies. She believes her last one was about 3 years ago.  Her last mammogram was one year ago and it was normal.    Past Medical History:  Diagnosis Date  . Allergy   . Diverticulosis   . GERD (gastroesophageal reflux disease)   . Hiatal hernia   . Hypertension   . Migraine with aura    states she doesn't have a headache, just the aura  . Thyroid disease     Past Surgical History:  Procedure Laterality Date  . CHOLECYSTECTOMY    . COLONOSCOPY  06/02/2014   cleared for 10 yrs- Dr Rayann Heman  . COLONOSCOPY N/A 04/07/2017   Procedure: COLONOSCOPY;  Surgeon: Lin Landsman, MD;  Location: [facility] ENDOSCOPY;  Service: Gastroenterology;  Laterality: N/A;  . ESOPHAGEAL DILATION    . ESOPHAGOGASTRODUODENOSCOPY N/A 04/07/2017   Procedure: ESOPHAGOGASTRODUODENOSCOPY (EGD);  Surgeon: Lin Landsman, MD;  Location: [facility] ENDOSCOPY;  Service: Gastroenterology;  Laterality: N/A;  . TONSILLECTOMY    . UPPER GI ENDOSCOPY  06/02/2014    Gynecologic History: No LMP recorded. Patient is postmenopausal.  Obstetric History: G1P1001  Family History  Problem Relation Age of Onset  . Kidney disease Mother   . Cancer Mother        brain  . Heart disease Father   . Heart attack Father   . Lupus Sister   . Diabetes Sister   . Thyroid disease Sister   . Rheum arthritis Sister   . Diabetes Sister   . Multiple myeloma Sister   . Thyroid disease Sister   . Rheum arthritis Sister   . Breast cancer Neg Hx     Social History   Socioeconomic History  . Marital status: Married    Spouse name: Doren Custard  . Number of children: 1  . Years of education: some college  . Highest education level: 12th grade  Occupational History  . Occupation: Retired  Scientific laboratory technician  . Financial resource strain:  Not hard at all  . Food insecurity:    Worry: Never true    Inability: Never true  . Transportation needs:    Medical: No    Non-medical: No  Tobacco Use  . Smoking status: Never Smoker  . Smokeless tobacco: Never Used  . Tobacco comment: smoking cessation materials not required  Substance and Sexual Activity  . Alcohol use: No    Alcohol/week: 0.0 standard drinks  . Drug use: No  . Sexual activity: Never    Birth control/protection: Post-menopausal  Lifestyle  . Physical activity:    Days per week: 0 days    Minutes per session: 0 min  . Stress: Not at all  Relationships  . Social connections:    Talks on phone: Patient refused    Gets together: Patient refused    Attends religious service: Patient refused    Active member of club or organization: Patient  refused    Attends meetings of clubs or organizations: Patient refused    Relationship status: Married  . Intimate partner violence:    Fear of current or ex partner: No    Emotionally abused: No    Physically abused: No    Forced sexual activity: No  Other Topics Concern  . Not on file  Social History Narrative  . Not on file    Allergies  Allergen Reactions  . Codeine Nausea Only and Nausea And Vomiting    Prior to Admission medications   Medication Sig Start Date End Date Taking? Authorizing Provider  aspirin EC 81 MG tablet Take 81 mg by mouth daily.    [provider]  cetirizine (ZYRTEC) 10 MG tablet Take 10 mg by mouth daily. otc    [provider]  Cholecalciferol (VITAMIN D-3) 1000 units CAPS Take 1 capsule by mouth daily.    [provider]  fluticasone (FLONASE) 50 MCG/ACT nasal spray Place 1 spray into both nostrils daily. 01/22/18   Juline Patch, MD  levothyroxine (SYNTHROID) 88 MCG tablet Take 1 tablet (88 mcg total) by mouth daily. 09/25/18   Hubbard Hartshorn, FNP  lovastatin (MEVACOR) 20 MG tablet Take 1 tablet (20 mg total) by mouth daily. 09/24/18   Hubbard Hartshorn, FNP  montelukast (SINGULAIR) 10 MG tablet Take 1 tablet (10 mg total) by mouth daily. 09/24/18   Hubbard Hartshorn, FNP  vitamin B-12 (CYANOCOBALAMIN) 1000 MCG tablet Take 1,000 mcg by mouth daily.    [provider]    Review of Systems  Constitutional: Negative.   HENT: Negative.   Eyes: Negative.   Respiratory: Negative.   Cardiovascular: Negative.   Gastrointestinal: Negative.   Genitourinary: Negative.        See HPI  Musculoskeletal: Negative.   Skin: Negative.   Neurological: Negative.   Psychiatric/Behavioral: Negative.      Physical Exam BP 122/74   Ht '5\' 3"'$  (1.6 m)   Wt 154 lb (69.9 kg)   BMI 27.28 kg/m  No LMP recorded. Patient is postmenopausal. Physical Exam Constitutional:      General: She is not in acute distress.    Appearance: Normal  appearance. She is well-developed.  Genitourinary:     Pelvic exam was performed with patient in the lithotomy position.     Inguinal canal, urethra, bladder, uterus, right adnexa and left adnexa normal.     No posterior fourchette tenderness, injury or lesion present.        Vaginal atrophic mucosa and prolapse present.  No cervical friability, lesion, bleeding or polyp.  HENT:     Head: Normocephalic and atraumatic.  Eyes:     General: No scleral icterus.    Conjunctiva/sclera: Conjunctivae normal.  Neck:     Musculoskeletal: Normal range of motion and neck supple.  Cardiovascular:     Rate and Rhythm: Normal rate and regular rhythm.  Pulmonary:     Effort: Pulmonary effort is normal. No respiratory distress.     Breath sounds: Normal breath sounds. No wheezing or rales.  Abdominal:     General: Bowel sounds are normal. There is no distension.     Palpations: Abdomen is soft. There is no mass.     Tenderness: There is no abdominal tenderness. There is no guarding or rebound.  Musculoskeletal: Normal range of motion.  Neurological:     General: No focal deficit present.     Mental Status: She is alert and oriented to person, place, and time.     Cranial Nerves: No cranial nerve deficit.  Skin:    General: Skin is warm and dry.     Findings: No erythema.  Psychiatric:        Mood and Affect: Mood normal.        Behavior: Behavior normal.        Judgment: Judgment normal.    Female chaperone present for pelvic and breast  portions of the physical exam  Assessment: 73 y.o. G53P1001 female here for  1. Vulvar lesion      Plan: Problem List Items Addressed This Visit    None    Visit Diagnoses    Vulvar lesion    -  Primary     Discussed findings with patient.  Findings are consistent with allergic dermatitis of the vulva versus a vulvar dermatosis associated with menopause.  Exam findings are not consistent with neoplasia.  However, we discussed that I cannot  completely rule this out.  The plan of treatment is as follows:  1) 2 weeks conservative treatment using topical emollient and avoid use of soap and detergent., if good response, then no need to follow up.  2) If she gets no response to this treatment, will consider topical steroid emmollient treatment for 6 weeks with follow-up in 2 to 3 months. 3) If no response in two weeks with topical steroids, then return to office for biopsy.  30 minutes spent in face to face discussion with > 50% spent in counseling,management, and coordination of care of her vulvar lesion.   Prentice Docker, MD 10/10/2018 2:41 PM     CC: Hubbard Hartshorn, [place], [place] [id number]

## 2018-10-19 ENCOUNTER — Ambulatory Visit (INDEPENDENT_AMBULATORY_CARE_PROVIDER_SITE_OTHER): Payer: Medicare HMO | Admitting: Family Medicine

## 2018-10-19 ENCOUNTER — Telehealth: Payer: Self-pay | Admitting: Family Medicine

## 2018-10-19 ENCOUNTER — Encounter: Payer: Self-pay | Admitting: Family Medicine

## 2018-10-19 ENCOUNTER — Other Ambulatory Visit: Payer: Self-pay

## 2018-10-19 VITALS — BP 153/73 | HR 54

## 2018-10-19 DIAGNOSIS — R21 Rash and other nonspecific skin eruption: Secondary | ICD-10-CM

## 2018-10-19 DIAGNOSIS — L03312 Cellulitis of back [any part except buttock]: Secondary | ICD-10-CM | POA: Diagnosis not present

## 2018-10-19 MED ORDER — DOXYCYCLINE HYCLATE 100 MG PO TABS: 100.0000 mg | ORAL_TABLET | Freq: Two times a day (BID) | ORAL | 0 refills | Status: AC

## 2018-10-19 MED ORDER — PREDNISONE 10 MG PO TABS: ORAL_TABLET | ORAL | 0 refills | Status: DC

## 2018-10-19 MED ORDER — PREDNISONE 10 MG PO TABS: ORAL_TABLET | ORAL | 0 refills | Status: AC

## 2018-10-19 NOTE — Progress Notes (Signed)
Name: Pamela Hendrix   MRN: [id number]    DOB: [birth year]   Date:10/19/2018       Progress Note  Subjective  Chief Complaint  Chief Complaint  Patient presents with  . welps    on back with itxging onset 1 week.    I connected with  [name]  on 10/19/18 at 11:40 AM EDT by a video enabled telemedicine application and verified that I am speaking with the correct person using two identifiers.  I discussed the limitations of evaluation and management by telemedicine and the availability of in person appointments. The patient expressed understanding and agreed to proceed. Staff also discussed with the patient that there may be a patient responsible charge related to this service. Patient Location: Home Provider Location: Office Additional Individuals present: None  HPI  Pt presents with concern for multiple "welts" on her mid-back for about a week.  Only one welt is itchy, mostly the welts are a bit painful.  The areas have not worsened, but have not improved either.  No new welts.  She notes the pain is wrapping around to her armpit and left breast pain.  She denies chest pain or shortness of breath - did advise my caridiac exam is quite limited due to virtual visit.  Her BP was a bit elevated today, but she declined medication. No fevers/chills, no other contacts with similar rash.  She is UTD on shingrix shot.   Patient Active Problem List   Diagnosis Date Noted  . PFO (patent foramen ovale) 07/31/2018  . Abnormal ECG 07/13/2018  . Bradycardia 07/13/2018  . Primary osteoarthritis of left knee 11/20/2017  . History of GI diverticular bleed 05/18/2017  . Vitamin B12 deficiency anemia 05/18/2017  . Chronic allergic rhinitis 06/28/2016  . Mixed hyperlipidemia 06/28/2016  . Hiatal hernia 08/28/2014  . Hypothyroid 08/28/2014  . Headache 10/25/2013  . Hypertension 10/25/2013  . Numbness and tingling 10/25/2013  . Obesity 10/25/2013  . Visual disturbance  10/25/2013  . Thyroid nodule 09/19/2013  . Onychomycosis due to dermatophyte 08/21/2013    Social History   Tobacco Use  . Smoking status: Never Smoker  . Smokeless tobacco: Never Used  . Tobacco comment: smoking cessation materials not required  Substance Use Topics  . Alcohol use: No    Alcohol/week: 0.0 standard drinks     Current Outpatient Medications:  .  aspirin EC 81 MG tablet, Take 81 mg by mouth daily., Disp: , Rfl:  .  cetirizine (ZYRTEC) 10 MG tablet, Take 10 mg by mouth daily. otc, Disp: , Rfl:  .  Cholecalciferol (VITAMIN D-3) 1000 units CAPS, Take 1 capsule by mouth daily., Disp: , Rfl:  .  fluticasone (FLONASE) 50 MCG/ACT nasal spray, Place 1 spray into both nostrils daily., Disp: 16 g, Rfl: 11 .  levothyroxine (SYNTHROID) 88 MCG tablet, Take 1 tablet (88 mcg total) by mouth daily., Disp: 90 tablet, Rfl: 1 .  lovastatin (MEVACOR) 20 MG tablet, Take 1 tablet (20 mg total) by mouth daily., Disp: 90 tablet, Rfl: 3 .  montelukast (SINGULAIR) 10 MG tablet, Take 1 tablet (10 mg total) by mouth daily., Disp: 90 tablet, Rfl: 3  Allergies  Allergen Reactions  . Codeine Nausea Only and Nausea And Vomiting    I personally reviewed active problem list, medication list, allergies, lab results with the patient/caregiver today.  ROS  Ten systems reviewed and is negative except as mentioned in HPI.  Objective  Virtual encounter, vitals not obtained.  There is no height or weight on file to calculate BMI.  Nursing Note and Vital Signs reviewed.  Physical Exam  Constitutional: Patient appears well-developed and well-nourished. No distress.  HENT: Head: Normocephalic and atraumatic.  Neck: Normal range of motion. Pulmonary/Chest: Effort normal. No respiratory distress. Speaking in complete sentences Neurological: Pt is alert and oriented to person, place, and time. Coordination, speech and gait are normal.  Psychiatric: Patient has a normal mood and affect. behavior  is normal. Judgment and thought content normal. Skin: There are 6 total approx 2 inch raised erythematous round lesions on the LEFT mid back that each have small blisters on top of the erythematous area.   No results found for this or any previous visit (from the past 72 hour(s)).  Assessment & Plan  1. Cellulitis of back except buttock - doxycycline (VIBRA-TABS) 100 MG tablet; Take 1 tablet (100 mg total) by mouth 2 (two) times daily for 7 days.  Dispense: 14 tablet; Refill: 0 - Lesions are possibly consistent with shingles, but does not appear as a typical presentation, and the blistering has not crusted even after 7 days.  I will avoid topical application due to blisters.  Concerned for secondary infection due to the very erythematous appearance.  2. Rash and nonspecific skin eruption - predniSONE (DELTASONE) 10 MG tablet; Take 5 tablets (50 mg total) by mouth daily with breakfast for 1 day, THEN 4 tablets (40 mg total) daily with breakfast for 1 day, THEN 3 tablets (30 mg total) daily with breakfast for 1 day, THEN 2 tablets (20 mg total) daily with breakfast for 1 day, THEN 1 tablet (10 mg total) daily with breakfast for 1 day.  Dispense: 15 tablet; Refill: 0  - Did discuss in detail the limited nature of my examination, especially cardiac as she does describe left breast pain.  She verbalizes understanding of this, and if she develops worsening pain she will present to UC or ER for evaluation.  -Red flags and when to present for emergency care or RTC including fever >101.51F, chest pain, shortness of breath, new/worsening/un-resolving symptoms, reviewed with patient at time of visit. Follow up and care instructions discussed and provided in AVS. - I discussed the assessment and treatment plan with the patient. The patient was provided an opportunity to ask questions and all were answered. The patient agreed with the plan and demonstrated an understanding of the instructions.  I provided 21  minutes of non-face-to-face time during this encounter.  Hubbard Hartshorn, FNP

## 2018-10-19 NOTE — Telephone Encounter (Signed)
Reordered, and faxed

## 2018-10-19 NOTE — Patient Instructions (Signed)
If not improving in 3-5 days, call our office back

## 2018-10-19 NOTE — Telephone Encounter (Signed)
Copied from [place] [id number]. Topic: Quick Communication - Rx Refill/Question >> Oct 19, 2018  1:54 PM Margot Ables wrote: Medication: predniSONE (DELTASONE) 10 MG tablet  - pharmacy did not receive - please resend electronic RX  your pharmacy.

## 2018-10-26 ENCOUNTER — Telehealth: Payer: Self-pay | Admitting: Emergency Medicine

## 2018-10-26 DIAGNOSIS — R21 Rash and other nonspecific skin eruption: Secondary | ICD-10-CM

## 2018-10-26 NOTE — Telephone Encounter (Signed)
Copied from [place] [id number]. Topic: General - Other >> Oct 26, 2018 10:56 AM Percell Belt A wrote: Reason for CRM: pt called in and stated that she has finished both meds, the prednisone and the doxycycline (VIBRA-TABS) 100 MG tablet [[id number]] .  She stated that her back is still tender and sore and now she has a spot on her breast that developed Tuesday .  She would like to know what she needs to do   Please advise  Best number  [phone]

## 2018-10-26 NOTE — Telephone Encounter (Signed)
Please schedule

## 2018-10-26 NOTE — Telephone Encounter (Signed)
I am placing referral to dermatology - this is somewhat urgent - please see if they can get her in Monday or Tuesday of next week. Monitor over the weekend, and if worsening, she needs to go to [facility] for evaluation.

## 2018-10-26 NOTE — Telephone Encounter (Signed)
Referral has been sent to [facility] for review.

## 2018-11-08 DIAGNOSIS — L821 Other seborrheic keratosis: Secondary | ICD-10-CM | POA: Diagnosis not present

## 2018-11-08 DIAGNOSIS — D225 Melanocytic nevi of trunk: Secondary | ICD-10-CM | POA: Diagnosis not present

## 2018-11-08 DIAGNOSIS — L309 Dermatitis, unspecified: Secondary | ICD-10-CM | POA: Diagnosis not present

## 2018-11-08 DIAGNOSIS — B0229 Other postherpetic nervous system involvement: Secondary | ICD-10-CM | POA: Diagnosis not present

## 2018-11-08 DIAGNOSIS — D2262 Melanocytic nevi of left upper limb, including shoulder: Secondary | ICD-10-CM | POA: Diagnosis not present

## 2018-11-08 DIAGNOSIS — D2261 Melanocytic nevi of right upper limb, including shoulder: Secondary | ICD-10-CM | POA: Diagnosis not present

## 2018-11-08 DIAGNOSIS — D692 Other nonthrombocytopenic purpura: Secondary | ICD-10-CM | POA: Diagnosis not present

## 2018-12-04 DIAGNOSIS — N183 Chronic kidney disease, stage 3 unspecified: Secondary | ICD-10-CM | POA: Insufficient documentation

## 2018-12-04 DIAGNOSIS — R001 Bradycardia, unspecified: Secondary | ICD-10-CM | POA: Diagnosis not present

## 2018-12-04 DIAGNOSIS — E782 Mixed hyperlipidemia: Secondary | ICD-10-CM | POA: Diagnosis not present

## 2018-12-04 DIAGNOSIS — I10 Essential (primary) hypertension: Secondary | ICD-10-CM | POA: Diagnosis not present

## 2018-12-18 DIAGNOSIS — L249 Irritant contact dermatitis, unspecified cause: Secondary | ICD-10-CM | POA: Diagnosis not present

## 2018-12-18 DIAGNOSIS — B0229 Other postherpetic nervous system involvement: Secondary | ICD-10-CM | POA: Diagnosis not present

## 2018-12-18 DIAGNOSIS — L84 Corns and callosities: Secondary | ICD-10-CM | POA: Diagnosis not present

## 2018-12-31 DIAGNOSIS — R69 Illness, unspecified: Secondary | ICD-10-CM | POA: Diagnosis not present

## 2019-01-01 ENCOUNTER — Other Ambulatory Visit: Payer: Self-pay

## 2019-01-01 ENCOUNTER — Ambulatory Visit (INDEPENDENT_AMBULATORY_CARE_PROVIDER_SITE_OTHER): Payer: Medicare HMO | Admitting: Family Medicine

## 2019-01-01 ENCOUNTER — Encounter: Payer: Self-pay | Admitting: Family Medicine

## 2019-01-01 VITALS — BP 151/73 | HR 62 | Wt 152.6 lb

## 2019-01-01 DIAGNOSIS — E782 Mixed hyperlipidemia: Secondary | ICD-10-CM | POA: Diagnosis not present

## 2019-01-01 DIAGNOSIS — J309 Allergic rhinitis, unspecified: Secondary | ICD-10-CM | POA: Diagnosis not present

## 2019-01-01 DIAGNOSIS — R944 Abnormal results of kidney function studies: Secondary | ICD-10-CM

## 2019-01-01 DIAGNOSIS — Z8719 Personal history of other diseases of the digestive system: Secondary | ICD-10-CM | POA: Diagnosis not present

## 2019-01-01 DIAGNOSIS — E039 Hypothyroidism, unspecified: Secondary | ICD-10-CM | POA: Diagnosis not present

## 2019-01-01 DIAGNOSIS — I10 Essential (primary) hypertension: Secondary | ICD-10-CM

## 2019-01-01 DIAGNOSIS — G43109 Migraine with aura, not intractable, without status migrainosus: Secondary | ICD-10-CM

## 2019-01-01 DIAGNOSIS — R001 Bradycardia, unspecified: Secondary | ICD-10-CM | POA: Diagnosis not present

## 2019-01-01 DIAGNOSIS — R9431 Abnormal electrocardiogram [ECG] [EKG]: Secondary | ICD-10-CM

## 2019-01-01 DIAGNOSIS — D649 Anemia, unspecified: Secondary | ICD-10-CM

## 2019-01-01 DIAGNOSIS — Z1231 Encounter for screening mammogram for malignant neoplasm of breast: Secondary | ICD-10-CM

## 2019-01-01 NOTE — Progress Notes (Signed)
Name: Pamela Hendrix   MRN: [id number]    DOB: [birth year]   Date:01/01/2019       Progress Note  Subjective  Chief Complaint  Chief Complaint  Patient presents with   Follow-up    3 Month Follow Up    I connected with  [name]  on 01/01/19 at 12:40 PM EDT by a video enabled telemedicine application and verified that I am speaking with the correct person using two identifiers.  I discussed the limitations of evaluation and management by telemedicine and the availability of in person appointments. The patient expressed understanding and agreed to proceed. Staff also discussed with the patient that there may be a patient responsible charge related to this service. Patient Location: Home Provider Location: Home Additional Individuals present: None  HPI  Hypothyroidism: Has had for over 50 years; due for TSH check. +Cold and Heat intolerance, has intermittent diarrhea/constipation (has hx diverticulosis and is followed by GI).  Denies palpitations, heat intolerance.  Does have brittle nails and dry skin. Stable and unchanged, will check labs however due to ongoing heat/cold intolerance.  Hx Diverticulitis/IBS/hx anemia: was put put on Amitza for 4 months after have GI bleed and requiring blood transfusion.  She has stopped the Amitiza - it did help, but it was very expensive.  She is due for follow up with Dr. Marius Ditch and will call to schedule (declines referral today though one is offered).  She denies any rectal bleeding.  Does have occasional abdominal pain (mild, and is baseline for her). She is taking miralax daily which helps with her abdominal pain by producing BM.  Abnormal EKG/Hyperlipidemia: On lovastatin, lipids in May were at goal.  Denies chest pain, occasional shortness of breath with exertion.  Does see Dr. Nehemiah Massed for hx bradycardia and abnormal EKG with recent visit about a month ago. Taking ASA daily. Had recent echo in 07/31/2018 did show some  abnormalities, results are available and reviewed in care everywhere through [facility].  Abnormal kidney function: Did have x1 on last labs, due for recheck today to determine stability and confirm level of CKD; on ARB for about 2 weeks now and doing well with this.  HTN: Seeing Dr. Nehemiah Massed - was put on lisinopril, had cough, so was changed to losartan.  BP is elevated at home today but she is under some increased stress today; has otherwise been well controlled.  She denies chest pain; does get some mild BLE edema - non-pitting, and does resolve overnight with elevated.   AR: Taking Zyrtec, Flonase, and Singulair.  She is aware of new black box warning for singulair, and is continuing with the medication.  Symptoms include nasal congestion and cough.  Typically worse in the spring, but also has symptoms year round - she has been doing well with this combination, stable and unchanged.   ?Hx TIA/Migraine aura: Seeing Dr. Manuella Ghazi, she was told the sx's were likely migraine aura and not TIA's.  She had extensive work-up- 24-hour and 3-day EEG, has history of complex migraine as a teenager, and again in 2015.  She has had multiple episodes since of tunnel vision, dizziness, weakness, confusion, and sweating.  Most recent migraine was about 2 months ago - she has only had 2 episodes since February 2020.  Normal MRI brain 03/17/2016.  She is taking magnesium supplements and will continue follow-up with neurology.  Patient Active Problem List   Diagnosis Date Noted   PFO (patent foramen ovale) 07/31/2018   Abnormal  ECG 07/13/2018   Bradycardia 07/13/2018   Primary osteoarthritis of left knee 11/20/2017   History of GI diverticular bleed 05/18/2017   Vitamin B12 deficiency anemia 05/18/2017   Chronic allergic rhinitis 06/28/2016   Mixed hyperlipidemia 06/28/2016   Hiatal hernia 08/28/2014   Hypothyroid 08/28/2014   Headache 10/25/2013   Hypertension 10/25/2013   Numbness and tingling  10/25/2013   Obesity 10/25/2013   Visual disturbance 10/25/2013   Thyroid nodule 09/19/2013   Onychomycosis due to dermatophyte 08/21/2013    Past Surgical History:  Procedure Laterality Date   CHOLECYSTECTOMY     COLONOSCOPY  06/02/2014   cleared for 10 yrs- Dr Rayann Heman   COLONOSCOPY N/A 04/07/2017   Procedure: COLONOSCOPY;  Surgeon: Lin Landsman, MD;  Location: [facility] ENDOSCOPY;  Service: Gastroenterology;  Laterality: N/A;   ESOPHAGEAL DILATION     ESOPHAGOGASTRODUODENOSCOPY N/A 04/07/2017   Procedure: ESOPHAGOGASTRODUODENOSCOPY (EGD);  Surgeon: Lin Landsman, MD;  Location: [facility] ENDOSCOPY;  Service: Gastroenterology;  Laterality: N/A;   TONSILLECTOMY     UPPER GI ENDOSCOPY  06/02/2014    Family History  Problem Relation Age of Onset   Kidney disease Mother    Cancer Mother        brain   Heart disease Father    Heart attack Father    Lupus Sister    Diabetes Sister    Thyroid disease Sister    Rheum arthritis Sister    Diabetes Sister    Multiple myeloma Sister    Thyroid disease Sister    Rheum arthritis Sister    Breast cancer Neg Hx     Social History   Socioeconomic History   Marital status: Married    Spouse name: Doren Custard   Number of children: 1   Years of education: some college   Highest education level: 12th grade  Occupational History   Occupation: Retired  Scientist, product/process development strain: Not hard at [employer]    Worry: Never true    Inability: Never true   Transportation needs    Medical: No    Non-medical: No  Tobacco Use   Smoking status: Never Smoker   Smokeless tobacco: Never Used   Tobacco comment: smoking cessation materials not required  Substance and Sexual Activity   Alcohol use: No    Alcohol/week: 0.0 standard drinks   Drug use: No   Sexual activity: Never    Birth control/protection: Post-menopausal  Lifestyle   Physical activity    Days per week: 0 days      Minutes per session: 0 min   Stress: Not at all  Relationships   Social connections    Talks on phone: Patient refused    Gets together: Patient refused    Attends religious service: Patient refused    Active member of club or organization: Patient refused    Attends meetings of clubs or organizations: Patient refused    Relationship status: Married   Intimate partner violence    Fear of current or ex partner: No    Emotionally abused: No    Physically abused: No    Forced sexual activity: No  Other Topics Concern   Not on file  Social History Narrative   Not on file     Current Outpatient Medications:    aspirin EC 81 MG tablet, Take 81 mg by mouth daily., Disp: , Rfl:    cetirizine (ZYRTEC) 10 MG tablet, Take 10 mg by mouth daily. otc, Disp: ,  Rfl:    Cholecalciferol (VITAMIN D-3) 1000 units CAPS, Take 1 capsule by mouth daily., Disp: , Rfl:    fluticasone (FLONASE) 50 MCG/ACT nasal spray, Place 1 spray into both nostrils daily., Disp: 16 g, Rfl: 11   levothyroxine (SYNTHROID) 88 MCG tablet, Take 1 tablet (88 mcg total) by mouth daily., Disp: 90 tablet, Rfl: 1   lovastatin (MEVACOR) 20 MG tablet, Take 1 tablet (20 mg total) by mouth daily., Disp: 90 tablet, Rfl: 3   montelukast (SINGULAIR) 10 MG tablet, Take 1 tablet (10 mg total) by mouth daily., Disp: 90 tablet, Rfl: 3  Allergies  Allergen Reactions   Codeine Nausea Only and Nausea And Vomiting    I personally reviewed active problem list, medication list, allergies, health maintenance, notes from last encounter, lab results with the patient/caregiver today.   ROS  Constitutional: Negative for fever or weight change.  Respiratory: Negative for cough and shortness of breath.   Cardiovascular: Negative for chest pain or palpitations.  Gastrointestinal: Negative for abdominal pain, no bowel changes.  Musculoskeletal: Negative for gait problem or joint swelling.  Skin: Negative for rash.  Neurological:  Negative for dizziness or headache.  No other specific complaints in a complete review of systems (except as listed in HPI above).  Objective  Virtual encounter, vitals not obtained.  Body mass index is 27.03 kg/m.  Physical Exam  Constitutional: Patient appears well-developed and well-nourished. No distress.  HENT: Head: Normocephalic and atraumatic.  Neck: Normal range of motion. Pulmonary/Chest: Effort normal. No respiratory distress. Speaking in complete sentences Neurological: Pt is alert and oriented to person, place, and time. Coordination, speech are normal.  Psychiatric: Patient has a normal mood and affect. behavior is normal. Judgment and thought content normal.  No results found for this or any previous visit (from the past 72 hour(s)).  PHQ2/9: Depression screen [facility] 2/9 01/01/2019 10/19/2018 09/24/2018 01/22/2018 09/04/2017  Decreased Interest 1 0 1 0 0  Down, Depressed, Hopeless 1 0 0 1 0  PHQ - 2 Score 2 0 1 1 0  Altered sleeping 0 0 0 0 0  Tired, decreased energy 1 0 1 0 0  Change in appetite 1 0 1 1 0  Feeling bad or failure about yourself  0 0 0 1 0  Trouble concentrating 0 0 0 0 0  Moving slowly or fidgety/restless 0 0 0 0 0  Suicidal thoughts 0 0 0 0 0  PHQ-9 Score 4 0 3 3 0  Difficult doing work/chores Not difficult at all Not difficult at all Not difficult at all Not difficult at all Not difficult at all   PHQ-2/9 Result is slightly positive - we will check some labs today to see if thyroid is affecting mood, overall feels well.    Fall Risk: Fall Risk  01/01/2019 10/19/2018 09/24/2018 01/22/2018 09/04/2017  Falls in the past year? 0 0 0 No Yes  Comment - - - - carport fell causing pt to fall  Number falls in past yr: 0 0 0 - 1  Injury with Fall? 0 0 0 - No  Risk for fall due to : - - Other (Comment) - History of fall(s);Impaired vision  Risk for fall due to: Comment - - - - tripped over vaccum cleaner; wears eyeglasses  Follow up - - Falls evaluation completed -  Falls evaluation completed;Education provided;Falls prevention discussed    Assessment & Plan  1. Hypothyroidism, unspecified type - continue current medication at this time; come in for labs  in the next 1-2 weeks. - TSH  2. History of GI diverticular bleed - CBC per orders; needs to schedule follow up with Dr. Marius Ditch  3. Mild anemia - CBC with Differential/Platelet  4. Abnormal ECG - Seeing Cardiology  5. Bradycardia - Seeing Cardiology  6. Breast cancer screening by mammogram - MM 3D SCREEN BREAST BILATERAL; Future  7. Decreased GFR - Recheck today; taking ARB, if still decreased will label CKD diagnosis. - COMPLETE METABOLIC PANEL WITH GFR  8. Essential hypertension - Taking Losartan; has been normal at every check except today when she received some difficult news from her sister; will monitor and notify us or Dr. Nehemiah Massed if persistently elevated. - COMPLETE METABOLIC PANEL WITH GFR  9. Mixed hyperlipidemia - Taking statin; does note some occasional muscle aches, advised may want to try taking 3-4 days a week for 2 weeks to see if this helps the aches.  10. Chronic allergic rhinitis - continue current medicaitons  11. Migraine aura without headache - still having the occasional episode, though not worsening in intensity or frequency.  Declines to return to Dr. Manuella Ghazi today, but will let us know if things worsen.  I discussed the assessment and treatment plan with the patient. The patient was provided an opportunity to ask questions and all were answered. The patient agreed with the plan and demonstrated an understanding of the instructions.  The patient was advised to call back or seek an in-person evaluation if the symptoms worsen or if the condition fails to improve as anticipated.  I provided 28 minutes of non-face-to-face time during this encounter.

## 2019-01-03 DIAGNOSIS — R69 Illness, unspecified: Secondary | ICD-10-CM | POA: Diagnosis not present

## 2019-01-16 DIAGNOSIS — D649 Anemia, unspecified: Secondary | ICD-10-CM | POA: Diagnosis not present

## 2019-01-16 DIAGNOSIS — R944 Abnormal results of kidney function studies: Secondary | ICD-10-CM | POA: Diagnosis not present

## 2019-01-16 DIAGNOSIS — E039 Hypothyroidism, unspecified: Secondary | ICD-10-CM | POA: Diagnosis not present

## 2019-01-16 DIAGNOSIS — I10 Essential (primary) hypertension: Secondary | ICD-10-CM | POA: Diagnosis not present

## 2019-01-17 LAB — CBC WITH DIFFERENTIAL/PLATELET
Absolute Monocytes: 428 cells/uL (ref 200–950)
Basophils Absolute: 41 cells/uL (ref 0–200)
Basophils Relative: 0.6 %
Eosinophils Absolute: 41 cells/uL (ref 15–500)
Eosinophils Relative: 0.6 %
HCT: 34.7 % — ABNORMAL LOW (ref 35.0–45.0)
Hemoglobin: 11.5 g/dL — ABNORMAL LOW (ref 11.7–15.5)
Lymphs Abs: 1102 cells/uL (ref 850–3900)
MCH: 32.6 pg (ref 27.0–33.0)
MCHC: 33.1 g/dL (ref 32.0–36.0)
MCV: 98.3 fL (ref 80.0–100.0)
MPV: 12.7 fL — ABNORMAL HIGH (ref 7.5–12.5)
Monocytes Relative: 6.3 %
Neutro Abs: 5188 cells/uL (ref 1500–7800)
Neutrophils Relative %: 76.3 %
Platelets: 137 10*3/uL — ABNORMAL LOW (ref 140–400)
RBC: 3.53 10*6/uL — ABNORMAL LOW (ref 3.80–5.10)
RDW: 12.9 % (ref 11.0–15.0)
Total Lymphocyte: 16.2 %
WBC: 6.8 10*3/uL (ref 3.8–10.8)

## 2019-01-17 LAB — TSH: TSH: 0.77 mIU/L (ref 0.40–4.50)

## 2019-01-17 LAB — COMPLETE METABOLIC PANEL WITH GFR
AG Ratio: 1.4 (calc) (ref 1.0–2.5)
ALT: 8 U/L (ref 6–29)
AST: 13 U/L (ref 10–35)
Albumin: 4 g/dL (ref 3.6–5.1)
Alkaline phosphatase (APISO): 54 U/L (ref 37–153)
BUN/Creatinine Ratio: 13 (calc) (ref 6–22)
BUN: 14 mg/dL (ref 7–25)
CO2: 29 mmol/L (ref 20–32)
Calcium: 9.2 mg/dL (ref 8.6–10.4)
Chloride: 106 mmol/L (ref 98–110)
Creat: 1.04 mg/dL — ABNORMAL HIGH (ref 0.60–0.93)
GFR, Est African American: 62 mL/min/{1.73_m2} (ref >=60)
GFR, Est Non African American: 53 mL/min/{1.73_m2} — ABNORMAL LOW (ref >=60)
Globulin: 2.8 g/dL (calc) (ref 1.9–3.7)
Glucose, Bld: 91 mg/dL (ref 65–99)
Potassium: 4.4 mmol/L (ref 3.5–5.3)
Sodium: 140 mmol/L (ref 135–146)
Total Bilirubin: 1 mg/dL (ref 0.2–1.2)
Total Protein: 6.8 g/dL (ref 6.1–8.1)

## 2019-01-30 ENCOUNTER — Ambulatory Visit
Admission: RE | Admit: 2019-01-30 | Discharge: 2019-01-30 | Disposition: A | Discharge status: Home / Self Care | Payer: Medicare HMO | Source: Ambulatory Visit | Attending: Family Medicine | Admitting: Family Medicine

## 2019-01-30 ENCOUNTER — Other Ambulatory Visit: Payer: Self-pay

## 2019-01-30 DIAGNOSIS — Z1231 Encounter for screening mammogram for malignant neoplasm of breast: Secondary | ICD-10-CM | POA: Insufficient documentation

## 2019-02-16 DIAGNOSIS — R69 Illness, unspecified: Secondary | ICD-10-CM | POA: Diagnosis not present

## 2019-02-19 ENCOUNTER — Ambulatory Visit (INDEPENDENT_AMBULATORY_CARE_PROVIDER_SITE_OTHER): Payer: Medicare HMO

## 2019-02-19 ENCOUNTER — Other Ambulatory Visit: Payer: Self-pay

## 2019-02-19 VITALS — BP 138/69 | HR 52 | Temp 98.2°F | Ht 63.0 in | Wt 157.0 lb

## 2019-02-19 DIAGNOSIS — Z0000 Encounter for general adult medical examination without abnormal findings: Secondary | ICD-10-CM

## 2019-02-19 NOTE — Patient Instructions (Signed)
Pamela Hendrix , Thank you for taking time to come for your Medicare Wellness Visit. I appreciate your ongoing commitment to your health goals. Please review the following plan we discussed and let me know if I can assist you in the future.   Screening recommendations/referrals: Colonoscopy: done 04/07/17. Repeat in 2023. Mammogram: done 01/30/19 Bone Density: done 09/24/15 Recommended yearly ophthalmology/optometry visit for glaucoma screening and checkup Recommended yearly dental visit for hygiene and checkup  Vaccinations: Influenza vaccine: done 02/16/19 Pneumococcal vaccine: done 06/19/15 Tdap vaccine: done 10/14/10 Shingles vaccine: Shingrix discussed. Please contact your pharmacy for coverage information.   Advanced directives: Please bring a copy of your health care power of attorney and living will to the office at your convenience.  Conditions/risks identified: Recommend increasing physical activity to at least 3 days per week  Next appointment: Please follow up in one year for your Medicare Annual Wellness visit.     Preventive Care 6 Years and Older, Female Preventive care refers to lifestyle choices and visits with your health care provider that can promote health and wellness. What does preventive care include?  A yearly physical exam. This is also called an annual well check.  Dental exams once or twice a year.  Routine eye exams. Ask your health care provider how often you should have your eyes checked.  Personal lifestyle choices, including:  Daily care of your teeth and gums.  Regular physical activity.  Eating a healthy diet.  Avoiding tobacco and drug use.  Limiting alcohol use.  Practicing safe sex.  Taking low-dose aspirin every day.  Taking vitamin and mineral supplements as recommended by your health care provider. What happens during an annual well check? The services and screenings done by your health care provider during your annual well check  will depend on your age, overall health, lifestyle risk factors, and family history of disease. Counseling  Your health care provider may ask you questions about your:  Alcohol use.  Tobacco use.  Drug use.  Emotional well-being.  Home and relationship well-being.  Sexual activity.  Eating habits.  History of falls.  Memory and ability to understand (cognition).  Work and work Statistician.  Reproductive health. Screening  You may have the following tests or measurements:  Height, weight, and BMI.  Blood pressure.  Lipid and cholesterol levels. These may be checked every 5 years, or more frequently if you are over 56 years old.  Skin check.  Lung cancer screening. You may have this screening every year starting at age 28 if you have a 30-pack-year history of smoking and currently smoke or have quit within the past 15 years.  Fecal occult blood test (FOBT) of the stool. You may have this test every year starting at age 46.  Flexible sigmoidoscopy or colonoscopy. You may have a sigmoidoscopy every 5 years or a colonoscopy every 10 years starting at age 8.  Hepatitis C blood test.  Hepatitis B blood test.  Sexually transmitted disease (STD) testing.  Diabetes screening. This is done by checking your blood sugar (glucose) after you have not eaten for a while (fasting). You may have this done every 1-3 years.  Bone density scan. This is done to screen for osteoporosis. You may have this done starting at age 32.  Mammogram. This may be done every 1-2 years. Talk to your health care provider about how often you should have regular mammograms. Talk with your health care provider about your test results, treatment options, and if necessary, the need  for more tests. Vaccines  Your health care provider may recommend certain vaccines, such as:  Influenza vaccine. This is recommended every year.  Tetanus, diphtheria, and acellular pertussis (Tdap, Td) vaccine. You may  need a Td booster every 10 years.  Zoster vaccine. You may need this after age 74.  Pneumococcal 13-valent conjugate (PCV13) vaccine. One dose is recommended after age 75.  Pneumococcal polysaccharide (PPSV23) vaccine. One dose is recommended after age 35. Talk to your health care provider about which screenings and vaccines you need and how often you need them. This information is not intended to replace advice given to you by your health care provider. Make sure you discuss any questions you have with your health care provider. Document Released: 05/29/2015 Document Revised: 01/20/2016 Document Reviewed: 03/03/2015 Elsevier Interactive Patient Education  2017 [place] Prevention in the Home Falls can cause injuries. They can happen to people of all ages. There are many things you can do to make your home safe and to help prevent falls. What can I do on the outside of my home?  Regularly fix the edges of walkways and driveways and fix any cracks.  Remove anything that might make you trip as you walk through a door, such as a raised step or threshold.  Trim any bushes or trees on the path to your home.  Use bright outdoor lighting.  Clear any walking paths of anything that might make someone trip, such as rocks or tools.  Regularly check to see if handrails are loose or broken. Make sure that both sides of any steps have handrails.  Any raised decks and porches should have guardrails on the edges.  Have any leaves, snow, or ice cleared regularly.  Use sand or salt on walking paths during winter.  Clean up any spills in your garage right away. This includes oil or grease spills. What can I do in the bathroom?  Use night lights.  Install grab bars by the toilet and in the tub and shower. Do not use towel bars as grab bars.  Use non-skid mats or decals in the tub or shower.  If you need to sit down in the shower, use a plastic, non-slip stool.  Keep the floor  dry. Clean up any water that spills on the floor as soon as it happens.  Remove soap buildup in the tub or shower regularly.  Attach bath mats securely with double-sided non-slip rug tape.  Do not have throw rugs and other things on the floor that can make you trip. What can I do in the bedroom?  Use night lights.  Make sure that you have a light by your bed that is easy to reach.  Do not use any sheets or blankets that are too big for your bed. They should not hang down onto the floor.  Have a firm chair that has side arms. You can use this for support while you get dressed.  Do not have throw rugs and other things on the floor that can make you trip. What can I do in the kitchen?  Clean up any spills right away.  Avoid walking on wet floors.  Keep items that you use a lot in easy-to-reach places.  If you need to reach something above you, use a strong step stool that has a grab bar.  Keep electrical cords out of the way.  Do not use floor polish or wax that makes floors slippery. If you must use wax, use  non-skid floor wax.  Do not have throw rugs and other things on the floor that can make you trip. What can I do with my stairs?  Do not leave any items on the stairs.  Make sure that there are handrails on both sides of the stairs and use them. Fix handrails that are broken or loose. Make sure that handrails are as long as the stairways.  Check any carpeting to make sure that it is firmly attached to the stairs. Fix any carpet that is loose or worn.  Avoid having throw rugs at the top or bottom of the stairs. If you do have throw rugs, attach them to the floor with carpet tape.  Make sure that you have a light switch at the top of the stairs and the bottom of the stairs. If you do not have them, ask someone to add them for you. What else can I do to help prevent falls?  Wear shoes that:  Do not have high heels.  Have rubber bottoms.  Are comfortable and fit you  well.  Are closed at the toe. Do not wear sandals.  If you use a stepladder:  Make sure that it is fully opened. Do not climb a closed stepladder.  Make sure that both sides of the stepladder are locked into place.  Ask someone to hold it for you, if possible.  Clearly mark and make sure that you can see:  Any grab bars or handrails.  First and last steps.  Where the edge of each step is.  Use tools that help you move around (mobility aids) if they are needed. These include:  Canes.  Walkers.  Scooters.  Crutches.  Turn on the lights when you go into a dark area. Replace any light bulbs as soon as they burn out.  Set up your furniture so you have a clear path. Avoid moving your furniture around.  If any of your floors are uneven, fix them.  If there are any pets around you, be aware of where they are.  Review your medicines with your doctor. Some medicines can make you feel dizzy. This can increase your chance of falling. Ask your doctor what other things that you can do to help prevent falls. This information is not intended to replace advice given to you by your health care provider. Make sure you discuss any questions you have with your health care provider. Document Released: 02/26/2009 Document Revised: 10/08/2015 Document Reviewed: 06/06/2014 Elsevier Interactive Patient Education  2017 [employer].

## 2019-02-19 NOTE — Progress Notes (Addendum)
Subjective:   Pamela Hendrix is a 73 y.o. female who presents for Medicare Annual (Subsequent) preventive examination.  Virtual Visit via Telephone Note  I connected with [name] on 02/19/19 at  1:30 PM EDT by telephone and verified that I am speaking with the correct person using two identifiers.  Medicare Annual Wellness visit completed telephonically due to Covid-19 pandemic.   Location: Patient: home Provider: office   I discussed the limitations, risks, security and privacy concerns of performing an evaluation and management service by telephone and the availability of in person appointments. The patient expressed understanding and agreed to proceed.  Some vital signs may be absent or patient reported.   Clemetine Marker, LPN     Review of Systems:   Cardiac Risk Factors include: advanced age (>36mn, >>19women);hypertension;dyslipidemia     Objective:     Vitals: BP 138/69   Pulse (!) 52   Temp 98.2 F (36.8 C) (Oral)   Ht '5\' 3"'$  (1.6 m)   Wt 157 lb (71.2 kg)   BMI 27.81 kg/m   Body mass index is 27.81 kg/m.  Advanced Directives 02/19/2019 09/04/2017 04/06/2017 04/06/2017  Does Patient Have a Medical Advance Directive? Yes Yes Yes Yes  Type of [place]amed[place]of [place] will  Does patient want to make changes to medical advance directive? - - No - Patient declined -  Copy of HClarein Chart? No - copy requested No - copy requested No - copy requested No - copy requested    Tobacco Social History   Tobacco Use  Smoking Status Never Smoker  Smokeless Tobacco Never Used  Tobacco Comment   smoking cessation materials not required     Counseling given: Not Answered Comment: smoking cessation materials not required   Clinical Intake:  Pre-visit preparation  completed: Yes  Pain : No/denies pain     BMI - recorded: 27.81 Nutritional Status: BMI 25 -29 Overweight Nutritional Risks: None Diabetes: No  How often do you need to have someone help you when you read instructions, pamphlets, or other written materials from your doctor or pharmacy?: 1 - Never  Interpreter Needed?: No  Information entered by :: KClemetine MarkerLPN  Past Medical History:  Diagnosis Date  . Allergy   . Diverticulosis   . GERD (gastroesophageal reflux disease)   . Hiatal hernia   . Hypertension   . Migraine with aura    states she doesn't have a headache, just the aura  . Thyroid disease    Past Surgical History:  Procedure Laterality Date  . CHOLECYSTECTOMY    . COLONOSCOPY  06/02/2014   cleared for 10 yrs- Dr RRayann Heman . COLONOSCOPY N/A 04/07/2017   Procedure: COLONOSCOPY;  Surgeon: VLin Landsman MD;  [facility];  Service: Gastroenterology;  Laterality: N/A;  . ESOPHAGEAL DILATION    . ESOPHAGOGASTRODUODENOSCOPY N/A 04/07/2017   Procedure: ESOPHAGOGASTRODUODENOSCOPY (EGD);  Surgeon: VLin Landsman MD;  [facility];  Service: Gastroenterology;  Laterality: N/A;  . TONSILLECTOMY    . UPPER GI ENDOSCOPY  06/02/2014   Family History  Problem Relation Age of Onset  . Kidney disease Mother   . Cancer Mother        brain  . Heart disease Father   . Heart attack Father   . Lupus Sister   . Diabetes Sister   . Thyroid  disease Sister   . Rheum arthritis Sister   . Diabetes Sister   . Multiple myeloma Sister   . Thyroid disease Sister   . Rheum arthritis Sister   . Breast cancer Maternal Aunt    Social History   Socioeconomic History  . Marital status: Married    Spouse name: Pamela Hendrix  . Number of children: 1  . Years of education: some college  . Highest education level: 12th grade  Occupational History  . Occupation: Retired  Scientific laboratory technician  . Financial resource strain: Not hard at all  . Food insecurity     Worry: Never true    Inability: Never true  . Transportation needs    Medical: No    Non-medical: No  Tobacco Use  . Smoking status: Never Smoker  . Smokeless tobacco: Never Used  . Tobacco comment: smoking cessation materials not required  Substance and Sexual Activity  . Alcohol use: No    Alcohol/week: 0.0 standard drinks  . Drug use: No  . Sexual activity: Never    Birth control/protection: Post-menopausal  Lifestyle  . Physical activity    Days per week: 0 days    Minutes per session: 0 min  . Stress: Only a little  Relationships  . Social Herbalist on phone: Patient refused    Gets together: Patient refused    Attends religious service: Patient refused    Active member of club or organization: Patient refused    Attends meetings of clubs or organizations: Patient refused    Relationship status: Married  Other Topics Concern  . Not on file  Social History Narrative  . Not on file    Outpatient Encounter Medications as of 02/19/2019  Medication Sig  . aspirin EC 81 MG tablet Take 81 mg by mouth daily.  . cetirizine (ZYRTEC) 10 MG tablet Take 10 mg by mouth daily. otc  . Cholecalciferol (VITAMIN D-3) 1000 units CAPS Take 1 capsule by mouth daily.  . fluticasone (FLONASE) 50 MCG/ACT nasal spray Place 1 spray into both nostrils daily. (Patient taking differently: Place 1 spray into both nostrils daily. PRN)  . levothyroxine (SYNTHROID) 88 MCG tablet Take 1 tablet (88 mcg total) by mouth daily.  Marland Kitchen losartan (COZAAR) 50 MG tablet Take 1 tablet by mouth daily.  Marland Kitchen lovastatin (MEVACOR) 20 MG tablet Take 1 tablet (20 mg total) by mouth daily.  . montelukast (SINGULAIR) 10 MG tablet Take 1 tablet (10 mg total) by mouth daily.  . polyethylene glycol (MIRALAX / GLYCOLAX) 17 g packet Take 17 g by mouth daily.   No facility-administered encounter medications on file as of 02/19/2019.     Activities of Daily Living In your present state of health, do you have any  difficulty performing the following activities: 02/19/2019 01/01/2019  Hearing? N N  Comment declines hearing aids -  Vision? N N  Difficulty concentrating or making decisions? N N  Walking or climbing stairs? N N  Dressing or bathing? N N  Doing errands, shopping? N N  Preparing Food and eating ? N -  Using the Toilet? N -  In the past six months, have you accidently leaked urine? Y -  Comment urge incontinence only occasionally -  Do you have problems with loss of bowel control? N -  Managing your Medications? N -  Managing your Finances? N -  Housekeeping or managing your Housekeeping? N -  Some recent data might be hidden    Patient Care  Team: Hubbard Hartshorn, FNP as PCP - General (Family Medicine) Solum, Betsey Holiday, MD as Physician Assistant (Endocrinology) Corey Skains, MD as Consulting Physician (Cardiology) Lin Landsman, MD as Consulting Physician (Gastroenterology)    Assessment:   This is a routine wellness examination for [place].  Exercise Activities and Dietary recommendations Exercise limited by: None identified  Goals    . DIET - INCREASE WATER INTAKE     Recommend to drink at least 6-8 8oz glasses of water per day.    . Increase physical activity     Recommend increasing physical activity to at least 3 days per week       Fall Risk Fall Risk  02/19/2019 01/01/2019 10/19/2018 09/24/2018 01/22/2018  Falls in the past year? 0 0 0 0 No  Comment - - - - -  Number falls in past yr: 0 0 0 0 -  Injury with Fall? 0 0 0 0 -  Risk for fall due to : - - - Other (Comment) -  Risk for fall due to: Comment - - - - -  Follow up Falls prevention discussed - - Falls evaluation completed -   FALL RISK PREVENTION PERTAINING TO THE HOME:  Any stairs in or around the home? Yes  If so, do they handrails? Yes   Home free of loose throw rugs in walkways, pet beds, electrical cords, etc? Yes  Adequate lighting in your home to reduce risk of falls? Yes   ASSISTIVE DEVICES  UTILIZED TO PREVENT FALLS:  Life alert? No  Use of a cane, walker or w/c? No  Grab bars in the bathroom? Yes  Shower chair or bench in shower? Yes  Elevated toilet seat or a handicapped toilet? No   DME ORDERS:  DME order needed?  No   TIMED UP AND GO:  Was the test performed? No . Telephonic visit.    Education: Fall risk prevention has been discussed.  Intervention(s) required? No   Depression Screen PHQ 2/9 Scores 02/19/2019 01/01/2019 10/19/2018 09/24/2018  PHQ - 2 Score 0 2 0 1  PHQ- 9 Score - 4 0 3     Cognitive Function     6CIT Screen 02/19/2019 09/04/2017  What Year? 0 points 0 points  What month? 0 points 0 points  What time? 0 points 0 points  Count back from 20 0 points 0 points  Months in reverse 0 points 0 points  Repeat phrase 0 points 0 points  Total Score 0 0    Immunization History  Administered Date(s) Administered  . Fluad Quad(high Dose 65+) 02/16/2019  . H1N1 03/01/2017  . Influenza, High Dose Seasonal PF 03/01/2017, 01/22/2018  . Influenza,inj,Quad PF,6+ Mos 04/02/2015  . Pneumococcal Conjugate-13 06/19/2015  . Pneumococcal Polysaccharide-23 03/05/2012  . Tdap 10/14/2010  . Zoster 02/25/2011    Qualifies for Shingles Vaccine? Yes  Zostavax completed 2012. Due for Shingrix. Education has been provided regarding the importance of this vaccine. Pt has been advised to call insurance company to determine out of pocket expense. Advised may also receive vaccine at local pharmacy or Health Dept. Verbalized acceptance and understanding.  Tdap: Up to date  Flu Vaccine: Up to date  Pneumococcal Vaccine: Up to date   Screening Tests Health Maintenance  Topic Date Due  . MAMMOGRAM  01/30/2020  . TETANUS/TDAP  10/13/2020  . COLONOSCOPY  04/07/2022  . INFLUENZA VACCINE  Completed  . DEXA SCAN  Completed  . Hepatitis C Screening  Completed  .  PNA vac Low Risk Adult  Completed    Cancer Screenings:  Colorectal Screening: Completed 04/07/17.  Repeat every 5 years;  Mammogram: Completed 01/30/19. Repeat every year;  Bone Density: Completed 09/24/15. Results reflect OSTEOPENIA. Repeat every 2 years. Declines repeat screening until next year.   Lung Cancer Screening: (Low Dose CT Chest recommended if Age 63-80 years, 30 pack-year currently smoking OR have quit w/in 15years.) does not qualify.   Additional Screening:  Hepatitis C Screening: does qualify; Completed 06/28/16  Vision Screening: Recommended annual ophthalmology exams for early detection of glaucoma and other disorders of the eye. Is the patient up to date with their annual eye exam?  No  - postponed due to Covid Who is the provider or what is the name of the office in which the pt attends annual eye exams? [facility] Screening: Recommended annual dental exams for proper oral hygiene  Community Resource Referral:  CRR required this visit?  No       Plan:     I have personally reviewed and addressed the Medicare Annual Wellness questionnaire and have noted the following in the patient's chart:  A. Medical and social history B. Use of alcohol, tobacco or illicit drugs  C. Current medications and supplements D. Functional ability and status E.  Nutritional status F.  Physical activity G. Advance directives H. List of other physicians I.  Hospitalizations, surgeries, and ER visits in previous 12 months J.  [place] such as hearing and vision if needed, cognitive and depression L. Referrals and appointments   In addition, I have reviewed and discussed with patient certain preventive protocols, quality metrics, and best practice recommendations. A written personalized care plan for preventive services as well as general preventive health recommendations were provided to patient.   Signed,  Clemetine Marker, LPN Nurse Health Advisor   Nurse Notes: pt states she thinks generic singulair may have side effect of feeling grouchy/irritable  but she also contributes this to her age and having to stay home more due to covid. Pt pans to discuss at next visit.

## 2019-03-17 ENCOUNTER — Other Ambulatory Visit: Payer: Self-pay | Admitting: Family Medicine

## 2019-03-17 DIAGNOSIS — E039 Hypothyroidism, unspecified: Secondary | ICD-10-CM

## 2019-05-01 ENCOUNTER — Encounter: Payer: Self-pay | Admitting: Family Medicine

## 2019-05-01 ENCOUNTER — Ambulatory Visit (INDEPENDENT_AMBULATORY_CARE_PROVIDER_SITE_OTHER): Payer: Medicare HMO | Admitting: Family Medicine

## 2019-05-01 ENCOUNTER — Other Ambulatory Visit: Payer: Self-pay

## 2019-05-01 VITALS — BP 140/64 | HR 59 | Temp 97.7°F | Ht 63.0 in | Wt 158.0 lb

## 2019-05-01 DIAGNOSIS — E559 Vitamin D deficiency, unspecified: Secondary | ICD-10-CM

## 2019-05-01 DIAGNOSIS — E782 Mixed hyperlipidemia: Secondary | ICD-10-CM | POA: Diagnosis not present

## 2019-05-01 DIAGNOSIS — D696 Thrombocytopenia, unspecified: Secondary | ICD-10-CM

## 2019-05-01 DIAGNOSIS — D649 Anemia, unspecified: Secondary | ICD-10-CM

## 2019-05-01 DIAGNOSIS — J309 Allergic rhinitis, unspecified: Secondary | ICD-10-CM

## 2019-05-01 DIAGNOSIS — E039 Hypothyroidism, unspecified: Secondary | ICD-10-CM | POA: Diagnosis not present

## 2019-05-01 DIAGNOSIS — I10 Essential (primary) hypertension: Secondary | ICD-10-CM

## 2019-05-01 MED ORDER — FLUTICASONE PROPIONATE 50 MCG/ACT NA SUSP: 2.0000 | Freq: Every day | NASAL | 2 refills | Status: DC

## 2019-05-01 NOTE — Progress Notes (Signed)
Name: Pamela Hendrix   MRN: [id number]    DOB: Dec 26, 1945   Date:05/01/2019       Progress Note  Subjective:    Chief Complaint  Chief Complaint  Patient presents with  . Follow-up  . Allergic Rhinitis   . Hyperlipidemia  . Hypertension  . Medication Refill    I connected with  [name]  on 05/01/19 at  1:00 PM EST by a video enabled telemedicine application and verified that I am speaking with the correct person using two identifiers.  I discussed the limitations of evaluation and management by telemedicine and the availability of in person appointments. The patient expressed understanding and agreed to proceed. Staff also discussed with the patient that there may be a patient responsible charge related to this service. Patient Location: home Provider Location: [facility] clinic Additional Individuals present: none  HPI   AR:  Request refill on flonase  Hyperlipidemia: Current Medication Regimen:  Lovastatin 20 mg daily Last Lipids: Lab Results  Component Value Date   CHOL 150 09/24/2018   HDL 64 09/24/2018   LDLCALC 66 09/24/2018   TRIG 115 09/24/2018   CHOLHDL 2.3 09/24/2018  - Current Diet:  No particular diet right now - Denies: Chest pain, shortness of breath, myalgias. - Documented aortic atherosclerosis?  I do not see dx in chart review of our EMR and care everywhere - Risk factors for atherosclerosis: hypercholesterolemia and hypertension  Hypertension:  Currently managed on losartan 50 mg daily, BP has been stable and well controlled, reviewed VS, flowsheet and past visits Pt reports good med compliance and denies any SE.  No lightheadedness, hypotension, syncope. Blood pressure today is well controlled. BP Readings from Last 3 Encounters:  05/01/19 140/64  02/19/19 138/69  01/01/19 (!) 151/73  She recheck BP 128/63, HR 65 left arm  She does check her BP at home, this morning she checked it and it was a little higher.  Pt denies CP,  SOB, exertional sx, LE edema, palpitation, Ha's, visual disturbances Dietary efforts for BP?  Generally healthy and low salt   She does see cardiology - Dr. Nehemiah Massed, had cough with ACEI - added to allergy list today  Sees neuro f/up on migraines - well controlled only about 5 a year, she keeps a journal about them and has f/up appt  Hypothyroidism: Current Medication Regimen: 88 mcg daily synthroid Takes medicine daily in the am prior to food and other meds Current Symptoms: she stays cold - unchanged for her and she denies fatigue, weight changes, heat/cold intolerance, bowel/skin changes or CVS symptoms She has stable ankle swelling and does feel a little depressed with COVID and election etc. Most recent results are below; we will not be repeating labs today. Lab Results  Component Value Date   TSH 0.77 01/16/2019   1000 IU daily of Vit D- she has been low and would like it rechecked, has decreased her supplement dose accidentally was on 5000 IU before  Pt also asks that her ferritin be checked, she does have anemia, last two labs appear stable with Hgb may 2020 11.6 and sept 2020 11.6, iron panel has not been done for over 2 years.  She does see GI but hasn't seen in a while and needs to make f/up appt she states.  Other than fairly stable cold intolerance she has not experienced any worsening fatigue, shortness of breath, or exertional symptoms.  She has intermittent palpitations that she can hear and feel especially at  night and they have remained constant she tends to be bradycardic and has not had any tachycardia.  She denies any hematemesis, hematochezia or melena.  She is not currently on an iron supplement. Her last CBC did show thrombocytopenia, increased MPV and decreased hemoglobin and hematocrit, will recheck CBC and iron panel  Patient Active Problem List   Diagnosis Date Noted  . Migraine aura without headache 01/01/2019  . Decreased GFR 01/01/2019  . CKD (chronic kidney  disease) stage 3, GFR 30-59 ml/min 12/04/2018  . PFO (patent foramen ovale) 07/31/2018  . Abnormal ECG 07/13/2018  . Bradycardia 07/13/2018  . Primary osteoarthritis of left knee 11/20/2017  . History of GI diverticular bleed 05/18/2017  . Vitamin B12 deficiency anemia 05/18/2017  . Chronic allergic rhinitis 06/28/2016  . Mixed hyperlipidemia 06/28/2016  . Hiatal hernia 08/28/2014  . Hypothyroid 08/28/2014  . Headache 10/25/2013  . Hypertension 10/25/2013  . Numbness and tingling 10/25/2013  . Obesity 10/25/2013  . Visual disturbance 10/25/2013  . Thyroid nodule 09/19/2013  . Onychomycosis due to dermatophyte 08/21/2013    Past Surgical History:  Procedure Laterality Date  . CHOLECYSTECTOMY    . COLONOSCOPY  06/02/2014   cleared for 10 yrs- Dr Rayann Heman  . COLONOSCOPY N/A 04/07/2017   Procedure: COLONOSCOPY;  Surgeon: Lin Landsman, MD;  Location: [facility] ENDOSCOPY;  Service: Gastroenterology;  Laterality: N/A;  . ESOPHAGEAL DILATION    . ESOPHAGOGASTRODUODENOSCOPY N/A 04/07/2017   Procedure: ESOPHAGOGASTRODUODENOSCOPY (EGD);  Surgeon: Lin Landsman, MD;  Location: [facility] ENDOSCOPY;  Service: Gastroenterology;  Laterality: N/A;  . TONSILLECTOMY    . UPPER GI ENDOSCOPY  06/02/2014    Family History  Problem Relation Age of Onset  . Kidney disease Mother   . Cancer Mother        brain  . Heart disease Father   . Heart attack Father   . Lupus Sister   . Diabetes Sister   . Thyroid disease Sister   . Rheum arthritis Sister   . Diabetes Sister   . Multiple myeloma Sister   . Thyroid disease Sister   . Rheum arthritis Sister   . Breast cancer Maternal Aunt     Social History   Socioeconomic History  . Marital status: Married    Spouse name: Doren Custard  . Number of children: 1  . Years of education: some college  . Highest education level: 12th grade  Occupational History  . Occupation: Retired  Tobacco Use  . Smoking status: Never Smoker  . Smokeless tobacco:  Never Used  . Tobacco comment: smoking cessation materials not required  Substance and Sexual Activity  . Alcohol use: No    Alcohol/week: 0.0 standard drinks  . Drug use: No  . Sexual activity: Never    Birth control/protection: Post-menopausal  Other Topics Concern  . Not on file  Social History Narrative  . Not on file   Social Determinants of Health   Financial Resource Strain:   . Difficulty of Paying Living Expenses: Not on file  Food Insecurity:   . Worried About Charity fundraiser in the Last Year: Not on file  . Ran Out of Food in the Last Year: Not on file  Transportation Needs:   . Lack of Transportation (Medical): Not on file  . Lack of Transportation (Non-Medical): Not on file  Physical Activity:   . Days of Exercise per Week: Not on file  . Minutes of Exercise per Session: Not on file  Stress: No Stress Concern  Present  . Feeling of Stress : Only a little  Social Connections:   . Frequency of Communication with Friends and Family: Not on file  . Frequency of Social Gatherings with Friends and Family: Not on file  . Attends Religious Services: Not on file  . Active Member of Clubs or Organizations: Not on file  . Attends Archivist Meetings: Not on file  . Marital Status: Not on file  Intimate Partner Violence:   . Fear of Current or Ex-Partner: Not on file  . Emotionally Abused: Not on file  . Physically Abused: Not on file  . Sexually Abused: Not on file     Current Outpatient Medications:  .  aspirin EC 81 MG tablet, Take 81 mg by mouth daily., Disp: , Rfl:  .  cetirizine (ZYRTEC) 10 MG tablet, Take 10 mg by mouth daily. otc, Disp: , Rfl:  .  Cholecalciferol (VITAMIN D-3) 1000 units CAPS, Take 1 capsule by mouth daily., Disp: , Rfl:  .  fluticasone (FLONASE) 50 MCG/ACT nasal spray, Place 1 spray into both nostrils daily. (Patient taking differently: Place 1 spray into both nostrils daily. PRN), Disp: 16 g, Rfl: 11 .  levothyroxine (SYNTHROID)  88 MCG tablet, Take 1 tablet by mouth once daily, Disp: 90 tablet, Rfl: 0 .  losartan (COZAAR) 50 MG tablet, Take 1 tablet by mouth daily., Disp: , Rfl:  .  lovastatin (MEVACOR) 20 MG tablet, Take 1 tablet (20 mg total) by mouth daily., Disp: 90 tablet, Rfl: 3 .  montelukast (SINGULAIR) 10 MG tablet, Take 1 tablet (10 mg total) by mouth daily., Disp: 90 tablet, Rfl: 3 .  polyethylene glycol (MIRALAX / GLYCOLAX) 17 g packet, Take 17 g by mouth daily., Disp: , Rfl:   Allergies  Allergen Reactions  . Codeine Nausea Only and Nausea And Vomiting    I personally reviewed active problem list, medication list, allergies, family history, social history, health maintenance, notes from last encounter, lab results, imaging with the patient/caregiver today.  Review of Systems  Constitutional: Negative.   HENT: Negative.   Eyes: Negative.   Respiratory: Negative.   Cardiovascular: Negative.   Gastrointestinal: Negative.   Endocrine: Negative.   Genitourinary: Negative.   Musculoskeletal: Negative.   Skin: Negative.   Allergic/Immunologic: Negative.   Neurological: Negative.   Hematological: Negative.   Psychiatric/Behavioral: Negative.   All other systems reviewed and are negative.     Objective:    Virtual encounter, vitals limited, only able to obtain the following Today's Vitals   05/01/19 1006  BP: 140/64  Pulse: (!) 59  Temp: 97.7 F (36.5 C)  Weight: 158 lb (71.7 kg)  Height: '5\' 3"'$  (1.6 m)   Body mass index is 27.99 kg/m. Nursing Note and Vital Signs reviewed.  Physical Exam Vitals and nursing note reviewed.  Constitutional:      Appearance: She is well-developed.  HENT:     Head: Normocephalic and atraumatic.     Nose: Nose normal.  Eyes:     General:        Right eye: No discharge.        Left eye: No discharge.     Conjunctiva/sclera: Conjunctivae normal.  Neck:     Trachea: No tracheal deviation.  Cardiovascular:     Rate and Rhythm: Normal rate and regular  rhythm.  Pulmonary:     Effort: Pulmonary effort is normal. No respiratory distress.     Breath sounds: No stridor.  Musculoskeletal:  General: Normal range of motion.  Skin:    General: Skin is warm and dry.     Findings: No rash.  Neurological:     Mental Status: She is alert.     Motor: No abnormal muscle tone.     Coordination: Coordination normal.  Psychiatric:        Behavior: Behavior normal.     PE limited by telephone encounter  No results found for this or any previous visit (from the past 72 hour(s)).  PHQ2/9: Depression screen [facility] 2/9 05/01/2019 02/19/2019 01/01/2019 10/19/2018 09/24/2018  Decreased Interest 0 0 1 0 1  Down, Depressed, Hopeless 2 0 1 0 0  PHQ - 2 Score 2 0 2 0 1  Altered sleeping 0 - 0 0 0  Tired, decreased energy 0 - 1 0 1  Change in appetite 0 - 1 0 1  Feeling bad or failure about yourself  0 - 0 0 0  Trouble concentrating 0 - 0 0 0  Moving slowly or fidgety/restless 0 - 0 0 0  Suicidal thoughts 0 - 0 0 0  PHQ-9 Score 2 - 4 0 3  Difficult doing work/chores Not difficult at all - Not difficult at all Not difficult at all Not difficult at all   PHQ-2/9 Result is negative.    Fall Risk: Fall Risk  05/01/2019 02/19/2019 01/01/2019 10/19/2018 09/24/2018  Falls in the past year? 0 0 0 0 0  Comment - - - - -  Number falls in past yr: 0 0 0 0 0  Injury with Fall? 0 0 0 0 0  Risk for fall due to : - - - - Other (Comment)  Risk for fall due to: Comment - - - - -  Follow up - Falls prevention discussed - - Falls evaluation completed     Assessment and Plan:   1. Essential hypertension Well controlled, stable - CMP w GFR  2. Mixed hyperlipidemia Well controlled with meds, no myalgias, stable, recheck meds - CMP w GFR - Lipid Panel  3. Chronic allergic rhinitis Refill on flonase - fluticasone (FLONASE) 50 MCG/ACT nasal spray; Place 2 sprays into both nostrils daily. PRN  Dispense: 16 g; Refill: 2  4. Hypothyroidism, unspecified  type Recent labs normal, will not recheck TSH, no new sx suggestive of chemical hypothyroid, will recheck CBC - CBC w/ Diff  5. Thrombocytopenia ([place]) New low platelets with last labs in sept, recheck - CBC w/ Diff  6. Anemia, unspecified type F/up on anemia which has been stable, she is still on iron supplement w/o iron panel or ferritin checked in the past 2 years, she requests recheck, agree its indicated and appropriate - CBC w/ Diff - Iron, TIBC and Ferritin Panel  7. Vitamin D deficiency Recheck level with decreased supplement  - Vit D   She does have f/up with neuro, cardiology, and is going to call to see endo as well.  I discussed the assessment and treatment plan with the patient. The patient was provided an opportunity to ask questions and all were answered. The patient agreed with the plan and demonstrated an understanding of the instructions.  The patient was advised to call back or seek an in-person evaluation if the symptoms worsen or if the condition fails to improve as anticipated.  I provided 18 minutes of non-face-to-face time during this encounter.  Delsa Grana, PA-C 04/30/2009:16 AM

## 2019-05-06 DIAGNOSIS — E782 Mixed hyperlipidemia: Secondary | ICD-10-CM | POA: Diagnosis not present

## 2019-05-06 DIAGNOSIS — E559 Vitamin D deficiency, unspecified: Secondary | ICD-10-CM | POA: Diagnosis not present

## 2019-05-06 DIAGNOSIS — D696 Thrombocytopenia, unspecified: Secondary | ICD-10-CM | POA: Diagnosis not present

## 2019-05-06 DIAGNOSIS — E039 Hypothyroidism, unspecified: Secondary | ICD-10-CM | POA: Diagnosis not present

## 2019-05-06 DIAGNOSIS — D649 Anemia, unspecified: Secondary | ICD-10-CM | POA: Diagnosis not present

## 2019-05-06 DIAGNOSIS — I10 Essential (primary) hypertension: Secondary | ICD-10-CM | POA: Diagnosis not present

## 2019-05-07 LAB — IRON,TIBC AND FERRITIN PANEL
%SAT: 29 % (calc) (ref 16–45)
Ferritin: 67 ng/mL (ref 16–288)
Iron: 104 ug/dL (ref 45–160)
TIBC: 353 mcg/dL (calc) (ref 250–450)

## 2019-05-07 LAB — CBC WITH DIFFERENTIAL/PLATELET
Absolute Monocytes: 390 cells/uL (ref 200–950)
Basophils Absolute: 42 cells/uL (ref 0–200)
Basophils Relative: 0.7 %
Eosinophils Absolute: 60 cells/uL (ref 15–500)
Eosinophils Relative: 1 %
HCT: 34.8 % — ABNORMAL LOW (ref 35.0–45.0)
Hemoglobin: 11.7 g/dL (ref 11.7–15.5)
Lymphs Abs: 1134 cells/uL (ref 850–3900)
MCH: 32.7 pg (ref 27.0–33.0)
MCHC: 33.6 g/dL (ref 32.0–36.0)
MCV: 97.2 fL (ref 80.0–100.0)
MPV: 13 fL — ABNORMAL HIGH (ref 7.5–12.5)
Monocytes Relative: 6.5 %
Neutro Abs: 4374 cells/uL (ref 1500–7800)
Neutrophils Relative %: 72.9 %
Platelets: 152 10*3/uL (ref 140–400)
RBC: 3.58 10*6/uL — ABNORMAL LOW (ref 3.80–5.10)
RDW: 12.5 % (ref 11.0–15.0)
Total Lymphocyte: 18.9 %
WBC: 6 10*3/uL (ref 3.8–10.8)

## 2019-05-07 LAB — COMPLETE METABOLIC PANEL WITH GFR
AG Ratio: 1.4 (calc) (ref 1.0–2.5)
ALT: 11 U/L (ref 6–29)
AST: 17 U/L (ref 10–35)
Albumin: 4.1 g/dL (ref 3.6–5.1)
Alkaline phosphatase (APISO): 62 U/L (ref 37–153)
BUN/Creatinine Ratio: 13 (calc) (ref 6–22)
BUN: 14 mg/dL (ref 7–25)
CO2: 29 mmol/L (ref 20–32)
Calcium: 9.3 mg/dL (ref 8.6–10.4)
Chloride: 106 mmol/L (ref 98–110)
Creat: 1.04 mg/dL — ABNORMAL HIGH (ref 0.60–0.93)
GFR, Est African American: 62 mL/min/{1.73_m2} (ref >=60)
GFR, Est Non African American: 53 mL/min/{1.73_m2} — ABNORMAL LOW (ref >=60)
Globulin: 3 g/dL (calc) (ref 1.9–3.7)
Glucose, Bld: 88 mg/dL (ref 65–99)
Potassium: 4.7 mmol/L (ref 3.5–5.3)
Sodium: 142 mmol/L (ref 135–146)
Total Bilirubin: 0.9 mg/dL (ref 0.2–1.2)
Total Protein: 7.1 g/dL (ref 6.1–8.1)

## 2019-05-07 LAB — LIPID PANEL
Cholesterol: 164 mg/dL (ref <200)
HDL: 59 mg/dL (ref >=50)
LDL Cholesterol (Calc): 84 mg/dL (calc)
Non-HDL Cholesterol (Calc): 105 mg/dL (calc) (ref <130)
Total CHOL/HDL Ratio: 2.8 (calc) (ref <5.0)
Triglycerides: 115 mg/dL (ref <150)

## 2019-05-07 LAB — VITAMIN D 25 HYDROXY (VIT D DEFICIENCY, FRACTURES): Vit D, 25-Hydroxy: 33 ng/mL (ref 30–100)

## 2019-06-11 DIAGNOSIS — R001 Bradycardia, unspecified: Secondary | ICD-10-CM | POA: Diagnosis not present

## 2019-06-11 DIAGNOSIS — I10 Essential (primary) hypertension: Secondary | ICD-10-CM | POA: Diagnosis not present

## 2019-06-11 DIAGNOSIS — N1831 Chronic kidney disease, stage 3a: Secondary | ICD-10-CM | POA: Diagnosis not present

## 2019-06-11 DIAGNOSIS — E782 Mixed hyperlipidemia: Secondary | ICD-10-CM | POA: Diagnosis not present

## 2019-07-08 ENCOUNTER — Telehealth: Payer: Self-pay | Admitting: Family Medicine

## 2019-07-08 ENCOUNTER — Other Ambulatory Visit: Payer: Self-pay

## 2019-07-08 DIAGNOSIS — E039 Hypothyroidism, unspecified: Secondary | ICD-10-CM

## 2019-07-08 MED ORDER — LEVOTHYROXINE SODIUM 88 MCG PO TABS: 88.0000 ug | ORAL_TABLET | Freq: Every day | ORAL | 3 refills | Status: DC

## 2019-07-08 NOTE — Telephone Encounter (Signed)
RX REFILL levothyroxine (SYNTHROID) 88 MCG tablet  [place] [place] Phone:  [phone]  Fax:  [phone]       Patient has been out of medication for two days.

## 2019-07-08 NOTE — Telephone Encounter (Signed)
Order pend for refill 

## 2019-07-09 NOTE — Telephone Encounter (Signed)
Mickel Baas from [facility] stating the medication Manufacture has discontinued  Patient has not gotten medicine in a couple days, so pharmacy states they are refilling medication. Call back [phone]

## 2019-07-09 NOTE — Telephone Encounter (Signed)
Yes, leisa said that's always fine

## 2019-07-09 NOTE — Telephone Encounter (Signed)
Can manufacture be changes

## 2019-07-10 NOTE — Telephone Encounter (Signed)
Pharmacy notified.

## 2019-07-11 DIAGNOSIS — M1711 Unilateral primary osteoarthritis, right knee: Secondary | ICD-10-CM | POA: Diagnosis not present

## 2019-07-11 DIAGNOSIS — M2391 Unspecified internal derangement of right knee: Secondary | ICD-10-CM | POA: Diagnosis not present

## 2019-07-11 DIAGNOSIS — M25561 Pain in right knee: Secondary | ICD-10-CM | POA: Diagnosis not present

## 2019-07-17 DIAGNOSIS — R69 Illness, unspecified: Secondary | ICD-10-CM | POA: Diagnosis not present

## 2019-08-01 ENCOUNTER — Other Ambulatory Visit: Payer: Self-pay | Admitting: Orthopedic Surgery

## 2019-08-01 DIAGNOSIS — M25361 Other instability, right knee: Secondary | ICD-10-CM

## 2019-08-01 DIAGNOSIS — M2391 Unspecified internal derangement of right knee: Secondary | ICD-10-CM

## 2019-08-01 DIAGNOSIS — M1711 Unilateral primary osteoarthritis, right knee: Secondary | ICD-10-CM

## 2019-08-14 DIAGNOSIS — R4189 Other symptoms and signs involving cognitive functions and awareness: Secondary | ICD-10-CM | POA: Diagnosis not present

## 2019-08-14 DIAGNOSIS — G43109 Migraine with aura, not intractable, without status migrainosus: Secondary | ICD-10-CM | POA: Diagnosis not present

## 2019-08-15 ENCOUNTER — Other Ambulatory Visit: Payer: Self-pay

## 2019-08-15 ENCOUNTER — Ambulatory Visit
Admission: RE | Admit: 2019-08-15 | Discharge: 2019-08-15 | Disposition: A | Discharge status: Home / Self Care | Payer: Medicare HMO | Source: Ambulatory Visit | Attending: Orthopedic Surgery | Admitting: Orthopedic Surgery

## 2019-08-15 DIAGNOSIS — M2391 Unspecified internal derangement of right knee: Secondary | ICD-10-CM | POA: Diagnosis not present

## 2019-08-15 DIAGNOSIS — M1711 Unilateral primary osteoarthritis, right knee: Secondary | ICD-10-CM

## 2019-08-15 DIAGNOSIS — M25561 Pain in right knee: Secondary | ICD-10-CM | POA: Diagnosis not present

## 2019-08-15 DIAGNOSIS — M25361 Other instability, right knee: Secondary | ICD-10-CM | POA: Diagnosis not present

## 2019-08-20 DIAGNOSIS — M7051 Other bursitis of knee, right knee: Secondary | ICD-10-CM | POA: Diagnosis not present

## 2019-08-20 DIAGNOSIS — S83241A Other tear of medial meniscus, current injury, right knee, initial encounter: Secondary | ICD-10-CM | POA: Diagnosis not present

## 2019-08-20 DIAGNOSIS — M1711 Unilateral primary osteoarthritis, right knee: Secondary | ICD-10-CM | POA: Diagnosis not present

## 2019-08-21 DIAGNOSIS — M7051 Other bursitis of knee, right knee: Secondary | ICD-10-CM | POA: Diagnosis not present

## 2019-08-21 DIAGNOSIS — M25561 Pain in right knee: Secondary | ICD-10-CM | POA: Diagnosis not present

## 2019-08-28 DIAGNOSIS — M25561 Pain in right knee: Secondary | ICD-10-CM | POA: Diagnosis not present

## 2019-08-28 DIAGNOSIS — G8929 Other chronic pain: Secondary | ICD-10-CM | POA: Diagnosis not present

## 2019-08-28 DIAGNOSIS — M6281 Muscle weakness (generalized): Secondary | ICD-10-CM | POA: Diagnosis not present

## 2019-08-30 ENCOUNTER — Ambulatory Visit: Payer: Medicare HMO | Admitting: Family Medicine

## 2019-09-03 ENCOUNTER — Encounter: Payer: Self-pay | Admitting: Family Medicine

## 2019-09-03 ENCOUNTER — Ambulatory Visit (INDEPENDENT_AMBULATORY_CARE_PROVIDER_SITE_OTHER): Payer: Medicare HMO | Admitting: Family Medicine

## 2019-09-03 ENCOUNTER — Other Ambulatory Visit: Payer: Self-pay

## 2019-09-03 VITALS — BP 116/60 | HR 74 | Temp 97.9°F | Resp 14 | Ht 63.0 in | Wt 162.0 lb

## 2019-09-03 DIAGNOSIS — R252 Cramp and spasm: Secondary | ICD-10-CM | POA: Diagnosis not present

## 2019-09-03 DIAGNOSIS — D649 Anemia, unspecified: Secondary | ICD-10-CM

## 2019-09-03 DIAGNOSIS — I10 Essential (primary) hypertension: Secondary | ICD-10-CM

## 2019-09-03 DIAGNOSIS — J309 Allergic rhinitis, unspecified: Secondary | ICD-10-CM

## 2019-09-03 DIAGNOSIS — E039 Hypothyroidism, unspecified: Secondary | ICD-10-CM

## 2019-09-03 DIAGNOSIS — E782 Mixed hyperlipidemia: Secondary | ICD-10-CM | POA: Diagnosis not present

## 2019-09-03 DIAGNOSIS — Z5181 Encounter for therapeutic drug level monitoring: Secondary | ICD-10-CM

## 2019-09-03 DIAGNOSIS — N1831 Chronic kidney disease, stage 3a: Secondary | ICD-10-CM | POA: Diagnosis not present

## 2019-09-03 DIAGNOSIS — E559 Vitamin D deficiency, unspecified: Secondary | ICD-10-CM | POA: Diagnosis not present

## 2019-09-03 DIAGNOSIS — D631 Anemia in chronic kidney disease: Secondary | ICD-10-CM | POA: Insufficient documentation

## 2019-09-03 MED ORDER — FLUTICASONE PROPIONATE 50 MCG/ACT NA SUSP: 2.0000 | Freq: Every day | NASAL | 2 refills | Status: DC

## 2019-09-03 MED ORDER — MONTELUKAST SODIUM 10 MG PO TABS: 10.0000 mg | ORAL_TABLET | Freq: Every day | ORAL | 3 refills | Status: DC

## 2019-09-03 MED ORDER — LOVASTATIN 20 MG PO TABS: 20.0000 mg | ORAL_TABLET | Freq: Every day | ORAL | 3 refills | Status: DC

## 2019-09-03 NOTE — Patient Instructions (Signed)
Can try taking a B-complex vitamin daily and calcium, magnesium and zinc supplement Stay hydrated and try bananas or other potassium rich foods if your muscle spasms get worse.  Look below at dosing - I usually have females over 50 or 60 take 1200 calcium and 1000 IU Vit D 3   Preventing Osteoporosis, Adult Osteoporosis is a condition that causes the bones to lose density. This means that the bones become thinner, and the normal spaces in bone tissue become larger. Low bone density can make the bones weak and cause them to break more easily. Osteoporosis cannot always be prevented, but you can take steps to lower your risk of developing this condition. How can this condition affect me? If you develop osteoporosis, you will be more likely to break bones in your wrist, spine, or hip. Even a minor accident or injury can be enough to break weak bones. The bones will also be slower to heal. Osteoporosis can cause other problems as well, such as a stooped posture or trouble with movement. Osteoporosis can occur with aging. As you get older, you may lose bone tissue more quickly, or it may be replaced more slowly. Osteoporosis is more likely to develop if you have poor nutrition or do not get enough calcium or vitamin D. Other lifestyle factors can also play a role. By eating a well-balanced diet and making lifestyle changes, you can help keep your bones strong and healthy, lowering your chances of developing osteoporosis. What can increase my risk? The following factors may make you more likely to develop osteoporosis:  Having a family history of the condition.  Having poor nutrition or not getting enough calcium or vitamin D.  Using certain medicines, such as steroid medicines or antiseizure medicines.  Being any of the following: ? 86 years of age or older. ? Female. ? A woman who has gone through menopause (is postmenopausal). ? White (Caucasian) or of Asian descent.  Smoking or having a  history of smoking.  Not being physically active (being sedentary).  Having a small body frame. What actions can I take to prevent this?  Get enough calcium   Make sure you get enough calcium every day. Calcium is the most important mineral for bone health. Most people can get enough calcium from their diet, but supplements may be recommended for people who are at risk for osteoporosis. Follow these guidelines: ? If you are age 41 or younger, aim to get 1,000 mg of calcium every day. ? If you are older than age 30, aim to get 1,200 mg of calcium every day.  Good sources of calcium include: ? Dairy products, such as low-fat or nonfat milk, cheese, and yogurt. ? Dark green leafy vegetables, such as bok choy and broccoli. ? Foods that have had calcium added to them (calcium-fortified foods), such as orange juice, cereal, bread, soy beverages, and tofu products. ? Nuts, such as almonds.  Check nutrition labels to see how much calcium is in a food or drink. Get enough vitamin D  Try to get enough vitamin D every day. Vitamin D is the most essential vitamin for bone health. It helps the body absorb calcium. Follow these guidelines for how much vitamin D to get from food: ? If you are age 19 or younger, aim to get at least 600 international units (IU) every day. Your health care provider may suggest more. ? If you are older than age 34, aim to get at least 800 international units every day.  Your health care provider may suggest more.  Good sources of vitamin D in your diet include: ? Egg yolks. ? Oily fish, such as salmon, sardines, and tuna. ? Milk and cereal fortified with vitamin D.  Your body also makes vitamin D when you are out in the sun. Exposing the bare skin on your face, arms, legs, or back to the sun for no more than 30 minutes a day, 2 times a week is more than enough. Beyond that, make sure you use sunblock to protect your skin from sunburn, which increases your risk for skin  cancer. Exercise  Stay active and get exercise every day.  Ask your health care provider what types of exercise are best for you. Weight-bearing and strength-building activities are important for building and maintaining healthy bones. Some examples of these types of activities include: ? Walking and hiking. ? Jogging and running. ? Dancing. ? Gym exercises. ? Lifting weights. ? Tennis and racquetball. ? Climbing stairs. ? Aerobics. Make other lifestyle changes  Do not use any products that contain nicotine or tobacco, such as cigarettes, e-cigarettes, and chewing tobacco. If you need help quitting, ask your health care provider.  Lose weight if you are overweight.  If you drink alcohol: ? Limit how much you use to:  0-1 drink a day for nonpregnant women.  0-2 drinks a day for men. ? Be aware of how much alcohol is in your drink. In the [place] one drink equals one 12 oz bottle of beer (355 mL), one 5 oz glass of wine (148 mL), or one 1 oz glass of hard liquor (44 mL). Where to find support If you need help making changes to prevent osteoporosis, talk with your health care provider. You can ask for a referral to a diet and nutrition specialist (dietitian) and a physical therapist. Where to find more information Learn more about osteoporosis from:  NIH Osteoporosis and Related [place]: www.bones.SouthExposed.es  [place] Office on [employer] Health: [place].tn  [place]: EquipmentWeekly.com.ee Summary  Osteoporosis is a condition that causes weak bones that are more likely to break.  Eat a healthy diet, making sure you get enough calcium and vitamin D, and stay active by getting regular exercise to help prevent osteoporosis.  Other ways to reduce your risk of osteoporosis include maintaining a healthy weight and avoiding alcohol and products that contain nicotine or tobacco. This information is not intended to replace advice given to  you by your health care provider. Make sure you discuss any questions you have with your health care provider. Document Revised: 11/30/2018 Document Reviewed: 11/30/2018 Elsevier Patient Education  [place].

## 2019-09-03 NOTE — Progress Notes (Signed)
Name: Pamela Hendrix   MRN: [id number]    DOB: July 31, 1945   Date:09/03/2019       Progress Note  Chief Complaint  Patient presents with  . Follow-up  . Hypertension  . Hypothyroidism  . Hyperlipidemia  . muscle cramps    feet and legs     Subjective:   Pamela Hendrix is a 74 y.o. female, presents to clinic for routine follow up on the conditions listed above.  Hyperlipidemia:  Current Medication Regimen:  Lovastatin 20 mg daily -continues to be compliant without concerns or side effects no myalgias Last Lipids: Lab Results  Component Value Date   CHOL 164 05/06/2019   HDL 59 05/06/2019   LDLCALC 84 05/06/2019   TRIG 115 05/06/2019   CHOLHDL 2.8 05/06/2019  - Current Diet:  No particular diet right now - Denies: Chest pain, shortness of breath, myalgias. - Risk factors for atherosclerosis: hypercholesterolemia and hypertension Labs were done at last appointment were normal will not be rechecking today  Hypertension:  Currently managed on losartan 50 mg daily, no change -BP has been stable and well controlled, reviewed VS, flowsheet and past visits Pt reports good med compliance and denies any SE.  No lightheadedness, hypotension, syncope. Blood pressure today is well controlled. BP Readings from Last 3 Encounters:  09/03/19 116/60  05/01/19 140/64  02/19/19 138/69  BP monitoring at home, no concerns, no highs or lows Pt denies CP, SOB, exertional sx, palpitation, Ha's, visual disturbances She has unchanged b/l LE edema Dietary efforts for BP?  Generally healthy and low salt   Hypothyroidism: Current Medication Regimen: 88 mcg daily synthroid - she did need a change in rx due to change in manufacturer, she was out of meds for 2-3 days Takes medicine  in the am prior to food and other meds Current Symptoms: denies fatigue, weight changes, heat/cold intolerance, bowel/skin changes or CVS symptoms Most recent results are below; we will be  repeating labs today. Lab Results  Component Value Date   TSH 0.77 01/16/2019   Vitamin D deficiency-supplementing 1000 IU daily, she was previously on higher doses but we rechecked at last appointment it was normal and she has continued to 1000 IU daily, she is not on any calcium supplement for osteoporosis prevention discussed at length today  Anemia with iron deficiency in the past.  Patient hemoglobin hematocrit level were checked at last visit she tends to run with low end of normal or slightly below normal level of hemoglobin last was 11.7 and ferritin and iron panel were normal while patient was not on any iron supplements.  She does note a past medical history of vitamin B-12 deficiency had even had injections to treat in the past, she is not currently on anything  Patient has noted some severe muscle spasms and charley horses in her legs and toes.  She believes this is happening because she is not able to walk like she used to since over the last 3 months has had knee pain and has been less able to get in her exercise.  She is not having any worsening symptoms at night she denies any paresthesias numbness or weakness   Patient Active Problem List   Diagnosis Date Noted  . Mild anemia 09/03/2019  . Vitamin D deficiency 09/03/2019  . Migraine aura without headache 01/01/2019  . Decreased GFR 01/01/2019  . CKD (chronic kidney disease) stage 3, GFR 30-59 ml/min 12/04/2018  . PFO (patent foramen ovale) 07/31/2018  .  Abnormal ECG 07/13/2018  . Bradycardia 07/13/2018  . Primary osteoarthritis of left knee 11/20/2017  . History of GI diverticular bleed 05/18/2017  . Vitamin B12 deficiency anemia 05/18/2017  . Chronic allergic rhinitis 06/28/2016  . Mixed hyperlipidemia 06/28/2016  . Hiatal hernia 08/28/2014  . Hypothyroid 08/28/2014  . Headache 10/25/2013  . Hypertension 10/25/2013  . Numbness and tingling 10/25/2013  . Obesity 10/25/2013  . Visual disturbance 10/25/2013  .  Thyroid nodule 09/19/2013  . Onychomycosis due to dermatophyte 08/21/2013    Past Surgical History:  Procedure Laterality Date  . CHOLECYSTECTOMY    . COLONOSCOPY  06/02/2014   cleared for 10 yrs- Dr Rayann Heman  . COLONOSCOPY N/A 04/07/2017   Procedure: COLONOSCOPY;  Surgeon: Lin Landsman, MD;  Location: [facility] ENDOSCOPY;  Service: Gastroenterology;  Laterality: N/A;  . ESOPHAGEAL DILATION    . ESOPHAGOGASTRODUODENOSCOPY N/A 04/07/2017   Procedure: ESOPHAGOGASTRODUODENOSCOPY (EGD);  Surgeon: Lin Landsman, MD;  Location: [facility] ENDOSCOPY;  Service: Gastroenterology;  Laterality: N/A;  . TONSILLECTOMY    . UPPER GI ENDOSCOPY  06/02/2014    Family History  Problem Relation Age of Onset  . Kidney disease Mother   . Cancer Mother        brain  . Heart disease Father   . Heart attack Father   . Lupus Sister   . Diabetes Sister   . Thyroid disease Sister   . Rheum arthritis Sister   . Diabetes Sister   . Multiple myeloma Sister   . Thyroid disease Sister   . Rheum arthritis Sister   . Breast cancer Maternal Aunt     Social History   Tobacco Use  . Smoking status: Never Smoker  . Smokeless tobacco: Never Used  . Tobacco comment: smoking cessation materials not required  Substance Use Topics  . Alcohol use: No    Alcohol/week: 0.0 standard drinks  . Drug use: No      Current Outpatient Medications:  .  aspirin EC 81 MG tablet, Take 81 mg by mouth daily., Disp: , Rfl:  .  cetirizine (ZYRTEC) 10 MG tablet, Take 10 mg by mouth daily. otc, Disp: , Rfl:  .  Cholecalciferol (VITAMIN D-3) 1000 units CAPS, Take 1 capsule by mouth daily., Disp: , Rfl:  .  fluticasone (FLONASE) 50 MCG/ACT nasal spray, Place 2 sprays into both nostrils daily. PRN, Disp: 16 g, Rfl: 2 .  levothyroxine (SYNTHROID) 88 MCG tablet, Take 1 tablet (88 mcg total) by mouth daily., Disp: 90 tablet, Rfl: 3 .  losartan (COZAAR) 50 MG tablet, Take 1 tablet by mouth daily., Disp: , Rfl:  .  lovastatin  (MEVACOR) 20 MG tablet, Take 1 tablet (20 mg total) by mouth daily., Disp: 90 tablet, Rfl: 3 .  montelukast (SINGULAIR) 10 MG tablet, Take 1 tablet (10 mg total) by mouth daily., Disp: 90 tablet, Rfl: 3 .  polyethylene glycol (MIRALAX / GLYCOLAX) 17 g packet, Take 17 g by mouth daily., Disp: , Rfl:   Allergies  Allergen Reactions  . Lisinopril     cough  . Codeine Nausea Only and Nausea And Vomiting    Chart Review Today: I personally reviewed active problem list, medication list, allergies, family history, social history, health maintenance, notes from last encounter, lab results, imaging with the patient/caregiver today.   Review of Systems  10 Systems reviewed and are negative for acute change except as noted in the HPI.   Objective:    Vitals:   09/03/19 1350  BP: 116/60  Pulse: 74  Resp: 14  Temp: 97.9 F (36.6 C)  SpO2: 97%  Weight: 162 lb (73.5 kg)  Height: '5\' 3"'$  (1.6 m)    Body mass index is 28.7 kg/m.  Physical Exam Vitals and nursing note reviewed.  Constitutional:      General: She is not in acute distress.    Appearance: Normal appearance. She is well-developed and normal weight. She is not ill-appearing, toxic-appearing or diaphoretic.     Interventions: Face mask in place.  HENT:     Head: Normocephalic and atraumatic.     Right Ear: External ear normal.     Left Ear: External ear normal.  Eyes:     General: Lids are normal. No scleral icterus.       Right eye: No discharge.        Left eye: No discharge.     Conjunctiva/sclera: Conjunctivae normal.  Neck:     Trachea: Phonation normal. No tracheal deviation.  Cardiovascular:     Rate and Rhythm: Normal rate and regular rhythm.     Pulses: Normal pulses.          Radial pulses are 2+ on the right side and 2+ on the left side.       Posterior tibial pulses are 2+ on the right side and 2+ on the left side.     Heart sounds: Normal heart sounds. No murmur. No friction rub. No gallop.       Comments: Symmetrical 1+ pitting pretibial edema Pulmonary:     Effort: Pulmonary effort is normal. No respiratory distress.     Breath sounds: Normal breath sounds. No stridor. No wheezing, rhonchi or rales.  Chest:     Chest wall: No tenderness.  Abdominal:     General: Bowel sounds are normal. There is no distension.     Palpations: Abdomen is soft.     Tenderness: There is no abdominal tenderness. There is no guarding or rebound.  Musculoskeletal:     Cervical back: Normal range of motion and neck supple.     Right lower leg: Edema present.     Left lower leg: Edema present.  Lymphadenopathy:     Cervical: No cervical adenopathy.  Skin:    General: Skin is warm and dry.     Capillary Refill: Capillary refill takes less than 2 seconds.     Coloration: Skin is not jaundiced or pale.     Findings: No rash.  Neurological:     Mental Status: She is alert and oriented to person, place, and time.     Motor: No abnormal muscle tone.     Gait: Gait abnormal.  Psychiatric:        Speech: Speech normal.        Behavior: Behavior normal.      PHQ2/9: Depression screen [facility] 2/9 09/03/2019 05/01/2019 02/19/2019 01/01/2019 10/19/2018  Decreased Interest 0 0 0 1 0  Down, Depressed, Hopeless 0 2 0 1 0  PHQ - 2 Score 0 2 0 2 0  Altered sleeping 0 0 - 0 0  Tired, decreased energy 0 0 - 1 0  Change in appetite 0 0 - 1 0  Feeling bad or failure about yourself  0 0 - 0 0  Trouble concentrating 0 0 - 0 0  Moving slowly or fidgety/restless 0 0 - 0 0  Suicidal thoughts 0 0 - 0 0  PHQ-9 Score 0 2 - 4 0  Difficult doing work/chores Not difficult at  all Not difficult at all - Not difficult at all Not difficult at all  Some recent data might be hidden    phq 9 is neg, reviewed today   Fall Risk: Fall Risk  09/03/2019 05/01/2019 02/19/2019 01/01/2019 10/19/2018  Falls in the past year? 0 0 0 0 0  Comment - - - - -  Number falls in past yr: 0 0 0 0 0  Injury with Fall? 0 0 0 0 0  Risk for fall due  to : - - - - -  Risk for fall due to: Comment - - - - -  Follow up - - Falls prevention discussed - -    Functional Status Survey: Is the patient deaf or have difficulty hearing?: No Does the patient have difficulty seeing, even when wearing glasses/contacts?: No Does the patient have difficulty concentrating, remembering, or making decisions?: No Does the patient have difficulty walking or climbing stairs?: No Does the patient have difficulty dressing or bathing?: No Does the patient have difficulty doing errands alone such as visiting a doctor's office or shopping?: No   Assessment & Plan:     ICD-10-CM   1. Hypothyroidism, unspecified type  E03.9 TSH   Last 6 months TSH was low and then returned to normal range recent change in manufacturer recheck today, no symptoms of chemical hypothyroid  2. Stage 3a chronic kidney disease  P82.42 BASIC METABOLIC PANEL WITH GFR   Monitoring labs, decrease in her blood pressure today but still within normal range will recheck kidney function  3. Essential hypertension  P53 BASIC METABOLIC PANEL WITH GFR   Stable, well controlled with losartan 50 mg slight decrease in her blood pressure relative to last office visits but no symptoms -not lightheaded no syncope  4. Mixed hyperlipidemia  E78.2 lovastatin (MEVACOR) 20 MG tablet   Well-controlled at last visit compliant with meds, meds refilled, no myalgias or side effects  5. Mild anemia  D64.9    H/H runs low end of normal, stable, normal iron panel  6. Vitamin D deficiency  E55.9    last labs good, >30, continue same daily supplement  7. Encounter for medication monitoring  Z51.81 TSH    BASIC METABOLIC PANEL WITH GFR  8. Muscle cramps  I14.4 BASIC METABOLIC PANEL WITH GFR    Magnesium   Discussed hydration, potassium rich diet, possibly supplementing with B- complex and low-dose magnesium, will check mag with BMP today  9. Chronic allergic rhinitis  J30.9 montelukast (SINGULAIR) 10 MG tablet     fluticasone (FLONASE) 50 MCG/ACT nasal spray   Refill on her allergy medication, Flonase and Singulair no concerns or side effects      Return for 6 month f/up HLD, HTN, thyroid .   Delsa Grana, PA-C 09/03/19 2:50 PM

## 2019-09-04 DIAGNOSIS — G8929 Other chronic pain: Secondary | ICD-10-CM | POA: Diagnosis not present

## 2019-09-04 DIAGNOSIS — M25561 Pain in right knee: Secondary | ICD-10-CM | POA: Diagnosis not present

## 2019-09-04 DIAGNOSIS — M6281 Muscle weakness (generalized): Secondary | ICD-10-CM | POA: Diagnosis not present

## 2019-09-04 LAB — BASIC METABOLIC PANEL WITH GFR
BUN/Creatinine Ratio: 17 (calc) (ref 6–22)
BUN: 20 mg/dL (ref 7–25)
CO2: 28 mmol/L (ref 20–32)
Calcium: 9.1 mg/dL (ref 8.6–10.4)
Chloride: 103 mmol/L (ref 98–110)
Creat: 1.15 mg/dL — ABNORMAL HIGH (ref 0.60–0.93)
GFR, Est African American: 55 mL/min/{1.73_m2} — ABNORMAL LOW (ref >=60)
GFR, Est Non African American: 47 mL/min/{1.73_m2} — ABNORMAL LOW (ref >=60)
Glucose, Bld: 112 mg/dL — ABNORMAL HIGH (ref 65–99)
Potassium: 4.6 mmol/L (ref 3.5–5.3)
Sodium: 138 mmol/L (ref 135–146)

## 2019-09-04 LAB — MAGNESIUM: Magnesium: 2.2 mg/dL (ref 1.5–2.5)

## 2019-09-04 LAB — TSH: TSH: 0.89 mIU/L (ref 0.40–4.50)

## 2019-10-09 DIAGNOSIS — H2513 Age-related nuclear cataract, bilateral: Secondary | ICD-10-CM | POA: Diagnosis not present

## 2019-10-11 DIAGNOSIS — Z0100 Encounter for examination of eyes and vision without abnormal findings: Secondary | ICD-10-CM | POA: Diagnosis not present

## 2019-11-05 ENCOUNTER — Telehealth: Payer: Self-pay | Admitting: Family Medicine

## 2019-11-05 NOTE — Chronic Care Management (AMB) (Signed)
  Chronic Care Management   Note  11/05/2019 Name: Pamela Hendrix MRN: [id number] DOB: Oct 25, 1945  Pamela Hendrix is a 74 y.o. year old female who is a primary care patient of Delsa Grana, [place]. I reached out to [name] by phone today in response to a referral sent by Pamela Hendrix's health plan.     Ms. Schubring was given information about Chronic Care Management services today including:  1. CCM service includes personalized support from designated clinical staff supervised by her physician, including individualized plan of care and coordination with other care providers 2. 24/7 contact phone numbers for assistance for urgent and routine care needs. 3. Service will only be billed when office clinical staff spend 20 minutes or more in a month to coordinate care. 4. Only one practitioner may furnish and bill the service in a calendar month. 5. The patient may stop CCM services at any time (effective at the end of the month) by phone call to the office staff. 6. The patient will be responsible for cost sharing (co-pay) of up to 20% of the service fee (after annual deductible is met).  Patient did not agree to enrollment in care management services and does not wish to consider at this time.  Follow up plan: The care management team is available to follow up with the patient after provider conversation with the patient regarding recommendation for care management engagement and subsequent re-referral to the care management team.   Noreene Larsson, [place], [place], [place] [address] Direct Dial: [phone] [email or web address] Website: [email or web address]

## 2019-12-06 DIAGNOSIS — D2261 Melanocytic nevi of right upper limb, including shoulder: Secondary | ICD-10-CM | POA: Diagnosis not present

## 2019-12-06 DIAGNOSIS — D2272 Melanocytic nevi of left lower limb, including hip: Secondary | ICD-10-CM | POA: Diagnosis not present

## 2019-12-06 DIAGNOSIS — D2262 Melanocytic nevi of left upper limb, including shoulder: Secondary | ICD-10-CM | POA: Diagnosis not present

## 2019-12-06 DIAGNOSIS — D2271 Melanocytic nevi of right lower limb, including hip: Secondary | ICD-10-CM | POA: Diagnosis not present

## 2019-12-06 DIAGNOSIS — L821 Other seborrheic keratosis: Secondary | ICD-10-CM | POA: Diagnosis not present

## 2019-12-06 DIAGNOSIS — D225 Melanocytic nevi of trunk: Secondary | ICD-10-CM | POA: Diagnosis not present

## 2019-12-12 DIAGNOSIS — N1831 Chronic kidney disease, stage 3a: Secondary | ICD-10-CM | POA: Diagnosis not present

## 2019-12-12 DIAGNOSIS — I10 Essential (primary) hypertension: Secondary | ICD-10-CM | POA: Diagnosis not present

## 2019-12-12 DIAGNOSIS — E782 Mixed hyperlipidemia: Secondary | ICD-10-CM | POA: Diagnosis not present

## 2020-01-06 ENCOUNTER — Telehealth (HOSPITAL_COMMUNITY): Payer: Self-pay | Admitting: Vascular Surgery

## 2020-01-06 NOTE — Telephone Encounter (Signed)
Left pt VM, giving new chf appt w/ [facility], 914/21 @ 11:40 am, asked pt to call back to confirm appt

## 2020-01-28 ENCOUNTER — Encounter (HOSPITAL_COMMUNITY): Payer: Medicare HMO | Admitting: Cardiology

## 2020-02-03 DIAGNOSIS — R69 Illness, unspecified: Secondary | ICD-10-CM | POA: Diagnosis not present

## 2020-02-19 NOTE — Progress Notes (Signed)
Name: Pamela Hendrix   MRN: [id number]    DOB: 1945-08-25   Date:02/20/2020       Progress Note  Chief Complaint  Patient presents with  . Hypertension  . Hyperlipidemia     Subjective:   Pamela Hendrix is a 74 y.o. female, presents to clinic for routine f/up and labs  Hyperlipidemia: Currently treated with lovastatin 6m qd, pt reports good med compliance Last Lipids: Lab Results  Component Value Date   CHOL 164 05/06/2019   HDL 59 05/06/2019   LDLCALC 84 05/06/2019   TRIG 115 05/06/2019   CHOLHDL 2.8 05/06/2019   - Denies: Chest pain, shortness of breath, myalgias, claudication  Hypertension:  Currently managed on losartan 50 mg qd -she saw her cardiologist, Dr. KNehemiah Massedin July and this medication was increased to 100 mg however she is not sure if she is taking 50 or 100 Pt reports good med compliance and denies any SE.   Blood pressure today is well controlled. BP Readings from Last 3 Encounters:  02/20/20 130/70  02/20/20 130/70  09/03/19 116/60   Pt denies CP, SOB, exertional sx, LE edema, palpitation, Ha's, visual disturbances, lightheadedness, hypotension, syncope. She is not doing a low-salt diet and admits to eating more than she should and more often.  TSH - on 88 mcg gained some weight -which she attributes to diet and nutrition and also to Covid weight  We discussed shingles shot, she is willing to get this and follow-up at her pharmacy but she did just get a Covid booster and a flu shot    Current Outpatient Medications:  .  aspirin EC 81 MG tablet, Take 81 mg by mouth daily., Disp: , Rfl:  .  cetirizine (ZYRTEC) 10 MG tablet, Take 10 mg by mouth daily. otc, Disp: , Rfl:  .  Cholecalciferol (VITAMIN D-3) 1000 units CAPS, Take 1 capsule by mouth daily., Disp: , Rfl:  .  clobetasol ointment (TEMOVATE) 0.05 %, Apply topically 2 (two) times daily as needed., Disp: , Rfl:  .  fluticasone (FLONASE) 50 MCG/ACT nasal spray, Place 2  sprays into both nostrils daily. PRN, Disp: 16 g, Rfl: 2 .  levothyroxine (SYNTHROID) 88 MCG tablet, Take 1 tablet (88 mcg total) by mouth daily., Disp: 90 tablet, Rfl: 3 .  losartan (COZAAR) 50 MG tablet, Take 1 tablet by mouth daily., Disp: , Rfl:  .  lovastatin (MEVACOR) 20 MG tablet, Take 1 tablet (20 mg total) by mouth daily., Disp: 90 tablet, Rfl: 3 .  montelukast (SINGULAIR) 10 MG tablet, Take 1 tablet (10 mg total) by mouth daily., Disp: 90 tablet, Rfl: 3 .  polyethylene glycol (MIRALAX / GLYCOLAX) 17 g packet, Take 17 g by mouth daily., Disp: , Rfl:   Patient Active Problem List   Diagnosis Date Noted  . Mild anemia 09/03/2019  . Vitamin D deficiency 09/03/2019  . Migraine aura without headache 01/01/2019  . Decreased GFR 01/01/2019  . CKD (chronic kidney disease) stage 3, GFR 30-59 ml/min (HCC) 12/04/2018  . PFO (patent foramen ovale) 07/31/2018  . Abnormal ECG 07/13/2018  . Bradycardia 07/13/2018  . Primary osteoarthritis of left knee 11/20/2017  . History of GI diverticular bleed 05/18/2017  . Vitamin B12 deficiency anemia 05/18/2017  . Chronic allergic rhinitis 06/28/2016  . Mixed hyperlipidemia 06/28/2016  . Hiatal hernia 08/28/2014  . Hypothyroid 08/28/2014  . Headache 10/25/2013  . Hypertension 10/25/2013  . Numbness and tingling 10/25/2013  . Obesity 10/25/2013  . Visual disturbance  10/25/2013  . Thyroid nodule 09/19/2013  . Onychomycosis due to dermatophyte 08/21/2013    Past Surgical History:  Procedure Laterality Date  . CHOLECYSTECTOMY    . COLONOSCOPY  06/02/2014   cleared for 10 yrs- Dr Rayann Heman  . COLONOSCOPY N/A 04/07/2017   Procedure: COLONOSCOPY;  Surgeon: Lin Landsman, MD;  Location: [facility] ENDOSCOPY;  Service: Gastroenterology;  Laterality: N/A;  . ESOPHAGEAL DILATION    . ESOPHAGOGASTRODUODENOSCOPY N/A 04/07/2017   Procedure: ESOPHAGOGASTRODUODENOSCOPY (EGD);  Surgeon: Lin Landsman, MD;  Location: [facility] ENDOSCOPY;  Service:  Gastroenterology;  Laterality: N/A;  . HERNIA REPAIR  08/28/2014   estimate  . TONSILLECTOMY    . TUBAL LIGATION  1975   estimate  . UPPER GI ENDOSCOPY  06/02/2014    Family History  Problem Relation Age of Onset  . Kidney disease Mother   . Cancer Mother        brain  . Arthritis Mother   . Obesity Mother   . Varicose Veins Mother   . Heart disease Father   . Heart attack Father   . Hearing loss Father   . Hypertension Father   . Lupus Sister   . Diabetes Sister   . Thyroid disease Sister   . Rheum arthritis Sister   . Arthritis Sister   . Obesity Sister   . Diabetes Sister   . Arthritis Sister   . Cancer Sister   . Hearing loss Sister   . Intellectual disability Sister   . Learning disabilities Sister   . Multiple myeloma Sister   . Thyroid disease Sister   . Rheum arthritis Sister   . Arthritis Sister   . Cancer Sister   . Obesity Sister   . Breast cancer Maternal Aunt   . Miscarriages / [place] Daughter     Social History   Tobacco Use  . Smoking status: Never Smoker  . Smokeless tobacco: Never Used  . Tobacco comment: Never, but father and husband smoked  Vaping Use  . Vaping Use: Never used  Substance Use Topics  . Alcohol use: Not Currently    Alcohol/week: 0.0 standard drinks    Comment: on special occasions like New Years Eve  . Drug use: Never     Allergies  Allergen Reactions  . Lisinopril     cough  . Codeine Nausea Only and Nausea And Vomiting    Health Maintenance  Topic Date Due  . MAMMOGRAM  01/30/2020  . TETANUS/TDAP  10/13/2020  . COLONOSCOPY  04/07/2022  . INFLUENZA VACCINE  Completed  . DEXA SCAN  Completed  . COVID-19 Vaccine  Completed  . Hepatitis C Screening  Completed  . PNA vac Low Risk Adult  Completed    Chart Review Today: I personally reviewed active problem list, medication list, allergies, family history, social history, health maintenance, notes from last encounter, lab results, imaging with the  patient/caregiver today.   Review of Systems  10 Systems reviewed and are negative for acute change except as noted in the HPI.  Objective:   Vitals:   02/20/20 1424  BP: 130/70  Pulse: 67  Resp: 16  Temp: 98.4 F (36.9 C)  TempSrc: Oral  SpO2: 99%  Weight: 164 lb 3.2 oz (74.5 kg)  Height: 5' (1.524 m)    Body mass index is 32.07 kg/m.  Physical Exam Vitals and nursing note reviewed.  Constitutional:      General: She is not in acute distress.    Appearance: Normal appearance. She  is well-developed. She is obese. She is not ill-appearing, toxic-appearing or diaphoretic.     Interventions: Face mask in place.  HENT:     Head: Normocephalic and atraumatic.     Right Ear: External ear normal.     Left Ear: External ear normal.  Eyes:     General: Lids are normal. No scleral icterus.       Right eye: No discharge.        Left eye: No discharge.     Conjunctiva/sclera: Conjunctivae normal.  Neck:     Trachea: Phonation normal. No tracheal deviation.  Cardiovascular:     Rate and Rhythm: Normal rate and regular rhythm.     Pulses: Normal pulses.          Radial pulses are 2+ on the right side and 2+ on the left side.       Posterior tibial pulses are 2+ on the right side and 2+ on the left side.     Heart sounds: Normal heart sounds. No murmur heard.  No friction rub. No gallop.   Pulmonary:     Effort: Pulmonary effort is normal. No respiratory distress.     Breath sounds: Normal breath sounds. No stridor. No wheezing, rhonchi or rales.  Chest:     Chest wall: No tenderness.  Abdominal:     General: Bowel sounds are normal. There is no distension.     Palpations: Abdomen is soft.  Musculoskeletal:     Right lower leg: No edema.     Left lower leg: No edema.  Skin:    General: Skin is warm and dry.     Coloration: Skin is not jaundiced or pale.     Findings: No rash.  Neurological:     Mental Status: She is alert.     Motor: No abnormal muscle tone.      Gait: Gait normal.  Psychiatric:        Mood and Affect: Mood normal.        Speech: Speech normal.        Behavior: Behavior normal.         Assessment & Plan:     ICD-10-CM   1. Hypothyroidism, unspecified type  E03.9 CBC with Differential/Platelet    TSH   Some weight gain, likely from diet, will recheck TSH today, adjust levothyroxine dose pending lab results  2. Stage 3a chronic kidney disease (HCC)  R67.89 COMPLETE METABOLIC PANEL WITH GFR   Reviewed her blood work she has had GFR from 45 to <60 for 8 to 9 years,  reviewed recommendations for management and monitoring  3. Essential hypertension  F81 COMPLETE METABOLIC PANEL WITH GFR   Stable, well-controlled, blood pressure at goal today, per cardiology increase losartan to 100 mg daily medications were prescribed by cardiology in July  4. Mixed hyperlipidemia  E78.2 Lipid panel    COMPLETE METABOLIC PANEL WITH GFR   Compliant with statin, no myalgias concerns or side effects, work on healthy diet and lifestyle  5. Encounter for medication monitoring  Z51.81 Lipid panel    COMPLETE METABOLIC PANEL WITH GFR    CBC with Differential/Platelet    TSH  6. Need for shingles vaccine  Z23 Zoster Vaccine Adjuvanted [facility]) injection     Return in about 6 months (around 08/20/2020) for Routine follow-up.   Delsa Grana, PA-C 02/20/20 2:50 PM

## 2020-02-20 ENCOUNTER — Ambulatory Visit (INDEPENDENT_AMBULATORY_CARE_PROVIDER_SITE_OTHER): Payer: Medicare HMO

## 2020-02-20 ENCOUNTER — Encounter: Payer: Self-pay | Admitting: Family Medicine

## 2020-02-20 ENCOUNTER — Other Ambulatory Visit: Payer: Self-pay

## 2020-02-20 ENCOUNTER — Ambulatory Visit (INDEPENDENT_AMBULATORY_CARE_PROVIDER_SITE_OTHER): Payer: Medicare HMO | Admitting: Family Medicine

## 2020-02-20 VITALS — BP 130/70 | HR 67 | Temp 98.4°F | Resp 16 | Ht 60.0 in | Wt 164.2 lb

## 2020-02-20 DIAGNOSIS — Z5181 Encounter for therapeutic drug level monitoring: Secondary | ICD-10-CM

## 2020-02-20 DIAGNOSIS — N1831 Chronic kidney disease, stage 3a: Secondary | ICD-10-CM | POA: Diagnosis not present

## 2020-02-20 DIAGNOSIS — N183 Chronic kidney disease, stage 3 unspecified: Secondary | ICD-10-CM | POA: Diagnosis not present

## 2020-02-20 DIAGNOSIS — Z1231 Encounter for screening mammogram for malignant neoplasm of breast: Secondary | ICD-10-CM | POA: Diagnosis not present

## 2020-02-20 DIAGNOSIS — Z23 Encounter for immunization: Secondary | ICD-10-CM | POA: Diagnosis not present

## 2020-02-20 DIAGNOSIS — Z0000 Encounter for general adult medical examination without abnormal findings: Secondary | ICD-10-CM

## 2020-02-20 DIAGNOSIS — E782 Mixed hyperlipidemia: Secondary | ICD-10-CM | POA: Diagnosis not present

## 2020-02-20 DIAGNOSIS — I10 Essential (primary) hypertension: Secondary | ICD-10-CM

## 2020-02-20 DIAGNOSIS — E039 Hypothyroidism, unspecified: Secondary | ICD-10-CM

## 2020-02-20 DIAGNOSIS — Z780 Asymptomatic menopausal state: Secondary | ICD-10-CM | POA: Diagnosis not present

## 2020-02-20 MED ORDER — ZOSTER VAC RECOMB ADJUVANTED 50 MCG/0.5ML IM SUSR: 0.5000 mL | Freq: Once | INTRAMUSCULAR | 1 refills | Status: AC

## 2020-02-20 NOTE — Patient Instructions (Signed)
Pamela Hendrix , Thank you for taking time to come for your Medicare Wellness Visit. I appreciate your ongoing commitment to your health goals. Please review the following plan we discussed and let me know if I can assist you in the future.   Screening recommendations/referrals: Colonoscopy: done 04/07/17 Mammogram: done 01/30/19. Please call [phone] to schedule your mammogram and bone density screening.  Bone Density: done 09/24/15 Recommended yearly ophthalmology/optometry visit for glaucoma screening and checkup Recommended yearly dental visit for hygiene and checkup  Vaccinations: Influenza vaccine: done today  Pneumococcal vaccine: done 03/05/12 Tdap vaccine: done 10/14/10 Shingles vaccine: Shingrix discussed. Please contact your pharmacy for coverage information.  Covid-19: done 07/08/19, 07/29/19 & 02/10/20  Advanced directives: Please bring a copy of your health care power of attorney and living will to the office at your convenience.  Conditions/risks identified: Recommend increasing physical activity  Next appointment: Follow up in one year for your annual wellness visit    Preventive Care 65 Years and Older, Female Preventive care refers to lifestyle choices and visits with your health care provider that can promote health and wellness. What does preventive care include?  A yearly physical exam. This is also called an annual well check.  Dental exams once or twice a year.  Routine eye exams. Ask your health care provider how often you should have your eyes checked.  Personal lifestyle choices, including:  Daily care of your teeth and gums.  Regular physical activity.  Eating a healthy diet.  Avoiding tobacco and drug use.  Limiting alcohol use.  Practicing safe sex.  Taking low-dose aspirin every day.  Taking vitamin and mineral supplements as recommended by your health care provider. What happens during an annual well check? The services and screenings  done by your health care provider during your annual well check will depend on your age, overall health, lifestyle risk factors, and family history of disease. Counseling  Your health care provider may ask you questions about your:  Alcohol use.  Tobacco use.  Drug use.  Emotional well-being.  Home and relationship well-being.  Sexual activity.  Eating habits.  History of falls.  Memory and ability to understand (cognition).  Work and work Statistician.  Reproductive health. Screening  You may have the following tests or measurements:  Height, weight, and BMI.  Blood pressure.  Lipid and cholesterol levels. These may be checked every 5 years, or more frequently if you are over 69 years old.  Skin check.  Lung cancer screening. You may have this screening every year starting at age 107 if you have a 30-pack-year history of smoking and currently smoke or have quit within the past 15 years.  Fecal occult blood test (FOBT) of the stool. You may have this test every year starting at age 50.  Flexible sigmoidoscopy or colonoscopy. You may have a sigmoidoscopy every 5 years or a colonoscopy every 10 years starting at age 10.  Hepatitis C blood test.  Hepatitis B blood test.  Sexually transmitted disease (STD) testing.  Diabetes screening. This is done by checking your blood sugar (glucose) after you have not eaten for a while (fasting). You may have this done every 1-3 years.  Bone density scan. This is done to screen for osteoporosis. You may have this done starting at age 8.  Mammogram. This may be done every 1-2 years. Talk to your health care provider about how often you should have regular mammograms. Talk with your health care provider about your test results, treatment  options, and if necessary, the need for more tests. Vaccines  Your health care provider may recommend certain vaccines, such as:  Influenza vaccine. This is recommended every year.  Tetanus,  diphtheria, and acellular pertussis (Tdap, Td) vaccine. You may need a Td booster every 10 years.  Zoster vaccine. You may need this after age 62.  Pneumococcal 13-valent conjugate (PCV13) vaccine. One dose is recommended after age 2.  Pneumococcal polysaccharide (PPSV23) vaccine. One dose is recommended after age 31. Talk to your health care provider about which screenings and vaccines you need and how often you need them. This information is not intended to replace advice given to you by your health care provider. Make sure you discuss any questions you have with your health care provider. Document Released: 05/29/2015 Document Revised: 01/20/2016 Document Reviewed: 03/03/2015 Elsevier Interactive Patient Education  2017 [place] Prevention in the Home Falls can cause injuries. They can happen to people of all ages. There are many things you can do to make your home safe and to help prevent falls. What can I do on the outside of my home?  Regularly fix the edges of walkways and driveways and fix any cracks.  Remove anything that might make you trip as you walk through a door, such as a raised step or threshold.  Trim any bushes or trees on the path to your home.  Use bright outdoor lighting.  Clear any walking paths of anything that might make someone trip, such as rocks or tools.  Regularly check to see if handrails are loose or broken. Make sure that both sides of any steps have handrails.  Any raised decks and porches should have guardrails on the edges.  Have any leaves, snow, or ice cleared regularly.  Use sand or salt on walking paths during winter.  Clean up any spills in your garage right away. This includes oil or grease spills. What can I do in the bathroom?  Use night lights.  Install grab bars by the toilet and in the tub and shower. Do not use towel bars as grab bars.  Use non-skid mats or decals in the tub or shower.  If you need to sit down in  the shower, use a plastic, non-slip stool.  Keep the floor dry. Clean up any water that spills on the floor as soon as it happens.  Remove soap buildup in the tub or shower regularly.  Attach bath mats securely with double-sided non-slip rug tape.  Do not have throw rugs and other things on the floor that can make you trip. What can I do in the bedroom?  Use night lights.  Make sure that you have a light by your bed that is easy to reach.  Do not use any sheets or blankets that are too big for your bed. They should not hang down onto the floor.  Have a firm chair that has side arms. You can use this for support while you get dressed.  Do not have throw rugs and other things on the floor that can make you trip. What can I do in the kitchen?  Clean up any spills right away.  Avoid walking on wet floors.  Keep items that you use a lot in easy-to-reach places.  If you need to reach something above you, use a strong step stool that has a grab bar.  Keep electrical cords out of the way.  Do not use floor polish or wax that makes floors slippery.  If you must use wax, use non-skid floor wax.  Do not have throw rugs and other things on the floor that can make you trip. What can I do with my stairs?  Do not leave any items on the stairs.  Make sure that there are handrails on both sides of the stairs and use them. Fix handrails that are broken or loose. Make sure that handrails are as long as the stairways.  Check any carpeting to make sure that it is firmly attached to the stairs. Fix any carpet that is loose or worn.  Avoid having throw rugs at the top or bottom of the stairs. If you do have throw rugs, attach them to the floor with carpet tape.  Make sure that you have a light switch at the top of the stairs and the bottom of the stairs. If you do not have them, ask someone to add them for you. What else can I do to help prevent falls?  Wear shoes that:  Do not have high  heels.  Have rubber bottoms.  Are comfortable and fit you well.  Are closed at the toe. Do not wear sandals.  If you use a stepladder:  Make sure that it is fully opened. Do not climb a closed stepladder.  Make sure that both sides of the stepladder are locked into place.  Ask someone to hold it for you, if possible.  Clearly mark and make sure that you can see:  Any grab bars or handrails.  First and last steps.  Where the edge of each step is.  Use tools that help you move around (mobility aids) if they are needed. These include:  Canes.  Walkers.  Scooters.  Crutches.  Turn on the lights when you go into a dark area. Replace any light bulbs as soon as they burn out.  Set up your furniture so you have a clear path. Avoid moving your furniture around.  If any of your floors are uneven, fix them.  If there are any pets around you, be aware of where they are.  Review your medicines with your doctor. Some medicines can make you feel dizzy. This can increase your chance of falling. Ask your doctor what other things that you can do to help prevent falls. This information is not intended to replace advice given to you by your health care provider. Make sure you discuss any questions you have with your health care provider. Document Released: 02/26/2009 Document Revised: 10/08/2015 Document Reviewed: 06/06/2014 Elsevier Interactive Patient Education  2017 [employer].

## 2020-02-20 NOTE — Progress Notes (Signed)
Subjective:   Pamela Hendrix is a 74 y.o. female who presents for Medicare Annual (Subsequent) preventive examination.  Review of Systems     Cardiac Risk Factors include: advanced age (>91men, >75 women);dyslipidemia;hypertension;obesity (BMI >30kg/m2)     Objective:    Today's Vitals   02/20/20 1345  BP: 130/70  Pulse: 67  Resp: 16  Temp: 98.4 F (36.9 C)  TempSrc: Oral  Weight: 164 lb 3.2 oz (74.5 kg)  Height: 5' (1.524 m)   Body mass index is 32.07 kg/m.  Advanced Directives 02/20/2020 02/19/2019 09/04/2017 04/06/2017 04/06/2017  Does Patient Have a Medical Advance Directive? Yes Yes Yes Yes Yes  Type of Paramedic of [place];Living will [place];Living will [place];Living will [place];Living will [place];Living will  Does patient want to make changes to medical advance directive? - - - No - Patient declined -  Copy of [place] in Chart? No - copy requested No - copy requested No - copy requested No - copy requested No - copy requested    Current Medications (verified) Outpatient Encounter Medications as of 02/20/2020  Medication Sig  . aspirin EC 81 MG tablet Take 81 mg by mouth daily.  . cetirizine (ZYRTEC) 10 MG tablet Take 10 mg by mouth daily. otc  . Cholecalciferol (VITAMIN D-3) 1000 units CAPS Take 1 capsule by mouth daily.  . clobetasol ointment (TEMOVATE) 0.05 % Apply topically 2 (two) times daily as needed.  . fluticasone (FLONASE) 50 MCG/ACT nasal spray Place 2 sprays into both nostrils daily. PRN  . levothyroxine (SYNTHROID) 88 MCG tablet Take 1 tablet (88 mcg total) by mouth daily.  Marland Kitchen lovastatin (MEVACOR) 20 MG tablet Take 1 tablet (20 mg total) by mouth daily.  . montelukast (SINGULAIR) 10 MG tablet Take 1 tablet (10 mg total) by mouth daily.  . polyethylene glycol (MIRALAX / GLYCOLAX) 17 g packet Take 17 g by mouth  daily.  Marland Kitchen losartan (COZAAR) 50 MG tablet Take 1 tablet by mouth daily.   No facility-administered encounter medications on file as of 02/20/2020.    Allergies (verified) Lisinopril and Codeine   History: Past Medical History:  Diagnosis Date  . Allergy   . Anemia 2000   estimate  . Blood transfusion without reported diagnosis 04/07/2017   estimate  . Bursitis   . CKD (chronic kidney disease) stage 3, GFR 30-59 ml/min (HCC)   . Diverticulosis   . GERD (gastroesophageal reflux disease)   . Hiatal hernia   . Hypertension   . Migraine with aura    states she doesn't have a headache, just the aura  . Mixed hyperlipidemia   . Osteoarthritis of knee   . Thyroid disease    Past Surgical History:  Procedure Laterality Date  . CHOLECYSTECTOMY    . COLONOSCOPY  06/02/2014   cleared for 10 yrs- Dr Rayann Heman  . COLONOSCOPY N/A 04/07/2017   Procedure: COLONOSCOPY;  Surgeon: Lin Landsman, MD;  Location[facility] ENDOSCOPY;  Service: Gastroenterology;  Laterality: N/A;  . ESOPHAGEAL DILATION    . ESOPHAGOGASTRODUODENOSCOPY N/A 04/07/2017   Procedure: ESOPHAGOGASTRODUODENOSCOPY (EGD);  Surgeon: Lin Landsman, MD;  Location: [facility] ENDOSCOPY;  Service: Gastroenterology;  Laterality: N/A;  . HERNIA REPAIR  08/28/2014   estimate  . TONSILLECTOMY    . TUBAL LIGATION  1975   estimate  . UPPER GI ENDOSCOPY  06/02/2014   Family History  Problem Relation Age of Onset  .  Kidney disease Mother   . Cancer Mother        brain  . Arthritis Mother   . Obesity Mother   . Varicose Veins Mother   . Heart disease Father   . Heart attack Father   . Hearing loss Father   . Hypertension Father   . Lupus Sister   . Diabetes Sister   . Thyroid disease Sister   . Rheum arthritis Sister   . Arthritis Sister   . Obesity Sister   . Diabetes Sister   . Arthritis Sister   . Cancer Sister   . Hearing loss Sister   . Intellectual disability Sister   . Learning disabilities Sister   . Multiple  myeloma Sister   . Thyroid disease Sister   . Rheum arthritis Sister   . Arthritis Sister   . Cancer Sister   . Obesity Sister   . Breast cancer Maternal Aunt   . Miscarriages / [place] Daughter    Social History   Socioeconomic History  . Marital status: Married    Spouse name: Doren Custard  . Number of children: 1  . Years of education: some college  . Highest education level: 12th grade  Occupational History  . Occupation: Retired  Tobacco Use  . Smoking status: Never Smoker  . Smokeless tobacco: Never Used  . Tobacco comment: Never, but father and husband smoked  Vaping Use  . Vaping Use: Never used  Substance and Sexual Activity  . Alcohol use: Not Currently    Alcohol/week: 0.0 standard drinks    Comment: on special occasions like New Years Eve  . Drug use: Never  . Sexual activity: Not Currently    Birth control/protection: Post-menopausal, Surgical    Comment: tubes tied in my 53's, now post-menopausal  Other Topics Concern  . Not on file  Social History Narrative  . Not on file   Social Determinants of Health   Financial Resource Strain: Low Risk   . Difficulty of Paying Living Expenses: Not hard at all  Food Insecurity: No Food Insecurity  . Worried About Charity fundraiser in the Last Year: Never true  . Ran Out of Food in the Last Year: Never true  Transportation Needs: No Transportation Needs  . Lack of Transportation (Medical): No  . Lack of Transportation (Non-Medical): No  Physical Activity: Inactive  . Days of Exercise per Week: 0 days  . Minutes of Exercise per Session: 0 min  Stress: No Stress Concern Present  . Feeling of Stress : Only a little  Social Connections: Moderately Integrated  . Frequency of Communication with Friends and Family: More than three times a week  . Frequency of Social Gatherings with Friends and Family: More than three times a week  . Attends Religious Services: More than 4 times per year  . Active Member of Clubs or  Organizations: No  . Attends Archivist Meetings: Never  . Marital Status: Married    Tobacco Counseling Counseling given: Not Answered Comment: Never, but father and husband smoked   Clinical Intake:  Pre-visit preparation completed: Yes  Pain : No/denies pain     BMI - recorded: 32.07 Nutritional Status: BMI > 30  Obese Nutritional Risks: None Diabetes: No  How often do you need to have someone help you when you read instructions, pamphlets, or other written materials from your doctor or pharmacy?: 1 - Never    Interpreter Needed?: No  Information entered by :: Clemetine Marker LPN  Activities of Daily Living In your present state of health, do you have any difficulty performing the following activities: 02/20/2020 09/03/2019  Hearing? N N  Comment declines hearing aids -  Vision? N N  Difficulty concentrating or making decisions? N N  Walking or climbing stairs? N N  Dressing or bathing? N N  Doing errands, shopping? N N  Preparing Food and eating ? N -  Using the Toilet? N -  In the past six months, have you accidently leaked urine? N -  Do you have problems with loss of bowel control? N -  Managing your Medications? N -  Managing your Finances? N -  Housekeeping or managing your Housekeeping? N -  Some recent data might be hidden    Patient Care Team: Delsa Grana, PA-C as PCP - General (Family Medicine) Gabriel Carina, Betsey Holiday, MD as Physician Assistant (Endocrinology) Corey Skains, MD as Consulting Physician (Cardiology) Lin Landsman, MD as Consulting Physician (Gastroenterology)  Indicate any recent Medical Services you may have received from other than [facility] providers in the past year (date may be approximate).     Assessment:   This is a routine wellness examination for [place].  Hearing/Vision screen  Hearing Screening   125Hz  250Hz  500Hz  1000Hz  2000Hz  3000Hz  4000Hz  6000Hz  8000Hz   Right ear:           Left ear:           Vision  Screening Comments: [facility] screenings [facility]  Dietary issues and exercise activities discussed: Current Exercise Habits: The patient does not participate in regular exercise at present, Exercise limited by: None identified  Goals    . DIET - INCREASE WATER INTAKE     Recommend to drink at least 6-8 8oz glasses of water per day.    . Increase physical activity     Recommend increasing physical activity to at least 3 days per week      Depression Screen [facility] 2/9 Scores 02/20/2020 09/03/2019 05/01/2019 02/19/2019 01/01/2019 10/19/2018 09/24/2018  PHQ - 2 Score 1 0 2 0 2 0 1  PHQ- 9 Score - 0 2 - 4 0 3    Fall Risk Fall Risk  02/20/2020 09/03/2019 05/01/2019 02/19/2019 01/01/2019  Falls in the past year? 0 0 0 0 0  Comment - - - - -  Number falls in past yr: 0 0 0 0 0  Injury with Fall? 0 0 0 0 0  Risk for fall due to : No Fall Risks - - - -  Risk for fall due to: Comment - - - - -  Follow up Falls prevention discussed - - Falls prevention discussed -    Any stairs in or around the home? Yes  If so, are there any without handrails? No  Home free of loose throw rugs in walkways, pet beds, electrical cords, etc? Yes  Adequate lighting in your home to reduce risk of falls? Yes   ASSISTIVE DEVICES UTILIZED TO PREVENT FALLS:  Life alert? No  Use of a cane, walker or w/c? No  Grab bars in the bathroom? No  Shower chair or bench in shower? Yes  Elevated toilet seat or a handicapped toilet? No   TIMED UP AND GO:  Was the test performed? Yes .  Length of time to ambulate 10 feet: 5 sec.   Gait steady and fast without use of assistive device  Cognitive Function:     6CIT Screen 02/20/2020 02/19/2019 09/04/2017  What Year? 0 points 0 points 0 points  What month? 0 points 0 points 0 points  What time? 0 points 0 points 0 points  Count back from 20 0 points 0 points 0 points  Months in reverse 0 points 0 points 0 points  Repeat phrase 2 points 0 points 0 points   Total Score 2 0 0    Immunizations Immunization History  Administered Date(s) Administered  . Fluad Quad(high Dose 65+) 02/16/2019, 02/20/2020  . H1N1 03/01/2017  . Influenza, High Dose Seasonal PF 03/01/2017, 01/22/2018  . Influenza,inj,Quad PF,6+ Mos 04/02/2015  . PFIZER SARS-COV-2 Vaccination 07/08/2019, 07/29/2019, 02/10/2020  . Pneumococcal Conjugate-13 06/19/2015  . Pneumococcal Polysaccharide-23 03/05/2012  . Tdap 10/14/2010  . Zoster 02/25/2011    TDAP status: Up to date   Flu Vaccine status: Completed at today's visit   Pneumococcal vaccine status: Up to date   Covid-19 vaccine status: Completed vaccines  Qualifies for Shingles Vaccine? Yes   Zostavax completed Yes   Shingrix Completed?: No.    Education has been provided regarding the importance of this vaccine. Patient has been advised to call insurance company to determine out of pocket expense if they have not yet received this vaccine. Advised may also receive vaccine at local pharmacy or Health Dept. Verbalized acceptance and understanding.  Screening Tests Health Maintenance  Topic Date Due  . MAMMOGRAM  01/30/2020  . TETANUS/TDAP  10/13/2020  . COLONOSCOPY  04/07/2022  . INFLUENZA VACCINE  Completed  . DEXA SCAN  Completed  . COVID-19 Vaccine  Completed  . Hepatitis C Screening  Completed  . PNA vac Low Risk Adult  Completed    Health Maintenance  Health Maintenance Due  Topic Date Due  . MAMMOGRAM  01/30/2020    Colorectal cancer screening: Completed 04/07/17. Repeat every 5 years   Mammogram status: Completed 01/30/19. Repeat every year. Ordered today  Bone Density status: Completed 09/24/15. Results reflect: Bone density results: OSTEOPENIA. Repeat every 2 years.  Ordered today   Lung Cancer Screening: (Low Dose CT Chest recommended if Age 25-80 years, 30 pack-year currently smoking OR have quit w/in 15years.) does not qualify.   Additional Screening:  Hepatitis C Screening: does qualify;  Completed 06/28/16  Vision Screening: Recommended annual ophthalmology exams for early detection of glaucoma and other disorders of the eye. Is the patient up to date with their annual eye exam?  Yes  Who is the provider or what is the name of the office in which the patient attends annual eye exams? [place] Screening: Recommended annual dental exams for proper oral hygiene  Community Resource Referral / Chronic Care Management: CRR required this visit?  No   CCM required this visit?  No      Plan:     I have personally reviewed and noted the following in the patient's chart:   . Medical and social history . Use of alcohol, tobacco or illicit drugs  . Current medications and supplements . Functional ability and status . Nutritional status . Physical activity . Advanced directives . List of other physicians . Hospitalizations, surgeries, and ER visits in previous 12 months . Vitals . Screenings to include cognitive, depression, and falls . Referrals and appointments  In addition, I have reviewed and discussed with patient certain preventive protocols, quality metrics, and best practice recommendations. A written personalized care plan for preventive services as well as general preventive health recommendations were provided to patient.     Clemetine Marker, LPN  02/20/2020   Nurse Notes: per Dr. Alveria Apley note from 12/12/19 pt was to increase lovastatin to 40 mg and losartan to 100 mg. Outside med reconciliation shows losartan 100 mg dispensed that day as well but pt is only taking lovastatin 20 mg and losartan 50 mg at this time. Pt also has appt with Delsa Grana PAC today.

## 2020-02-21 LAB — COMPLETE METABOLIC PANEL WITH GFR
AG Ratio: 1.3 (calc) (ref 1.0–2.5)
ALT: 10 U/L (ref 6–29)
AST: 17 U/L (ref 10–35)
Albumin: 4.1 g/dL (ref 3.6–5.1)
Alkaline phosphatase (APISO): 66 U/L (ref 37–153)
BUN/Creatinine Ratio: 11 (calc) (ref 6–22)
BUN: 13 mg/dL (ref 7–25)
CO2: 26 mmol/L (ref 20–32)
Calcium: 9.5 mg/dL (ref 8.6–10.4)
Chloride: 105 mmol/L (ref 98–110)
Creat: 1.23 mg/dL — ABNORMAL HIGH (ref 0.60–0.93)
GFR, Est African American: 50 mL/min/{1.73_m2} — ABNORMAL LOW (ref >=60)
GFR, Est Non African American: 43 mL/min/{1.73_m2} — ABNORMAL LOW (ref >=60)
Globulin: 3.2 g/dL (calc) (ref 1.9–3.7)
Glucose, Bld: 101 mg/dL — ABNORMAL HIGH (ref 65–99)
Potassium: 4.6 mmol/L (ref 3.5–5.3)
Sodium: 140 mmol/L (ref 135–146)
Total Bilirubin: 0.7 mg/dL (ref 0.2–1.2)
Total Protein: 7.3 g/dL (ref 6.1–8.1)

## 2020-02-21 LAB — CBC WITH DIFFERENTIAL/PLATELET
Absolute Monocytes: 630 cells/uL (ref 200–950)
Basophils Absolute: 54 cells/uL (ref 0–200)
Basophils Relative: 0.6 %
Eosinophils Absolute: 63 cells/uL (ref 15–500)
Eosinophils Relative: 0.7 %
HCT: 34.4 % — ABNORMAL LOW (ref 35.0–45.0)
Hemoglobin: 11.5 g/dL — ABNORMAL LOW (ref 11.7–15.5)
Lymphs Abs: 1521 cells/uL (ref 850–3900)
MCH: 32.6 pg (ref 27.0–33.0)
MCHC: 33.4 g/dL (ref 32.0–36.0)
MCV: 97.5 fL (ref 80.0–100.0)
MPV: 13.2 fL — ABNORMAL HIGH (ref 7.5–12.5)
Monocytes Relative: 7 %
Neutro Abs: 6732 cells/uL (ref 1500–7800)
Neutrophils Relative %: 74.8 %
Platelets: 151 10*3/uL (ref 140–400)
RBC: 3.53 10*6/uL — ABNORMAL LOW (ref 3.80–5.10)
RDW: 13 % (ref 11.0–15.0)
Total Lymphocyte: 16.9 %
WBC: 9 10*3/uL (ref 3.8–10.8)

## 2020-02-21 LAB — LIPID PANEL
Cholesterol: 154 mg/dL (ref <200)
HDL: 61 mg/dL (ref >=50)
LDL Cholesterol (Calc): 72 mg/dL (calc)
Non-HDL Cholesterol (Calc): 93 mg/dL (calc) (ref <130)
Total CHOL/HDL Ratio: 2.5 (calc) (ref <5.0)
Triglycerides: 128 mg/dL (ref <150)

## 2020-02-21 LAB — TSH: TSH: 0.7 mIU/L (ref 0.40–4.50)

## 2020-02-26 NOTE — Addendum Note (Signed)
Addended by: Delsa Grana on: 02/26/2020 12:54 PM   Modules accepted: Orders

## 2020-03-20 DIAGNOSIS — N183 Chronic kidney disease, stage 3 unspecified: Secondary | ICD-10-CM | POA: Diagnosis not present

## 2020-03-20 LAB — BASIC METABOLIC PANEL WITH GFR
BUN/Creatinine Ratio: 14 (calc) (ref 6–22)
BUN: 16 mg/dL (ref 7–25)
CO2: 30 mmol/L (ref 20–32)
Calcium: 9.4 mg/dL (ref 8.6–10.4)
Chloride: 105 mmol/L (ref 98–110)
Creat: 1.14 mg/dL — ABNORMAL HIGH (ref 0.60–0.93)
GFR, Est African American: 55 mL/min/{1.73_m2} — ABNORMAL LOW (ref >=60)
GFR, Est Non African American: 47 mL/min/{1.73_m2} — ABNORMAL LOW (ref >=60)
Glucose, Bld: 95 mg/dL (ref 65–99)
Potassium: 4.7 mmol/L (ref 3.5–5.3)
Sodium: 140 mmol/L (ref 135–146)

## 2020-03-30 ENCOUNTER — Ambulatory Visit (INDEPENDENT_AMBULATORY_CARE_PROVIDER_SITE_OTHER): Payer: Medicare HMO | Admitting: Family Medicine

## 2020-03-30 ENCOUNTER — Other Ambulatory Visit: Payer: Self-pay

## 2020-03-30 VITALS — BP 120/60 | HR 61 | Temp 98.5°F | Resp 16 | Ht 60.0 in | Wt 161.4 lb

## 2020-03-30 DIAGNOSIS — Z9889 Other specified postprocedural states: Secondary | ICD-10-CM

## 2020-03-30 DIAGNOSIS — K5900 Constipation, unspecified: Secondary | ICD-10-CM | POA: Diagnosis not present

## 2020-03-30 DIAGNOSIS — K649 Unspecified hemorrhoids: Secondary | ICD-10-CM | POA: Diagnosis not present

## 2020-03-30 DIAGNOSIS — Z8719 Personal history of other diseases of the digestive system: Secondary | ICD-10-CM

## 2020-03-30 DIAGNOSIS — R109 Unspecified abdominal pain: Secondary | ICD-10-CM | POA: Diagnosis not present

## 2020-03-30 NOTE — Patient Instructions (Signed)
Go across the street to get a screening X-ray  You should hear from GI in the next couple weeks.   Abdominal Pain, Adult Many things can cause belly (abdominal) pain. Most times, belly pain is not dangerous. Many cases of belly pain can be watched and treated at home. Sometimes, though, belly pain is serious. Your doctor will try to find the cause of your belly pain. Follow these instructions at home:  Medicines  Take over-the-counter and prescription medicines only as told by your doctor.  Do not take medicines that help you poop (laxatives) unless told by your doctor. General instructions  Watch your belly pain for any changes.  Drink enough fluid to keep your pee (urine) pale yellow.  Keep all follow-up visits as told by your doctor. This is important. Contact a doctor if:  Your belly pain changes or gets worse.  You are not hungry, or you lose weight without trying.  You are having trouble pooping (constipated) or have watery poop (diarrhea) for more than 2-3 days.  You have pain when you pee or poop.  Your belly pain wakes you up at night.  Your pain gets worse with meals, after eating, or with certain foods.  You are vomiting and cannot keep anything down.  You have a fever.  You have blood in your pee. Get help right away if:  Your pain does not go away as soon as your doctor says it should.  You cannot stop vomiting.  Your pain is only in areas of your belly, such as the right side or the left lower part of the belly.  You have bloody or black poop, or poop that looks like tar.  You have very bad pain, cramping, or bloating in your belly.  You have signs of not having enough fluid or water in your body (dehydration), such as: ? Dark pee, very little pee, or no pee. ? Cracked lips. ? Dry mouth. ? Sunken eyes. ? Sleepiness. ? Weakness.  You have trouble breathing or chest pain. Summary  Many cases of belly pain can be watched and treated at  home.  Watch your belly pain for any changes.  Take over-the-counter and prescription medicines only as told by your doctor.  Contact a doctor if your belly pain changes or gets worse.  Get help right away if you have very bad pain, cramping, or bloating in your belly. This information is not intended to replace advice given to you by your health care provider. Make sure you discuss any questions you have with your health care provider. Document Revised: 09/10/2018 Document Reviewed: 09/10/2018 Elsevier Patient Education  [place].

## 2020-03-30 NOTE — Progress Notes (Signed)
Patient ID: Pamela Hendrix, female    DOB: 1945-07-07, 74 y.o.   MRN: [id number]  PCP: Delsa Grana, PA-C  Chief Complaint  Patient presents with  . GI Problem    she wants a referral to GI  . Hernia    She had a hiatal hernia back in 2016 and it moved into her chest area. She would like to have an x-ray to make sure things are still the same.    Subjective:   Pamela Hendrix is a 74 y.o. female, presents to clinic with CC of the following:  HPI   Pt presents with GI concerns, wants Xray and GI f/up referral  Prior history of GERD, large hiatal hernia status post laparoscopic repair and toupee fundoplication, last colonoscopy and EGD was November 2018 after presenting to [facility] with hematochezia, she had a drop in hemoglobin with symptomatic was transfused and had EGD which was negative for acute blood loss, colonoscopy revealed left colonic severe diverticulitis which was suspected source of the hematochezia She did follow-up with GI after that hospitalization for a few visits over the next 6 months She has been established with  GI but she would like to see Dr. Verl Blalock in [place] which is closer to her    Current HPI: GI sx -patient reports to episodes of abdominal pain and cramping and tenesmus  In the past 1 to 2 weeks she has had 2 severe episodes of abdominal pain and cramping generalized without radiation with a urgency to have a bowel movement she reports the pain is crampy in nature, moderate to severe, lasts until she passes a bowel movement, she does feel slightly backed up and bloated she does have a history of constipation and hemorrhoids which she maintains with a dose of MiraLAX and Benefiber daily.  She notes that she is having more trouble with her bowel movements has to lean back on the commode and lean to the left in order to help stool pass typically she has at least 1 bowel movement per day and sometimes 1-3 soft formed bowel movements per  day. She denies any fever, nausea, vomiting.  Some of her symptoms are similar to when she was first diagnosed with a hiatal hernia so she does want some imaging done today to make sure her "stomach has not moved back into her chest"  GI Procedures:   Reviewed last EGD - hx of toupet fundoplication  Older hx per care everywhere Colonoscopy at [facility] in 05/2014,was reportedly normal  EGD findings: 04/07/2017 - Normal duodenal bulb and second portion of the duodenum.  - A Toupet fundoplication was found. The wrap appears tight. Dilated.  - Normal gastroesophageal junction and esophagus.  Colonoscopy findings: 04/07/2017 - The examined portion of the ileum was normal.  - Residual blood in the rectum, in the sigmoid colon, in the descending colon and at the splenic flexure.  - Diverticulosis in the sigmoid colon and in the descending colon, likely source of bleeding.  - Non-bleeding external hemorrhoids.  Patient Active Problem List   Diagnosis Date Noted  . Mild anemia 09/03/2019  . Vitamin D deficiency 09/03/2019  . Migraine aura without headache 01/01/2019  . Decreased GFR 01/01/2019  . CKD (chronic kidney disease) stage 3, GFR 30-59 ml/min (HCC) 12/04/2018  . PFO (patent foramen ovale) 07/31/2018  . Abnormal ECG 07/13/2018  . Bradycardia 07/13/2018  . Primary osteoarthritis of left knee 11/20/2017  . History of GI diverticular bleed 05/18/2017  . Vitamin B12  deficiency anemia 05/18/2017  . Chronic allergic rhinitis 06/28/2016  . Mixed hyperlipidemia 06/28/2016  . Hiatal hernia 08/28/2014  . Hypothyroid 08/28/2014  . Headache 10/25/2013  . Hypertension 10/25/2013  . Numbness and tingling 10/25/2013  . Obesity 10/25/2013  . Visual disturbance 10/25/2013  . Thyroid nodule 09/19/2013  . Onychomycosis due to dermatophyte 08/21/2013      Current Outpatient Medications:  .  aspirin EC 81 MG tablet, Take 81 mg by mouth daily., Disp: , Rfl:  .  cetirizine (ZYRTEC) 10 MG  tablet, Take 10 mg by mouth daily. otc, Disp: , Rfl:  .  Cholecalciferol (VITAMIN D-3) 1000 units CAPS, Take 1 capsule by mouth daily., Disp: , Rfl:  .  clobetasol ointment (TEMOVATE) 0.05 %, Apply topically 2 (two) times daily as needed., Disp: , Rfl:  .  fluticasone (FLONASE) 50 MCG/ACT nasal spray, Place 2 sprays into both nostrils daily. PRN, Disp: 16 g, Rfl: 2 .  levothyroxine (SYNTHROID) 88 MCG tablet, Take 1 tablet (88 mcg total) by mouth daily., Disp: 90 tablet, Rfl: 3 .  losartan (COZAAR) 100 MG tablet, Take 100 mg by mouth daily., Disp: , Rfl:  .  lovastatin (MEVACOR) 20 MG tablet, Take 1 tablet (20 mg total) by mouth daily., Disp: 90 tablet, Rfl: 3 .  montelukast (SINGULAIR) 10 MG tablet, Take 1 tablet (10 mg total) by mouth daily., Disp: 90 tablet, Rfl: 3 .  polyethylene glycol (MIRALAX / GLYCOLAX) 17 g packet, Take 17 g by mouth daily., Disp: , Rfl:    Allergies  Allergen Reactions  . Lisinopril     cough  . Codeine Nausea Only and Nausea And Vomiting     Social History   Tobacco Use  . Smoking status: Never Smoker  . Smokeless tobacco: Never Used  . Tobacco comment: Never, but father and husband smoked  Vaping Use  . Vaping Use: Never used  Substance Use Topics  . Alcohol use: Not Currently    Alcohol/week: 0.0 standard drinks    Comment: on special occasions like New Years Eve  . Drug use: Never      Chart Review Today: I personally reviewed active problem list, medication list, allergies, family history, social history, health maintenance, notes from last encounter, lab results, imaging with the patient/caregiver today.   Review of Systems 10 Systems reviewed and are negative for acute change except as noted in the HPI.     Objective:   Vitals:   03/30/20 1303  BP: 120/60  Pulse: 61  Resp: 16  Temp: 98.5 F (36.9 C)  TempSrc: Oral  SpO2: 99%  Weight: 161 lb 6.4 oz (73.2 kg)  Height: 5' (1.524 m)    Body mass index is 31.52 kg/m.  Physical  Exam Vitals and nursing note reviewed.  Constitutional:      General: She is not in acute distress.    Appearance: Normal appearance. She is well-developed and well-groomed. She is not ill-appearing, toxic-appearing or diaphoretic.     Interventions: Face mask in place.  HENT:     Head: Normocephalic and atraumatic.     Right Ear: External ear normal.     Left Ear: External ear normal.  Eyes:     General: No scleral icterus. Cardiovascular:     Rate and Rhythm: Normal rate and regular rhythm.     Pulses: Normal pulses.     Heart sounds: Normal heart sounds.  Pulmonary:     Effort: Pulmonary effort is normal. No respiratory distress.  Breath sounds: Normal breath sounds. No stridor. No wheezing or rales.  Abdominal:     General: Bowel sounds are normal. There is no distension.     Palpations: Abdomen is soft.     Tenderness: There is no abdominal tenderness. There is no right CVA tenderness, left CVA tenderness, guarding or rebound.  Skin:    General: Skin is warm and dry.     Coloration: Skin is not jaundiced or pale.  Neurological:     Mental Status: She is alert.     Gait: Gait normal.  Psychiatric:        Mood and Affect: Mood normal.        Behavior: Behavior normal. Behavior is cooperative.      Results for orders placed or performed in visit on 02/20/20  Lipid panel  Result Value Ref Range   Cholesterol 154 <200 mg/dL   HDL 61 > OR = 50 mg/dL   Triglycerides 128 <150 mg/dL   LDL Cholesterol (Calc) 72 mg/dL (calc)   Total CHOL/HDL Ratio 2.5 <5.0 (calc)   Non-HDL Cholesterol (Calc) 93 <130 mg/dL (calc)  COMPLETE METABOLIC PANEL WITH GFR  Result Value Ref Range   Glucose, Bld 101 (H) 65 - 99 mg/dL   BUN 13 7 - 25 mg/dL   Creat 1.23 (H) 0.60 - 0.93 mg/dL   GFR, Est Non African American 43 (L) > OR = 60 mL/min/1.106m2   GFR, Est African American 50 (L) > OR = 60 mL/min/1.21m2   BUN/Creatinine Ratio 11 6 - 22 (calc)   Sodium 140 135 - 146 mmol/L   Potassium 4.6  3.5 - 5.3 mmol/L   Chloride 105 98 - 110 mmol/L   CO2 26 20 - 32 mmol/L   Calcium 9.5 8.6 - 10.4 mg/dL   Total Protein 7.3 6.1 - 8.1 g/dL   Albumin 4.1 3.6 - 5.1 g/dL   Globulin 3.2 1.9 - 3.7 g/dL (calc)   AG Ratio 1.3 1.0 - 2.5 (calc)   Total Bilirubin 0.7 0.2 - 1.2 mg/dL   Alkaline phosphatase (APISO) 66 37 - 153 U/L   AST 17 10 - 35 U/L   ALT 10 6 - 29 U/L  CBC with Differential/Platelet  Result Value Ref Range   WBC 9.0 3.8 - 10.8 Thousand/uL   RBC 3.53 (L) 3.80 - 5.10 Million/uL   Hemoglobin 11.5 (L) 11.7 - 15.5 g/dL   HCT 34.4 (L) 35 - 45 %   MCV 97.5 80.0 - 100.0 fL   MCH 32.6 27.0 - 33.0 pg   MCHC 33.4 32.0 - 36.0 g/dL   RDW 13.0 11.0 - 15.0 %   Platelets 151 140 - 400 Thousand/uL   MPV 13.2 (H) 7.5 - 12.5 fL   Neutro Abs 6,732 1,500 - 7,800 cells/uL   Lymphs Abs 1,521 850 - 3,900 cells/uL   Absolute Monocytes 630 200 - 950 cells/uL   Eosinophils Absolute 63 15.0 - 500.0 cells/uL   Basophils Absolute 54 0.0 - 200.0 cells/uL   Neutrophils Relative % 74.8 %   Total Lymphocyte 16.9 %   Monocytes Relative 7.0 %   Eosinophils Relative 0.7 %   Basophils Relative 0.6 %  TSH  Result Value Ref Range   TSH 0.70 0.40 - 4.50 mIU/L  BASIC METABOLIC PANEL WITH GFR  Result Value Ref Range   Glucose, Bld 95 65 - 99 mg/dL   BUN 16 7 - 25 mg/dL   Creat 1.14 (H) 0.60 - 0.93 mg/dL  GFR, Est Non African American 47 (L) > OR = 60 mL/min/1.40m2   GFR, Est African American 55 (L) > OR = 60 mL/min/1.61m2   BUN/Creatinine Ratio 14 6 - 22 (calc)   Sodium 140 135 - 146 mmol/L   Potassium 4.7 3.5 - 5.3 mmol/L   Chloride 105 98 - 110 mmol/L   CO2 30 20 - 32 mmol/L   Calcium 9.4 8.6 - 10.4 mg/dL       Assessment & Plan:     ICD-10-CM   1. Abdominal pain, unspecified abdominal location  R10.9 DG Abd 1 View    Ambulatory referral to Gastroenterology  2. History of fundoplication  Y24.825 Ambulatory referral to Gastroenterology  3. History of diverticulitis of colon  Z87.19  Ambulatory referral to Gastroenterology  4. Constipation, unspecified constipation type  K59.00 Ambulatory referral to Gastroenterology  5. Hemorrhoids, unspecified hemorrhoid type  K64.9 Ambulatory referral to Gastroenterology   Patient presents with 2 episodes of crampy abdominal pain with tenesmus, does feel bloated and constipated she is also concerned about history of hiatal hernia with toupet fundoplication. She is not currently symptomatic Well-appearing elderly female no acute distress, abdominal exam unremarkable today in clinic She is concerned about a bowel obstruction but she is passing gas and having normal bowel movements with no bloating distention or progressive abdominal pain She is having episodes of crampy abdominal pain followed by urgency of bowel movements and some sensation of constipation.  She denies any melena or hematochezia, no vomiting  I do feel abdomen most prudent to follow-up with GI specialist given her history.  I did review with her concerning signs and symptoms which would warrant an ER visit  She has come in requesting imaging to recheck her hiatal hernia repair I explained to her that I can do a KUB however it may not show gastric bubble and she should follow-up with GI for monitoring this (may need EGD or other kinds of monitoring)  Again I discussed with the pt and offered KUB to simply check gas and bowel patterns and check diaphragm- mainly as a screening - it may not show stomach placement and will not show details of her past fundoplication surgery and without abdominal pain on exam she does not need a CT scan.       Delsa Grana, PA-C 03/30/20 1:19 PM

## 2020-03-31 ENCOUNTER — Telehealth: Payer: Self-pay

## 2020-03-31 ENCOUNTER — Ambulatory Visit
Admission: RE | Admit: 2020-03-31 | Discharge: 2020-03-31 | Disposition: A | Discharge status: Home / Self Care | Payer: Medicare HMO | Attending: Family Medicine | Admitting: Family Medicine

## 2020-03-31 ENCOUNTER — Ambulatory Visit
Admission: RE | Admit: 2020-03-31 | Discharge: 2020-03-31 | Disposition: A | Discharge status: Home / Self Care | Payer: Medicare HMO | Source: Ambulatory Visit | Attending: Family Medicine | Admitting: Family Medicine

## 2020-03-31 ENCOUNTER — Other Ambulatory Visit: Payer: Self-pay

## 2020-03-31 DIAGNOSIS — R109 Unspecified abdominal pain: Secondary | ICD-10-CM

## 2020-03-31 NOTE — Telephone Encounter (Signed)
Copied from [place] [phone]. Topic: General - Other >> Mar 31, 2020 10:08 AM Leward Quan A wrote: Reason for CRM: Patient called to inquire if the nurse of Pamela Hendrix if they were able to determine where her Xray would be if it would be a chest or an abdominal Xray or both. Asking for a call back please Ph# [phone]

## 2020-04-01 ENCOUNTER — Encounter: Payer: Self-pay | Admitting: Family Medicine

## 2020-04-01 NOTE — Telephone Encounter (Signed)
Were you putting in order for abdominal xrays

## 2020-04-02 NOTE — Telephone Encounter (Signed)
Patient notified of results.

## 2020-04-15 ENCOUNTER — Other Ambulatory Visit: Payer: Self-pay

## 2020-04-15 ENCOUNTER — Ambulatory Visit
Admission: RE | Admit: 2020-04-15 | Discharge: 2020-04-15 | Disposition: A | Discharge status: Home / Self Care | Payer: Medicare HMO | Source: Ambulatory Visit | Attending: Family Medicine | Admitting: Family Medicine

## 2020-04-15 DIAGNOSIS — Z780 Asymptomatic menopausal state: Secondary | ICD-10-CM

## 2020-04-15 DIAGNOSIS — Z8739 Personal history of other diseases of the musculoskeletal system and connective tissue: Secondary | ICD-10-CM | POA: Diagnosis not present

## 2020-04-15 DIAGNOSIS — R29890 Loss of height: Secondary | ICD-10-CM | POA: Diagnosis not present

## 2020-04-15 DIAGNOSIS — Z1231 Encounter for screening mammogram for malignant neoplasm of breast: Secondary | ICD-10-CM

## 2020-05-12 ENCOUNTER — Telehealth (INDEPENDENT_AMBULATORY_CARE_PROVIDER_SITE_OTHER): Payer: Medicare HMO | Admitting: Family Medicine

## 2020-05-12 ENCOUNTER — Other Ambulatory Visit: Payer: Self-pay

## 2020-05-12 ENCOUNTER — Encounter: Payer: Self-pay | Admitting: Family Medicine

## 2020-05-12 VITALS — BP 141/73 | HR 59 | Ht 62.0 in | Wt 160.8 lb

## 2020-05-12 DIAGNOSIS — M816 Localized osteoporosis [Lequesne]: Secondary | ICD-10-CM | POA: Diagnosis not present

## 2020-05-12 MED ORDER — ALENDRONATE SODIUM 70 MG PO TABS: 70.0000 mg | ORAL_TABLET | ORAL | 4 refills | Status: DC

## 2020-05-12 NOTE — Progress Notes (Signed)
Name: Pamela Hendrix   MRN: [id number]    DOB: 1945-10-02   Date:05/12/2020       Progress Note  Subjective:    Chief Complaint  Chief Complaint  Patient presents with  . Follow-up    Bone Density    I connected with  Rigoberto Noel  on 05/12/20 at 11:40 AM EST by a video enabled telemedicine application and verified that I am speaking with the correct person using two identifiers.  I discussed the limitations of evaluation and management by telemedicine and the availability of in person appointments. The patient expressed understanding and agreed to proceed. Staff also discussed with the patient that there may be a patient responsible charge related to this service. Patient Location: home Provider Location: [facility] Additional Individuals present:     HPI Dexa scan showed worsening bone density of spine and left femur-spine significant for osteoporosis Reviewed results today, patient does not supplement calcium but does believe she gets in her diet she is supplementing vitamin D, no recent falls Last DEXA was 2017 reviewed and compared with 2021 results  Her sister is doing Prolia treatment  Patient has not been on bisphosphonates before, no known allergies she reports good dental health sees a dentist regularly every 6 months  Patient Active Problem List   Diagnosis Date Noted  . Mild anemia 09/03/2019  . Vitamin D deficiency 09/03/2019  . Migraine aura without headache 01/01/2019  . Decreased GFR 01/01/2019  . CKD (chronic kidney disease) stage 3, GFR 30-59 ml/min (HCC) 12/04/2018  . PFO (patent foramen ovale) 07/31/2018  . Abnormal ECG 07/13/2018  . Bradycardia 07/13/2018  . Primary osteoarthritis of left knee 11/20/2017  . History of GI diverticular bleed 05/18/2017  . Vitamin B12 deficiency anemia 05/18/2017  . Chronic allergic rhinitis 06/28/2016  . Mixed hyperlipidemia 06/28/2016  . Hiatal hernia 08/28/2014  . Hypothyroid 08/28/2014  .  Headache 10/25/2013  . Hypertension 10/25/2013  . Numbness and tingling 10/25/2013  . Obesity 10/25/2013  . Visual disturbance 10/25/2013  . Thyroid nodule 09/19/2013  . Onychomycosis due to dermatophyte 08/21/2013    Social History   Tobacco Use  . Smoking status: Never Smoker  . Smokeless tobacco: Never Used  . Tobacco comment: Never, but father and husband smoked  Substance Use Topics  . Alcohol use: Not Currently    Alcohol/week: 0.0 standard drinks    Comment: on special occasions like New Years Eve     Current Outpatient Medications:  .  aspirin EC 81 MG tablet, Take 81 mg by mouth daily., Disp: , Rfl:  .  cetirizine (ZYRTEC) 10 MG tablet, Take 10 mg by mouth daily. otc, Disp: , Rfl:  .  Cholecalciferol (VITAMIN D-3) 1000 units CAPS, Take 1 capsule by mouth daily., Disp: , Rfl:  .  clobetasol ointment (TEMOVATE) 0.05 %, Apply topically 2 (two) times daily as needed., Disp: , Rfl:  .  fluticasone (FLONASE) 50 MCG/ACT nasal spray, Place 2 sprays into both nostrils daily. PRN, Disp: 16 g, Rfl: 2 .  levothyroxine (SYNTHROID) 88 MCG tablet, Take 1 tablet (88 mcg total) by mouth daily., Disp: 90 tablet, Rfl: 3 .  losartan (COZAAR) 100 MG tablet, Take 100 mg by mouth daily., Disp: , Rfl:  .  lovastatin (MEVACOR) 20 MG tablet, Take 1 tablet (20 mg total) by mouth daily., Disp: 90 tablet, Rfl: 3 .  montelukast (SINGULAIR) 10 MG tablet, Take 1 tablet (10 mg total) by mouth daily., Disp: 90 tablet, Rfl:  3 .  polyethylene glycol (MIRALAX / GLYCOLAX) 17 g packet, Take 17 g by mouth daily., Disp: , Rfl:   Allergies  Allergen Reactions  . Lisinopril     cough  . Codeine Nausea Only and Nausea And Vomiting   I personally reviewed active problem list, medication list, allergies, family history, social history, health maintenance, notes from last encounter, lab results, imaging with the patient/caregiver today.   Review of Systems  10 Systems reviewed and are negative for acute change  except as noted in the HPI.   Objective:   Virtual encounter, vitals limited, only able to obtain the following Today's Vitals   05/12/20 1155  BP: (!) 141/73  Pulse: (!) 59  Weight: 160 lb 12.8 oz (72.9 kg)  Height: 5\' 2"  (1.575 m)   Body mass index is 29.41 kg/m. Nursing Note and Vital Signs reviewed.  Physical Exam Phonation clear, patient alert PE limited by telephone encounter  No results found for this or any previous visit (from the past 72 hour(s)).  Assessment and Plan:     ICD-10-CM   1. Localized osteoporosis without current pathological fracture  M81.6    To spine, discussed management with bisphosphonates  About a 18-minute discussion today on medications and management of osteoporosis-I discussed Fosamax dosing and potential side effects and did send into the pharmacy, afterwards patient did remind me that she is following up with GI specialist -I had seen her recently with lower GI concerns change in bowel movements, and she has a history of fundoplication surgery and hiatal hernia, today she reports that she is always had a problem with dysphagia and has had multiple dilatations in the past.  She is due to establish care with Dr. Allen Norris next month -I did cancel the Fosamax medication and we will need to confer with the GI specialist to see if this is a option for her to treat her osteoporosis Patient also wishes to consult with her dentist for clearance as well  Additionally reviewed that if Fosamax is not a good option for her with her GI history that we may be able to look into prolia?  Drug info, contraindications and references for further drug info given on AVS  Additional info given on osteoporosis and preventing osteoporosis  We did discuss adequate calcium in diet if she is unable to take calcium supplement and discussed vitamin D supplementation dosing range [id number] IU daily  - I discussed the assessment and treatment plan with the patient. The patient was  provided an opportunity to ask questions and all were answered. The patient agreed with the plan and demonstrated an understanding of the instructions.  I provided 25+  minutes of non-face-to-face time during this encounter, roughly 18 min on the phone and additional time charting, reviewing results/records, arranging care and providing AVS, etc.  Delsa Grana, PA-C 05/12/20 12:23 PM

## 2020-05-12 NOTE — Patient Instructions (Addendum)
Ask Dr. Allen Norris about bisphosphonates, Fosamax or Prolia for management of osteoporosis with your other GI history  You can consult with your dentist as well  Please let me know what they recommend  Fosamax general drug info: See: Fosamax: StatMob.pl [id number] [email or web address]#[email or web address]   Contraindications Hypersensitivity to alendronate or any component of the formulation; hypocalcemia; abnormalities of the esophagus (eg, stricture, achalasia) (please check with GI with your history) which delay esophageal emptying; inability to stand or sit upright for at least 30 minutes; increased risk of aspiration (effervescent tablets; oral solution)   Prolia- may be a better option: Osteoporosis, fracture risk reduction (males and postmenopausal females) (alternative agent): Note: For use in patients in whom first-line therapies are ineffective or cannot be used, or as an alternative antiresorptive agent following anabolic therapy. Therapy following discontinuation must be well-coordinated; avoid use in patients who have difficulty adhering to medication or appointment schedules.  Prior to use, evaluate and treat any potential causes of secondary osteoporosis (eg, severe vitamin D deficiency)  SUBQ: 60 mg once every 6 months. Duration of therapy: The optimal duration of therapy has not been established. If fracture risk remains high after 5 to 10 years of therapy, consider extending therapy or switching to alternative therapy   Osteoporosis  Osteoporosis happens when your bones get thin and weak. This can cause your bones to break (fracture) more easily. You can do things at home to make your bones stronger. Follow these instructions at home:  Activity  Exercise as told by your doctor. Ask your doctor what activities are safe for you. You should do: ? Exercises that make your muscles work to hold your body weight up (weight-bearing exercises). These  include tai chi, yoga, and walking. ? Exercises to make your muscles stronger. One example is lifting weights. Lifestyle  Limit alcohol intake to no more than 1 drink a day for nonpregnant women and 2 drinks a day for men. One drink equals 12 oz of beer, 5 oz of wine, or 1 oz of hard liquor.  Do not use any products that have nicotine or tobacco in them. These include cigarettes and e-cigarettes. If you need help quitting, ask your doctor. Preventing falls  Use tools to help you move around (mobility aids) as needed. These include canes, walkers, scooters, and crutches.  Keep rooms well-lit and free of clutter.  Put away things that could make you trip. These include cords and rugs.  Install safety rails on stairs. Install grab bars in bathrooms.  Use rubber mats in slippery areas, like bathrooms.  Wear shoes that: ? Fit you well. ? Support your feet. ? Have closed toes. ? Have rubber soles or low heels.  Tell your doctor about all of the medicines you are taking. Some medicines can make you more likely to fall. General instructions  Eat plenty of calcium and vitamin D. These nutrients are good for your bones. Good sources of calcium and vitamin D include: ? Some fatty fish, such as salmon and tuna. ? Foods that have calcium and vitamin D added to them (fortified foods). For example, some breakfast cereals are fortified with calcium and vitamin D. ? Egg yolks. ? Cheese. ? Liver.  Take over-the-counter and prescription medicines only as told by your doctor.  Keep all follow-up visits as told by your doctor. This is important. Contact a doctor if:  You have not been tested (screened) for osteoporosis and you are: ? A woman who is age 43 or older. ? A man who is  age 54 or older. Get help right away if:  You fall.  You get hurt. Summary  Osteoporosis happens when your bones get thin and weak.  Weak bones can break (fracture) more easily.  Eat plenty of calcium and  vitamin D. These nutrients are good for your bones.  Tell your doctor about all of the medicines that you take. This information is not intended to replace advice given to you by your health care provider. Make sure you discuss any questions you have with your health care provider. Document Revised: 04/14/2017 Document Reviewed: 02/24/2017 Elsevier Patient Education  2020 [employer].   Preventing Osteoporosis, Adult Osteoporosis is a condition that causes the bones to lose density. This means that the bones become thinner, and the normal spaces in bone tissue become larger. Low bone density can make the bones weak and cause them to break more easily. Osteoporosis cannot always be prevented, but you can take steps to lower your risk of developing this condition. How can this condition affect me? If you develop osteoporosis, you will be more likely to break bones in your wrist, spine, or hip. Even a minor accident or injury can be enough to break weak bones. The bones will also be slower to heal. Osteoporosis can cause other problems as well, such as a stooped posture or trouble with movement. Osteoporosis can occur with aging. As you get older, you may lose bone tissue more quickly, or it may be replaced more slowly. Osteoporosis is more likely to develop if you have poor nutrition or do not get enough calcium or vitamin D. Other lifestyle factors can also play a role. By eating a well-balanced diet and making lifestyle changes, you can help keep your bones strong and healthy, lowering your chances of developing osteoporosis. What can increase my risk? The following factors may make you more likely to develop osteoporosis:  Having a family history of the condition.  Having poor nutrition or not getting enough calcium or vitamin D.  Using certain medicines, such as steroid medicines or antiseizure medicines.  Being any of the following: ? 26 years of age or older. ? Female. ? A woman who  has gone through menopause (is postmenopausal). ? White (Caucasian) or of Asian descent.  Smoking or having a history of smoking.  Not being physically active (being sedentary).  Having a small body frame. What actions can I take to prevent this?  Get enough calcium   Make sure you get enough calcium every day. Calcium is the most important mineral for bone health. Most people can get enough calcium from their diet, but supplements may be recommended for people who are at risk for osteoporosis. Follow these guidelines: ? If you are age 37 or younger, aim to get 1,000 mg of calcium every day. ? If you are older than age 33, aim to get 1,200 mg of calcium every day.  Good sources of calcium include: ? Dairy products, such as low-fat or nonfat milk, cheese, and yogurt. ? Dark green leafy vegetables, such as bok choy and broccoli. ? Foods that have had calcium added to them (calcium-fortified foods), such as orange juice, cereal, bread, soy beverages, and tofu products. ? Nuts, such as almonds.  Check nutrition labels to see how much calcium is in a food or drink. Get enough vitamin D  Try to get enough vitamin D every day. Vitamin D is the most essential vitamin for bone health. It helps the body absorb calcium. Follow these guidelines  for how much vitamin D to get from food: ? If you are age 34 or younger, aim to get at least 600 international units (IU) every day. Your health care provider may suggest more. ? If you are older than age 5, aim to get at least 800 international units every day. Your health care provider may suggest more.  Good sources of vitamin D in your diet include: ? Egg yolks. ? Oily fish, such as salmon, sardines, and tuna. ? Milk and cereal fortified with vitamin D.  Your body also makes vitamin D when you are out in the sun. Exposing the bare skin on your face, arms, legs, or back to the sun for no more than 30 minutes a day, 2 times a week is more than  enough. Beyond that, make sure you use sunblock to protect your skin from sunburn, which increases your risk for skin cancer. Exercise  Stay active and get exercise every day.  Ask your health care provider what types of exercise are best for you. Weight-bearing and strength-building activities are important for building and maintaining healthy bones. Some examples of these types of activities include: ? Walking and hiking. ? Jogging and running. ? Dancing. ? Gym exercises. ? Lifting weights. ? Tennis and racquetball. ? Climbing stairs. ? Aerobics. Make other lifestyle changes  Do not use any products that contain nicotine or tobacco, such as cigarettes, e-cigarettes, and chewing tobacco. If you need help quitting, ask your health care provider.  Lose weight if you are overweight.  If you drink alcohol: ? Limit how much you use to:  0-1 drink a day for nonpregnant women.  0-2 drinks a day for men. ? Be aware of how much alcohol is in your drink. In the [place] one drink equals one 12 oz bottle of beer (355 mL), one 5 oz glass of wine (148 mL), or one 1 oz glass of hard liquor (44 mL). Where to find support If you need help making changes to prevent osteoporosis, talk with your health care provider. You can ask for a referral to a diet and nutrition specialist (dietitian) and a physical therapist. Where to find more information Learn more about osteoporosis from:  NIH Osteoporosis and Related [place]: www.bones.SouthExposed.es  [place] Office on [employer]: [place]Signs.tn  [place]: EquipmentWeekly.com.ee Summary  Osteoporosis is a condition that causes weak bones that are more likely to break.  Eat a healthy diet, making sure you get enough calcium and vitamin D, and stay active by getting regular exercise to help prevent osteoporosis.  Other ways to reduce your risk of osteoporosis include maintaining a healthy weight and avoiding  alcohol and products that contain nicotine or tobacco. This information is not intended to replace advice given to you by your health care provider. Make sure you discuss any questions you have with your health care provider. Document Revised: 11/30/2018 Document Reviewed: 11/30/2018 Elsevier Patient Education  [place].

## 2020-05-25 ENCOUNTER — Ambulatory Visit: Payer: Medicare HMO | Admitting: Gastroenterology

## 2020-05-25 ENCOUNTER — Encounter: Payer: Self-pay | Admitting: Gastroenterology

## 2020-05-25 ENCOUNTER — Other Ambulatory Visit: Payer: Self-pay

## 2020-05-25 VITALS — BP 113/63 | HR 60 | Temp 98.3°F | Ht 62.0 in | Wt 151.4 lb

## 2020-05-25 DIAGNOSIS — K582 Mixed irritable bowel syndrome: Secondary | ICD-10-CM | POA: Diagnosis not present

## 2020-05-25 MED ORDER — DICYCLOMINE HCL 10 MG PO CAPS: 10.0000 mg | ORAL_CAPSULE | Freq: Three times a day (TID) | ORAL | 3 refills | Status: DC

## 2020-05-25 NOTE — Progress Notes (Signed)
Primary Care Physician: Delsa Grana, PA-C  Primary Gastroenterologist:  Dr. Lucilla Lame  Chief Complaint  Patient presents with  . Constipation  . Abdominal Pain    HPI: Pamela Hendrix is a 75 y.o. female here with a history of abdominal pain and constipation.  The patient had a colonoscopy in 2016 at [facility] and a repeat colonoscopy in 2018 by Dr. Marius Ditch.  At the last colonoscopy in 2018 the impression was:  - Non-thrombosed external hemorrhoids found on perianal exam. - The examined portion of the ileum was normal. - Residual blood in the rectum, in the sigmoid colon, in the descending colon and at the splenic flexure. - Diverticulosis in the sigmoid colon and in the descending colon, likely source of bleeding. - Non-bleeding external hemorrhoids. - No specimens collected.  The patient recently saw her primary care provider with concerns about her hiatal hernia.  She has had a history of antireflux surgery in the past.  She had reported abdominal cramps with tenesmus and constipation that occurred twice before seeing her primary care provider therefore she was asking for a referral to GI.  The patient reports that she has abdominal cramps mostly when she is constipated but states that her cramps are present 70% of the time even when she is not constipated.  There is no report of any black stools or bloody stools.  She also denies any unexplained weight loss fevers chills nausea or vomiting.  She reports that she takes both MiraLAX and fiber.  Past Medical History:  Diagnosis Date  . Allergy   . Anemia 2000   estimate  . Blood transfusion without reported diagnosis 04/07/2017   estimate  . Bursitis   . CKD (chronic kidney disease) stage 3, GFR 30-59 ml/min (HCC)   . Diverticulosis   . GERD (gastroesophageal reflux disease)   . Hiatal hernia   . Hypertension   . Migraine with aura    states she doesn't have a headache, just the aura  . Mixed  hyperlipidemia   . Osteoarthritis of knee   . Thyroid disease     Current Outpatient Medications  Medication Sig Dispense Refill  . aspirin EC 81 MG tablet Take 81 mg by mouth daily.    . cetirizine (ZYRTEC) 10 MG tablet Take 10 mg by mouth daily. otc    . Cholecalciferol (VITAMIN D-3) 1000 units CAPS Take 1 capsule by mouth daily.    . clobetasol ointment (TEMOVATE) 0.05 % Apply topically 2 (two) times daily as needed.    . fluticasone (FLONASE) 50 MCG/ACT nasal spray Place 2 sprays into both nostrils daily. PRN 16 g 2  . levothyroxine (SYNTHROID) 88 MCG tablet Take 1 tablet (88 mcg total) by mouth daily. 90 tablet 3  . losartan (COZAAR) 100 MG tablet Take 100 mg by mouth daily.    Marland Kitchen lovastatin (MEVACOR) 20 MG tablet Take 1 tablet (20 mg total) by mouth daily. 90 tablet 3  . montelukast (SINGULAIR) 10 MG tablet Take 1 tablet (10 mg total) by mouth daily. 90 tablet 3  . polyethylene glycol (MIRALAX / GLYCOLAX) 17 g packet Take 17 g by mouth daily.     No current facility-administered medications for this visit.    Allergies as of 05/25/2020 - Review Complete 05/25/2020  Allergen Reaction Noted  . Lisinopril  05/01/2019  . Codeine Nausea Only and Nausea And Vomiting 12/24/2013    ROS:  General: Negative for anorexia, weight loss, fever, chills, fatigue, weakness.  ENT: Negative for hoarseness, difficulty swallowing , nasal congestion. CV: Negative for chest pain, angina, palpitations, dyspnea on exertion, peripheral edema.  Respiratory: Negative for dyspnea at rest, dyspnea on exertion, cough, sputum, wheezing.  GI: See history of present illness. GU:  Negative for dysuria, hematuria, urinary incontinence, urinary frequency, nocturnal urination.  Endo: Negative for unusual weight change.    Physical Examination:   BP 113/63   Pulse 60   Temp 98.3 F (36.8 C) (Temporal)   Ht 5\' 2"  (1.575 m)   Wt 151 lb 6.4 oz (68.7 kg)   BMI 27.69 kg/m   General: Well-nourished,  well-developed in no acute distress.  Eyes: No icterus. Conjunctivae pink. Lungs: Clear to auscultation bilaterally. Non-labored. Heart: Regular rate and rhythm, no murmurs rubs or gallops.  Abdomen: Bowel sounds are normal, nontender, nondistended, no hepatosplenomegaly or masses, no abdominal bruits or hernia , no rebound or guarding.   Extremities: No lower extremity edema. No clubbing or deformities. Neuro: Alert and oriented x 3.  Grossly intact. Skin: Warm and dry, no jaundice.   Psych: Alert and cooperative, normal mood and affect.  Labs:    Imaging Studies: No results found.  Assessment and Plan:   Pamela Hendrix is a 75 y.o. y/o female who comes in with a history of alternating diarrhea and constipation.  The patient has been diagnosed with chronic idiopathic constipation although she does have intermittent bouts of diarrhea.  The patient will be started on dicyclomine 10 mg 3 times a day for the abdominal cramps and has been told to stop the MiraLAX that she has been taking intermittently with her fiber and has been told to double up on her fiber while limiting the MiraLAX.  The patient will let me know if her symptoms do not improve.  The patient has been explained the plan and agrees with it.     Lucilla Lame, MD. [facility]al    Note: This dictation was prepared with Dragon dictation along with smaller phrase technology. Any transcriptional errors that result from this process are unintentional.

## 2020-06-17 ENCOUNTER — Other Ambulatory Visit: Payer: Self-pay | Admitting: Family Medicine

## 2020-06-17 DIAGNOSIS — E039 Hypothyroidism, unspecified: Secondary | ICD-10-CM

## 2020-06-23 ENCOUNTER — Telehealth: Payer: Self-pay | Admitting: Gastroenterology

## 2020-06-23 NOTE — Telephone Encounter (Signed)
There is no report of any dry heaves when she had last seen me this appears to be new. Since she is not better than Traci you need to make her an office visit so that she can come in and discuss her new and existing issues

## 2020-06-23 NOTE — Telephone Encounter (Signed)
dicyclomine (BENTYL) 10 MG capsule taking for one month and still having problem and pain and dry heaves. What does she need to do? Please call.

## 2020-07-23 ENCOUNTER — Other Ambulatory Visit: Payer: Self-pay

## 2020-07-23 ENCOUNTER — Encounter: Payer: Self-pay | Admitting: Gastroenterology

## 2020-07-23 ENCOUNTER — Ambulatory Visit (INDEPENDENT_AMBULATORY_CARE_PROVIDER_SITE_OTHER): Payer: Medicare HMO | Admitting: Gastroenterology

## 2020-07-23 VITALS — BP 163/72 | HR 67 | Temp 97.3°F | Ht 62.0 in | Wt 162.4 lb

## 2020-07-23 DIAGNOSIS — K582 Mixed irritable bowel syndrome: Secondary | ICD-10-CM | POA: Diagnosis not present

## 2020-07-23 NOTE — Progress Notes (Signed)
Primary Care Physician: Delsa Grana, PA-C  Primary Gastroenterologist:  Dr. Lucilla Lame  Chief Complaint  Patient presents with  . Abdominal Pain    HPI: Pamela Hendrix is a 75 y.o. female here for follow-up after seeing me in January for alternating diarrhea and constipation with abdominal discomfort.  The patient was taking MiraLAX and fiber.  The patient was told to stop MiraLAX and to Increase her fiber. The patient was also started on dicyclomine 3 times a day for her abdominal cramps. The patient called 1 month later and reported that she was having continued abdominal pain despite taking the dicyclomine and had dry heaving which she had not reported to me at the prior visit.  Since she was having a new issue I recommended that the patient come in to be seen in the office to discuss any further investigation. The vomiting stopped last month. She reports that the bentyl is now working for her.  She reports that her dicyclomine did not work at first but then started working and she feels much better at the present time.  She also states that she stopped the MiraLAX that she has not been having any diarrhea  Past Medical History:  Diagnosis Date  . Allergy   . Anemia 2000   estimate  . Blood transfusion without reported diagnosis 04/07/2017   estimate  . Bursitis   . CKD (chronic kidney disease) stage 3, GFR 30-59 ml/min (HCC)   . Diverticulosis   . GERD (gastroesophageal reflux disease)   . Hiatal hernia   . Hypertension   . Migraine with aura    states she doesn't have a headache, just the aura  . Mixed hyperlipidemia   . Osteoarthritis of knee   . Thyroid disease     Current Outpatient Medications  Medication Sig Dispense Refill  . aspirin EC 81 MG tablet Take 81 mg by mouth daily.    . cetirizine (ZYRTEC) 10 MG tablet Take 10 mg by mouth daily. otc    . Cholecalciferol (VITAMIN D-3) 1000 units CAPS Take 1 capsule by mouth daily.    . clobetasol ointment  (TEMOVATE) 0.05 % Apply topically 2 (two) times daily as needed.    . dicyclomine (BENTYL) 10 MG capsule Take 1 capsule (10 mg total) by mouth 3 (three) times daily before meals. 90 capsule 3  . EUTHYROX 88 MCG tablet Take 1 tablet by mouth once daily 90 tablet 0  . fluticasone (FLONASE) 50 MCG/ACT nasal spray Place 2 sprays into both nostrils daily. PRN 16 g 2  . Inulin (FIBER CHOICE PO) Take by mouth.    . losartan (COZAAR) 100 MG tablet Take 100 mg by mouth daily.    Marland Kitchen lovastatin (MEVACOR) 20 MG tablet Take 1 tablet (20 mg total) by mouth daily. 90 tablet 3  . montelukast (SINGULAIR) 10 MG tablet Take 1 tablet (10 mg total) by mouth daily. 90 tablet 3   No current facility-administered medications for this visit.    Allergies as of 07/23/2020 - Review Complete 07/23/2020  Allergen Reaction Noted  . Lisinopril  05/01/2019  . Codeine Nausea Only and Nausea And Vomiting 12/24/2013    ROS:  General: Negative for anorexia, weight loss, fever, chills, fatigue, weakness. ENT: Negative for hoarseness, difficulty swallowing , nasal congestion. CV: Negative for chest pain, angina, palpitations, dyspnea on exertion, peripheral edema.  Respiratory: Negative for dyspnea at rest, dyspnea on exertion, cough, sputum, wheezing.  GI: See history of present illness. GU:  Negative for dysuria, hematuria, urinary incontinence, urinary frequency, nocturnal urination.  Endo: Negative for unusual weight change.    Physical Examination:   BP (!) 163/72   Pulse 67   Temp (!) 97.3 F (36.3 C) (Temporal)   Ht 5\' 2"  (1.575 m)   Wt 162 lb 6.4 oz (73.7 kg)   BMI 29.70 kg/m   General: Well-nourished, well-developed in no acute distress.  Eyes: No icterus. Conjunctivae pink. Lungs: Clear to auscultation bilaterally. Non-labored. Heart: Regular rate and rhythm, no murmurs rubs or gallops.  Abdomen: Bowel sounds are normal, nontender, nondistended, no hepatosplenomegaly or masses, no abdominal bruits or  hernia , no rebound or guarding.   Extremities: No lower extremity edema. No clubbing or deformities. Neuro: Alert and oriented x 3.  Grossly intact. Skin: Warm and dry, no jaundice.   Psych: Alert and cooperative, normal mood and affect.  Labs:    Imaging Studies: No results found.  Assessment and Plan:   Pamela Hendrix is a 75 y.o. y/o female who comes in today after being seen by me in the past for diarrhea and constipation.  The patient also had called the office with a report of dry heaving.  The patient states that dry heaving has stopped and her diarrhea has stopped with the stopping of the MiraLAX.  The patient has been doing well and states the dicyclomine is finally working for her.  The patient will follow up as needed.     Lucilla Lame, MD. [facility]al    Note: This dictation was prepared with Dragon dictation along with smaller phrase technology. Any transcriptional errors that result from this process are unintentional.

## 2020-08-20 ENCOUNTER — Encounter: Payer: Self-pay | Admitting: Family Medicine

## 2020-08-20 ENCOUNTER — Other Ambulatory Visit: Payer: Self-pay

## 2020-08-20 ENCOUNTER — Ambulatory Visit (INDEPENDENT_AMBULATORY_CARE_PROVIDER_SITE_OTHER): Payer: Medicare HMO | Admitting: Family Medicine

## 2020-08-20 VITALS — BP 132/78 | HR 76 | Temp 98.6°F | Resp 16 | Ht 62.0 in | Wt 159.6 lb

## 2020-08-20 DIAGNOSIS — N1831 Chronic kidney disease, stage 3a: Secondary | ICD-10-CM

## 2020-08-20 DIAGNOSIS — E782 Mixed hyperlipidemia: Secondary | ICD-10-CM

## 2020-08-20 DIAGNOSIS — E039 Hypothyroidism, unspecified: Secondary | ICD-10-CM

## 2020-08-20 DIAGNOSIS — I10 Essential (primary) hypertension: Secondary | ICD-10-CM | POA: Diagnosis not present

## 2020-08-20 DIAGNOSIS — J309 Allergic rhinitis, unspecified: Secondary | ICD-10-CM | POA: Diagnosis not present

## 2020-08-20 DIAGNOSIS — D696 Thrombocytopenia, unspecified: Secondary | ICD-10-CM

## 2020-08-20 DIAGNOSIS — D649 Anemia, unspecified: Secondary | ICD-10-CM

## 2020-08-20 MED ORDER — LOVASTATIN 20 MG PO TABS: 20.0000 mg | ORAL_TABLET | Freq: Every day | ORAL | 3 refills | Status: DC

## 2020-08-20 MED ORDER — LEVOTHYROXINE SODIUM 88 MCG PO TABS: 88.0000 ug | ORAL_TABLET | Freq: Every day | ORAL | 3 refills | Status: DC

## 2020-08-20 MED ORDER — MONTELUKAST SODIUM 10 MG PO TABS: 10.0000 mg | ORAL_TABLET | Freq: Every day | ORAL | 3 refills | Status: DC

## 2020-08-20 NOTE — Progress Notes (Signed)
Name: Rhya Shan   MRN: [id number]    DOB: [birth year]   Date:08/20/2020       Progress Note  Chief Complaint  Patient presents with  . Follow-up  . Hypothyroidism  . Hypertension     Subjective:   Pamela Hendrix is a 75 y.o. female, presents to clinic for routine f/up  Hypertension:  Currently managed on losartan 100 mg Pt reports good med compliance and denies any SE.   Blood pressure today is well controlled. BP Readings from Last 3 Encounters:  08/20/20 132/78  07/23/20 (!) 163/72  05/25/20 113/63   Pt denies CP, SOB, exertional sx, LE edema, palpitation, Ha's, visual disturbances, lightheadedness, hypotension, syncope.  HLD on lovastatin 20 mg daily at bedtime.  Osteoporosis on fosamax once weekly per specialists - taking in the am prior to thyroid meds  Hypothyroidism:  Current Medication Regimen: 76mg synthroid Takes medicine  Current Symptoms: denies fatigue, weight changes, heat/cold intolerance, bowel/skin changes or CVS symptoms Most recent results are below; we will be repeating labs today. Lab Results  Component Value Date   TSH 0.70 02/20/2020       Health Maintenance  Topic Date Due  . TSamul Dada 10/13/2020  . INFLUENZA VACCINE  12/14/2020  . MAMMOGRAM  04/15/2021  . COLONOSCOPY (Pts 45-42yrInsurance coverage will need to be confirmed)  04/07/2022  . DEXA SCAN  04/15/2022  . COVID-19 Vaccine  Completed  . Hepatitis C Screening  Completed  . PNA vac Low Risk Adult  Completed  . HPV VACCINES  Aged Out      Current Outpatient Medications:  .  aspirin EC 81 MG tablet, Take 81 mg by mouth daily., Disp: , Rfl:  .  cetirizine (ZYRTEC) 10 MG tablet, Take 10 mg by mouth daily. otc, Disp: , Rfl:  .  Cholecalciferol (VITAMIN D-3) 1000 units CAPS, Take 1 capsule by mouth daily., Disp: , Rfl:  .  clobetasol ointment (TEMOVATE) 0.05 %, Apply topically 2 (two) times daily as needed., Disp: , Rfl:  .  dicyclomine (BENTYL) 10  MG capsule, Take 1 capsule (10 mg total) by mouth 3 (three) times daily before meals., Disp: 90 capsule, Rfl: 3 .  fluticasone (FLONASE) 50 MCG/ACT nasal spray, Place 2 sprays into both nostrils daily. PRN, Disp: 16 g, Rfl: 2 .  Inulin (FIBER CHOICE PO), Take by mouth., Disp: , Rfl:  .  losartan (COZAAR) 100 MG tablet, Take 100 mg by mouth daily., Disp: , Rfl:  .  alendronate (FOSAMAX) 70 MG tablet, Take 70 mg by mouth once a week., Disp: , Rfl:  .  levothyroxine (EUTHYROX) 88 MCG tablet, Take 1 tablet (88 mcg total) by mouth daily. First thing in the morning on empty stomach, Disp: 90 tablet, Rfl: 3 .  lovastatin (MEVACOR) 20 MG tablet, Take 1 tablet (20 mg total) by mouth at bedtime., Disp: 90 tablet, Rfl: 3 .  montelukast (SINGULAIR) 10 MG tablet, Take 1 tablet (10 mg total) by mouth daily., Disp: 90 tablet, Rfl: 3  Patient Active Problem List   Diagnosis Date Noted  . Mild anemia 09/03/2019  . Vitamin D deficiency 09/03/2019  . Migraine aura without headache 01/01/2019  . Decreased GFR 01/01/2019  . CKD (chronic kidney disease) stage 3, GFR 30-59 ml/min (HCC) 12/04/2018  . PFO (patent foramen ovale) 07/31/2018  . Abnormal ECG 07/13/2018  . Bradycardia 07/13/2018  . Primary osteoarthritis of left knee 11/20/2017  . History of GI diverticular bleed 05/18/2017  .  Vitamin B12 deficiency anemia 05/18/2017  . Chronic allergic rhinitis 06/28/2016  . Mixed hyperlipidemia 06/28/2016  . Hiatal hernia 08/28/2014  . Hypothyroid 08/28/2014  . Headache 10/25/2013  . Hypertension 10/25/2013  . Numbness and tingling 10/25/2013  . Obesity 10/25/2013  . Visual disturbance 10/25/2013  . Thyroid nodule 09/19/2013  . Onychomycosis due to dermatophyte 08/21/2013    Past Surgical History:  Procedure Laterality Date  . CHOLECYSTECTOMY    . COLONOSCOPY  06/02/2014   cleared for 10 yrs- Dr Rayann Heman  . COLONOSCOPY N/A 04/07/2017   Procedure: COLONOSCOPY;  Surgeon: Lin Landsman, MD;  Location:  [facility] ENDOSCOPY;  Service: Gastroenterology;  Laterality: N/A;  . ESOPHAGEAL DILATION    . ESOPHAGOGASTRODUODENOSCOPY N/A 04/07/2017   Procedure: ESOPHAGOGASTRODUODENOSCOPY (EGD);  Surgeon: Lin Landsman, MD;  Location: [facility] ENDOSCOPY;  Service: Gastroenterology;  Laterality: N/A;  . HERNIA REPAIR  08/28/2014   estimate  . TONSILLECTOMY    . TUBAL LIGATION  1975   estimate  . UPPER GI ENDOSCOPY  06/02/2014    Family History  Problem Relation Age of Onset  . Kidney disease Mother   . Cancer Mother        brain  . Arthritis Mother   . Obesity Mother   . Varicose Veins Mother   . Heart disease Father   . Heart attack Father   . Hearing loss Father   . Hypertension Father   . Lupus Sister   . Diabetes Sister   . Thyroid disease Sister   . Rheum arthritis Sister   . Arthritis Sister   . Obesity Sister   . Diabetes Sister   . Arthritis Sister   . Cancer Sister   . Hearing loss Sister   . Intellectual disability Sister   . Learning disabilities Sister   . Multiple myeloma Sister   . Thyroid disease Sister   . Rheum arthritis Sister   . Arthritis Sister   . Cancer Sister   . Obesity Sister   . Breast cancer Maternal Aunt   . Miscarriages / [place] Daughter     Social History   Tobacco Use  . Smoking status: Never Smoker  . Smokeless tobacco: Never Used  . Tobacco comment: Never, but father and husband smoked  Vaping Use  . Vaping Use: Never used  Substance Use Topics  . Alcohol use: Not Currently    Alcohol/week: 0.0 standard drinks    Comment: on special occasions like New Years Eve  . Drug use: Never     Allergies  Allergen Reactions  . Lisinopril     cough  . Codeine Nausea Only and Nausea And Vomiting    Health Maintenance  Topic Date Due  . TETANUS/TDAP  10/13/2020  . INFLUENZA VACCINE  12/14/2020  . MAMMOGRAM  04/15/2021  . COLONOSCOPY (Pts 45-56yr Insurance coverage will need to be confirmed)  04/07/2022  . DEXA SCAN  04/15/2022  .  COVID-19 Vaccine  Completed  . Hepatitis C Screening  Completed  . PNA vac Low Risk Adult  Completed  . HPV VACCINES  Aged Out    Chart Review Today: I personally reviewed active problem list, medication list, allergies, family history, social history, health maintenance, notes from last encounter, lab results, imaging with the patient/caregiver today.   Review of Systems  Constitutional: Negative.   HENT: Negative.   Eyes: Negative.   Respiratory: Negative.   Cardiovascular: Negative.   Gastrointestinal: Negative.   Endocrine: Negative.   Genitourinary: Negative.   Musculoskeletal:  Negative.   Skin: Negative.   Allergic/Immunologic: Negative.   Neurological: Negative.   Hematological: Negative.   Psychiatric/Behavioral: Negative.   All other systems reviewed and are negative.    Objective:   Vitals:   08/20/20 1329  BP: 132/78  Pulse: 76  Resp: 16  Temp: 98.6 F (37 C)  SpO2: 97%  Weight: 159 lb 9.6 oz (72.4 kg)  Height: _0  (1.575 m)    Body mass index is 29.19 kg/m.  Physical Exam Vitals and nursing note reviewed.  Constitutional:      General: She is not in acute distress.    Appearance: Normal appearance. She is not ill-appearing, toxic-appearing or diaphoretic.  HENT:     Head: Normocephalic and atraumatic.     Right Ear: External ear normal.     Left Ear: External ear normal.  Eyes:     General:        Right eye: No discharge.        Left eye: No discharge.     Conjunctiva/sclera: Conjunctivae normal.  Cardiovascular:     Rate and Rhythm: Normal rate and regular rhythm.     Pulses: Normal pulses.     Heart sounds: Normal heart sounds.  Pulmonary:     Effort: Pulmonary effort is normal.     Breath sounds: Normal breath sounds.  Abdominal:     General: Bowel sounds are normal.     Palpations: Abdomen is soft.  Skin:    General: Skin is warm and dry.     Coloration: Skin is not jaundiced or pale.  Neurological:     Mental Status: She is  alert. Mental status is at baseline.  Psychiatric:        Mood and Affect: Mood normal.         Assessment & Plan:     ICD-10-CM   1. Mixed hyperlipidemia  E78.2 lovastatin (MEVACOR) 20 MG tablet    COMPLETE METABOLIC PANEL WITH GFR   Well-controlled at last visit compliant with meds, meds refilled, no myalgias or side effects  2. Chronic allergic rhinitis  J30.9 montelukast (SINGULAIR) 10 MG tablet    CBC with Differential/Platelet   Refill on her allergy medication, Flonase and Singulair no concerns or side effects  3. Hypothyroidism, unspecified type  E03.9 levothyroxine (EUTHYROX) 88 MCG tablet    CBC with Differential/Platelet    TSH   reviewed how to take meds and what other meds to avoid taking withiin 4 hours, recheck labs and adjust as needed  4. Anemia, unspecified type  D64.9 CBC with Differential/Platelet  5. Thrombocytopenia (HCC)  D69.6 CBC with Differential/Platelet  6. Stage 3a chronic kidney disease (HCC)  G14.15 COMPLETE METABOLIC PANEL WITH GFR  7. Essential hypertension  F73 COMPLETE METABOLIC PANEL WITH GFR   stable, well controlled, BP at goal today, continue losartan 100 mg daily      Return in about 6 months (around 02/19/2021).   Delsa Grana, PA-C 08/20/20 9:38 PM

## 2020-08-20 NOTE — Patient Instructions (Signed)
Health Maintenance  Topic Date Due  . Tetanus Vaccine  10/13/2020  . Flu Shot  12/14/2020  . Mammogram  04/15/2021  . Colon Cancer Screening  04/07/2022  . DEXA scan (bone density measurement)  04/15/2022  . COVID-19 Vaccine  Completed  .  Hepatitis C: One time screening is recommended by Center for Disease Control  (CDC) for  adults born from [birth year] through 1965.   Completed  . Pneumonia vaccines  Completed  . HPV Vaccine  Aged Out

## 2020-08-21 LAB — CBC WITH DIFFERENTIAL/PLATELET
Absolute Monocytes: 569 cells/uL (ref 200–950)
Basophils Absolute: 29 cells/uL (ref 0–200)
Basophils Relative: 0.4 %
Eosinophils Absolute: 29 cells/uL (ref 15–500)
Eosinophils Relative: 0.4 %
HCT: 31.6 % — ABNORMAL LOW (ref 35.0–45.0)
Hemoglobin: 10.3 g/dL — ABNORMAL LOW (ref 11.7–15.5)
Lymphs Abs: 1635 cells/uL (ref 850–3900)
MCH: 31.9 pg (ref 27.0–33.0)
MCHC: 32.6 g/dL (ref 32.0–36.0)
MCV: 97.8 fL (ref 80.0–100.0)
MPV: 13.2 fL — ABNORMAL HIGH (ref 7.5–12.5)
Monocytes Relative: 7.8 %
Neutro Abs: 5037 cells/uL (ref 1500–7800)
Neutrophils Relative %: 69 %
Platelets: 153 10*3/uL (ref 140–400)
RBC: 3.23 10*6/uL — ABNORMAL LOW (ref 3.80–5.10)
RDW: 12.8 % (ref 11.0–15.0)
Total Lymphocyte: 22.4 %
WBC: 7.3 10*3/uL (ref 3.8–10.8)

## 2020-08-21 LAB — COMPLETE METABOLIC PANEL WITH GFR
AG Ratio: 1.3 (calc) (ref 1.0–2.5)
ALT: 6 U/L (ref 6–29)
AST: 14 U/L (ref 10–35)
Albumin: 3.9 g/dL (ref 3.6–5.1)
Alkaline phosphatase (APISO): 55 U/L (ref 37–153)
BUN/Creatinine Ratio: 10 (calc) (ref 6–22)
BUN: 12 mg/dL (ref 7–25)
CO2: 27 mmol/L (ref 20–32)
Calcium: 9.1 mg/dL (ref 8.6–10.4)
Chloride: 107 mmol/L (ref 98–110)
Creat: 1.24 mg/dL — ABNORMAL HIGH (ref 0.60–0.93)
GFR, Est African American: 50 mL/min/{1.73_m2} — ABNORMAL LOW (ref >=60)
GFR, Est Non African American: 43 mL/min/{1.73_m2} — ABNORMAL LOW (ref >=60)
Globulin: 3 g/dL (calc) (ref 1.9–3.7)
Glucose, Bld: 99 mg/dL (ref 65–99)
Potassium: 4.4 mmol/L (ref 3.5–5.3)
Sodium: 140 mmol/L (ref 135–146)
Total Bilirubin: 0.6 mg/dL (ref 0.2–1.2)
Total Protein: 6.9 g/dL (ref 6.1–8.1)

## 2020-08-21 LAB — TSH: TSH: 0.41 mIU/L (ref 0.40–4.50)

## 2020-08-24 ENCOUNTER — Telehealth: Payer: Self-pay

## 2020-08-24 DIAGNOSIS — N1831 Chronic kidney disease, stage 3a: Secondary | ICD-10-CM

## 2020-08-24 NOTE — Telephone Encounter (Signed)
-----   Message from Delsa Grana, [place] sent at 08/24/2020 10:58 AM EDT ----- Please call pt with lab results  Anemia is worse than her normal - please ask if any bleeding, blood in stool, black or tarry stool?  She is established with Dr. Allen Norris for different abd dx and symptoms.  I think if we can get her take home stool cards x 3 that would be appropriate.  If positive she would need to f/up with Dr. Allen Norris.  If neg we will need to have her recheck cbc in about 2-3 weeks to make sure blood levels either improve or stay the same.    Thyroid labs were normal - continue same levothyroxine dose  Has she seen a kidney specialist yet?   Her kidney function is stage 3 chronic kidney disease - we may need her to consult with a nephrologist if she hasn't before - the kidneys sometimes can cause anemia, and there may be other meds that help preserve her kidney function (farxiga)   Maybe we can make a f/up appt in 2-3 weeks where we review all this, she brings back stool cards and we discuss labs/meds and referrals.  thanks

## 2020-09-03 ENCOUNTER — Telehealth: Payer: Self-pay

## 2020-09-03 ENCOUNTER — Ambulatory Visit: Payer: Medicare HMO

## 2020-09-03 NOTE — Telephone Encounter (Signed)
Copied from [place] [id number]. Topic: General - Other >> Sep 03, 2020  1:01 PM Mcneil, Ja-Kwan wrote: Reason for CRM: Pt would like to know if she is suppose to bring in the stool specimen. Pt requests call back.

## 2020-09-03 NOTE — Telephone Encounter (Signed)
Pt.notified

## 2020-09-04 LAB — FECAL OCCULT BLOOD, GUAIAC: Fecal Occult Blood: NEGATIVE

## 2020-09-09 ENCOUNTER — Other Ambulatory Visit: Payer: Self-pay

## 2020-09-09 ENCOUNTER — Ambulatory Visit (INDEPENDENT_AMBULATORY_CARE_PROVIDER_SITE_OTHER): Payer: Medicare HMO | Admitting: Family Medicine

## 2020-09-09 ENCOUNTER — Encounter: Payer: Self-pay | Admitting: Family Medicine

## 2020-09-09 VITALS — BP 122/70 | HR 66 | Temp 98.3°F | Resp 14 | Ht 62.0 in | Wt 155.7 lb

## 2020-09-09 DIAGNOSIS — F320 Major depressive disorder, single episode, mild: Secondary | ICD-10-CM

## 2020-09-09 DIAGNOSIS — R69 Illness, unspecified: Secondary | ICD-10-CM | POA: Diagnosis not present

## 2020-09-09 DIAGNOSIS — D649 Anemia, unspecified: Secondary | ICD-10-CM

## 2020-09-09 DIAGNOSIS — N1831 Chronic kidney disease, stage 3a: Secondary | ICD-10-CM | POA: Diagnosis not present

## 2020-09-09 NOTE — Progress Notes (Signed)
Name: Pamela Hendrix   MRN: [id number]    DOB: 06-Dec-1945   Date:09/09/2020       Progress Note  Chief Complaint  Patient presents with  . Follow-up    CKD and referral     Subjective:   Pamela Hendrix is a 75 y.o. female, presents to clinic for f/up on CKD and to discuss nephrology referral -referral was previously done an appointment is made in coming up  Reviewed last office visit and labs  Lab Results  Component Value Date   CREATININE 1.24 (H) 08/20/2020   Lab Results  Component Value Date   BUN 12 08/20/2020   Lab Results  Component Value Date   GFRNONAA 43 (L) 08/20/2020       Current Outpatient Medications:  .  alendronate (FOSAMAX) 70 MG tablet, Take 70 mg by mouth once a week., Disp: , Rfl:  .  aspirin EC 81 MG tablet, Take 81 mg by mouth daily., Disp: , Rfl:  .  cetirizine (ZYRTEC) 10 MG tablet, Take 10 mg by mouth daily. otc, Disp: , Rfl:  .  Cholecalciferol (VITAMIN D-3) 1000 units CAPS, Take 1 capsule by mouth daily., Disp: , Rfl:  .  clobetasol ointment (TEMOVATE) 0.05 %, Apply topically 2 (two) times daily as needed., Disp: , Rfl:  .  dicyclomine (BENTYL) 10 MG capsule, Take 1 capsule (10 mg total) by mouth 3 (three) times daily before meals., Disp: 90 capsule, Rfl: 3 .  fluticasone (FLONASE) 50 MCG/ACT nasal spray, Place 2 sprays into both nostrils daily. PRN, Disp: 16 g, Rfl: 2 .  Inulin (FIBER CHOICE PO), Take by mouth., Disp: , Rfl:  .  levothyroxine (EUTHYROX) 88 MCG tablet, Take 1 tablet (88 mcg total) by mouth daily. First thing in the morning on empty stomach, Disp: 90 tablet, Rfl: 3 .  losartan (COZAAR) 100 MG tablet, Take 100 mg by mouth daily., Disp: , Rfl:  .  lovastatin (MEVACOR) 20 MG tablet, Take 1 tablet (20 mg total) by mouth at bedtime., Disp: 90 tablet, Rfl: 3 .  montelukast (SINGULAIR) 10 MG tablet, Take 1 tablet (10 mg total) by mouth daily., Disp: 90 tablet, Rfl: 3  Patient Active Problem List   Diagnosis Date  Noted  . Mild anemia 09/03/2019  . Vitamin D deficiency 09/03/2019  . Migraine aura without headache 01/01/2019  . Decreased GFR 01/01/2019  . CKD (chronic kidney disease) stage 3, GFR 30-59 ml/min (HCC) 12/04/2018  . PFO (patent foramen ovale) 07/31/2018  . Abnormal ECG 07/13/2018  . Bradycardia 07/13/2018  . Primary osteoarthritis of left knee 11/20/2017  . History of GI diverticular bleed 05/18/2017  . Vitamin B12 deficiency anemia 05/18/2017  . Chronic allergic rhinitis 06/28/2016  . Mixed hyperlipidemia 06/28/2016  . Hiatal hernia 08/28/2014  . Hypothyroid 08/28/2014  . Headache 10/25/2013  . Hypertension 10/25/2013  . Numbness and tingling 10/25/2013  . Obesity 10/25/2013  . Visual disturbance 10/25/2013  . Thyroid nodule 09/19/2013  . Onychomycosis due to dermatophyte 08/21/2013    Past Surgical History:  Procedure Laterality Date  . CHOLECYSTECTOMY    . COLONOSCOPY  06/02/2014   cleared for 10 yrs- Dr Shelle Iron  . COLONOSCOPY N/A 04/07/2017   Procedure: COLONOSCOPY;  Surgeon: Toney Reil, MD;  Location: [facility] ENDOSCOPY;  Service: Gastroenterology;  Laterality: N/A;  . ESOPHAGEAL DILATION    . ESOPHAGOGASTRODUODENOSCOPY N/A 04/07/2017   Procedure: ESOPHAGOGASTRODUODENOSCOPY (EGD);  Surgeon: Toney Reil, MD;  Location: [facility] ENDOSCOPY;  Service: Gastroenterology;  Laterality: N/A;  . HERNIA REPAIR  08/28/2014   estimate  . TONSILLECTOMY    . TUBAL LIGATION  1975   estimate  . UPPER GI ENDOSCOPY  06/02/2014    Family History  Problem Relation Age of Onset  . Kidney disease Mother   . Cancer Mother        brain  . Arthritis Mother   . Obesity Mother   . Varicose Veins Mother   . Heart disease Father   . Heart attack Father   . Hearing loss Father   . Hypertension Father   . Lupus Sister   . Diabetes Sister   . Thyroid disease Sister   . Rheum arthritis Sister   . Arthritis Sister   . Obesity Sister   . Diabetes Sister   . Arthritis Sister    . Cancer Sister   . Hearing loss Sister   . Intellectual disability Sister   . Learning disabilities Sister   . Multiple myeloma Sister   . Thyroid disease Sister   . Rheum arthritis Sister   . Arthritis Sister   . Cancer Sister   . Obesity Sister   . Breast cancer Maternal Aunt   . Miscarriages / [place] Daughter     Social History   Tobacco Use  . Smoking status: Never Smoker  . Smokeless tobacco: Never Used  . Tobacco comment: Never, but father and husband smoked  Vaping Use  . Vaping Use: Never used  Substance Use Topics  . Alcohol use: Not Currently    Alcohol/week: 0.0 standard drinks    Comment: on special occasions like New Years Eve  . Drug use: Never     Allergies  Allergen Reactions  . Lisinopril     cough  . Codeine Nausea Only and Nausea And Vomiting    Health Maintenance  Topic Date Due  . TETANUS/TDAP  10/13/2020  . INFLUENZA VACCINE  12/14/2020  . MAMMOGRAM  04/15/2021  . COLONOSCOPY (Pts 45-11yrs Insurance coverage will need to be confirmed)  04/07/2022  . DEXA SCAN  04/15/2022  . COVID-19 Vaccine  Completed  . Hepatitis C Screening  Completed  . PNA vac Low Risk Adult  Completed  . HPV VACCINES  Aged Out    Chart Review Today: I personally reviewed active problem list, medication list, allergies, family history, social history, health maintenance, notes from last encounter, lab results, imaging with the patient/caregiver today.   Review of Systems  Constitutional: Negative.   HENT: Negative.   Eyes: Negative.   Respiratory: Negative.   Cardiovascular: Negative.   Gastrointestinal: Negative.   Endocrine: Negative.   Genitourinary: Negative.   Musculoskeletal: Negative.   Skin: Negative.   Allergic/Immunologic: Negative.   Neurological: Negative.   Hematological: Negative.   Psychiatric/Behavioral: Negative.   All other systems reviewed and are negative.    Objective:   Vitals:   09/09/20 1338  BP: 122/70  Pulse: 66   Resp: 14  Temp: 98.3 F (36.8 C)  SpO2: 98%  Weight: 155 lb 11.2 oz (70.6 kg)  Height: $Remove'5\' 2"'uUkRypC$  (1.575 m)    Body mass index is 28.48 kg/m.  Physical Exam Vitals and nursing note reviewed.  Constitutional:      General: She is not in acute distress.    Appearance: Normal appearance. She is not ill-appearing or toxic-appearing.  HENT:     Head: Normocephalic and atraumatic.  Eyes:     General:        Right eye: No  discharge.        Left eye: No discharge.     Conjunctiva/sclera: Conjunctivae normal.  Cardiovascular:     Rate and Rhythm: Normal rate and regular rhythm.     Pulses: Normal pulses.  Pulmonary:     Effort: Pulmonary effort is normal.     Breath sounds: Normal breath sounds.  Abdominal:     General: Bowel sounds are normal.     Palpations: Abdomen is soft.  Skin:    General: Skin is warm and dry.     Findings: No lesion or rash.  Neurological:     Mental Status: She is alert. Mental status is at baseline.  Psychiatric:        Mood and Affect: Mood normal.        Behavior: Behavior normal.       Depression screen [facility] 2/9 09/09/2020 08/20/2020 05/12/2020  Decreased Interest 0 1 0  Down, Depressed, Hopeless 0 1 0  PHQ - 2 Score 0 2 0  Altered sleeping 0 0 0  Tired, decreased energy 0 0 0  Change in appetite 0 0 0  Feeling bad or failure about yourself  0 0 0  Trouble concentrating 0 0 0  Moving slowly or fidgety/restless 0 0 0  Suicidal thoughts 0 0 0  PHQ-9 Score 0 2 0  Difficult doing work/chores Not difficult at all Not difficult at all Not difficult at all  Some recent data might be hidden     Assessment & Plan:     ICD-10-CM   1. Stage 3a chronic kidney disease ([place])  N18.31 CBC with Differential/Platelet  2. Anemia, unspecified type  D64.9 CBC with Differential/Platelet  3. Current mild episode of major depressive disorder, unspecified whether recurrent [facility])  F32.0    Nephrology appt coming up Monday 09/07/2020  Recheck CBC panel to  monitor anemia  PHQ reviewed and negative today  Kidney function has follow-up (slightly unclear why she was scheduled for this appt - since nephro f/up was arranged)  No follow-ups on file.   Delsa Grana, PA-C 09/09/20 1:57 PM

## 2020-09-09 NOTE — Patient Instructions (Signed)
Pamela Hendrix - newer medicine indicated for CKD     Chronic Kidney Disease, Adult Chronic kidney disease is when lasting damage happens to the kidneys slowly over a long time. The kidneys help to:  Make pee (urine).  Make hormones.  Keep the right amount of fluids and chemicals in the body. Most often, this disease does not go away. You must take steps to help keep the kidney damage from getting worse. If steps are not taken, the kidneys might stop working forever. What are the causes?  Diabetes.  High blood pressure.  Diseases that affect the heart and blood vessels.  Other kidney diseases.  Diseases of the body's disease-fighting system.  A problem with the flow of pee.  Infections of the organs that make pee, store it, and take it out of the body.  Swelling or irritation of your blood vessels. What increases the risk?  Getting older.  Having someone in your family who has kidney disease or kidney failure.  Having a disease caused by genes.  Taking medicines often that harm the kidneys.  Being near or having contact with harmful substances.  Being very overweight.  Using tobacco now or in the past. What are the signs or symptoms?  Feeling very tired.  Having a swollen face, legs, ankles, or feet.  Feeling like you may vomit or vomiting.  Not feeling hungry.  Being confused or not able to focus.  Twitches and cramps in the leg muscles or other muscles.  Dry, itchy skin.  A taste of metal in your mouth.  Making less pee, or making more pee.  Shortness of breath.  Trouble sleeping. You may also become anemic or get weak bones. Anemic means there is not enough red blood cells or hemoglobin in your blood. You may get symptoms slowly. You may not notice them until the kidney damage gets very bad. How is this treated? Often, there is no cure for this disease. Treatment can help with symptoms and help keep the disease from getting worse. You may need  to:  Avoid alcohol.  Avoid foods that are high in salt, potassium, phosphorous, and protein.  Take medicines for symptoms and to help control other conditions.  Have dialysis. This treatment gets harmful waste out of your body.  Treat other problems that cause your kidney disease or make it worse. Follow these instructions at home: Medicines  Take over-the-counter and prescription medicines only as told by your doctor.  Do not take any new medicines, vitamins, or supplements unless your doctor says it is okay. Lifestyle  Do not smoke or use any products that contain nicotine or tobacco. If you need help quitting, ask your doctor.  If you drink alcohol: ? Limit how much you use to:  0-1 drink a day for women who are not pregnant.  0-2 drinks a day for men. ? Know how much alcohol is in your drink. In the [place] one drink equals one 12 oz bottle of beer (355 mL), one 5 oz glass of wine (148 mL), or one 1 oz glass of hard liquor (44 mL).  Stay at a healthy weight. If you need help losing weight, ask your doctor.   General instructions  Follow instructions from your doctor about what you cannot eat or drink.  Track your blood pressure at home. Tell your doctor about any changes.  If you have diabetes, track your blood sugar.  Exercise at least 30 minutes a day, 5 days a week.  Keep your  shots (vaccinations) up to date.  Keep all follow-up visits.   Where to find more information  American Association of Kidney Patients: BombTimer.gl  National Kidney Foundation: www.kidney.[place]: https://mathis.com/  Life Options: www.lifeoptions.org  Kidney School: www.kidneyschool.org Contact a doctor if:  Your symptoms get worse.  You get new symptoms. Get help right away if:  You get symptoms of end-stage kidney disease. These include: ? Headaches. ? Losing feeling in your hands or feet. ? Easy bruising. ? Having hiccups often. ? Chest pain. ? Shortness  of breath. ? Lack of menstrual periods, in women.  You have a fever.  You make less pee than normal.  You have pain or you bleed when you pee or poop. These symptoms may be an emergency. Get help right away. Call your local emergency services (911 in the [place]  Do not wait to see if the symptoms will go away.  Do not drive yourself to the hospital. Summary  Chronic kidney disease is when lasting damage happens to the kidneys slowly over a long time.  Causes of this disease include diabetes and high blood pressure.  Often, there is no cure for this disease. Treatment can help symptoms and help keep the disease from getting worse.  Treatment may involve lifestyle changes, medicines, and dialysis. This information is not intended to replace advice given to you by your health care provider. Make sure you discuss any questions you have with your health care provider. Document Revised: 08/07/2019 Document Reviewed: 08/07/2019 Elsevier Patient Education  [place].

## 2020-09-21 ENCOUNTER — Other Ambulatory Visit (HOSPITAL_COMMUNITY): Payer: Self-pay | Admitting: Nephrology

## 2020-09-21 ENCOUNTER — Other Ambulatory Visit: Payer: Self-pay | Admitting: Nephrology

## 2020-09-21 DIAGNOSIS — N1832 Chronic kidney disease, stage 3b: Secondary | ICD-10-CM

## 2020-09-21 DIAGNOSIS — I10 Essential (primary) hypertension: Secondary | ICD-10-CM | POA: Diagnosis not present

## 2020-10-02 ENCOUNTER — Encounter: Payer: Self-pay | Admitting: Family Medicine

## 2020-10-06 ENCOUNTER — Other Ambulatory Visit: Payer: Self-pay

## 2020-10-06 ENCOUNTER — Ambulatory Visit
Admission: RE | Admit: 2020-10-06 | Discharge: 2020-10-06 | Disposition: A | Discharge status: Home / Self Care | Payer: Medicare HMO | Source: Ambulatory Visit | Attending: Nephrology | Admitting: Nephrology

## 2020-10-06 DIAGNOSIS — N1832 Chronic kidney disease, stage 3b: Secondary | ICD-10-CM

## 2020-10-20 DIAGNOSIS — N1831 Chronic kidney disease, stage 3a: Secondary | ICD-10-CM | POA: Diagnosis not present

## 2020-10-20 DIAGNOSIS — I10 Essential (primary) hypertension: Secondary | ICD-10-CM | POA: Diagnosis not present

## 2020-10-20 DIAGNOSIS — D631 Anemia in chronic kidney disease: Secondary | ICD-10-CM | POA: Diagnosis not present

## 2020-10-20 DIAGNOSIS — N2581 Secondary hyperparathyroidism of renal origin: Secondary | ICD-10-CM | POA: Diagnosis not present

## 2020-10-20 DIAGNOSIS — N1832 Chronic kidney disease, stage 3b: Secondary | ICD-10-CM | POA: Diagnosis not present

## 2020-11-15 ENCOUNTER — Other Ambulatory Visit: Payer: Self-pay | Admitting: Gastroenterology

## 2020-11-26 DIAGNOSIS — N1832 Chronic kidney disease, stage 3b: Secondary | ICD-10-CM | POA: Diagnosis not present

## 2020-12-07 DIAGNOSIS — D2272 Melanocytic nevi of left lower limb, including hip: Secondary | ICD-10-CM | POA: Diagnosis not present

## 2020-12-07 DIAGNOSIS — D225 Melanocytic nevi of trunk: Secondary | ICD-10-CM | POA: Diagnosis not present

## 2020-12-07 DIAGNOSIS — D2271 Melanocytic nevi of right lower limb, including hip: Secondary | ICD-10-CM | POA: Diagnosis not present

## 2020-12-07 DIAGNOSIS — L820 Inflamed seborrheic keratosis: Secondary | ICD-10-CM | POA: Diagnosis not present

## 2020-12-07 DIAGNOSIS — L538 Other specified erythematous conditions: Secondary | ICD-10-CM | POA: Diagnosis not present

## 2020-12-07 DIAGNOSIS — D2262 Melanocytic nevi of left upper limb, including shoulder: Secondary | ICD-10-CM | POA: Diagnosis not present

## 2020-12-07 DIAGNOSIS — L821 Other seborrheic keratosis: Secondary | ICD-10-CM | POA: Diagnosis not present

## 2020-12-07 DIAGNOSIS — D2261 Melanocytic nevi of right upper limb, including shoulder: Secondary | ICD-10-CM | POA: Diagnosis not present

## 2020-12-25 DIAGNOSIS — Z7982 Long term (current) use of aspirin: Secondary | ICD-10-CM | POA: Diagnosis not present

## 2020-12-25 DIAGNOSIS — I10 Essential (primary) hypertension: Secondary | ICD-10-CM | POA: Diagnosis not present

## 2020-12-25 DIAGNOSIS — Z7983 Long term (current) use of bisphosphonates: Secondary | ICD-10-CM | POA: Diagnosis not present

## 2020-12-25 DIAGNOSIS — M810 Age-related osteoporosis without current pathological fracture: Secondary | ICD-10-CM | POA: Diagnosis not present

## 2020-12-25 DIAGNOSIS — R32 Unspecified urinary incontinence: Secondary | ICD-10-CM | POA: Diagnosis not present

## 2020-12-25 DIAGNOSIS — E039 Hypothyroidism, unspecified: Secondary | ICD-10-CM | POA: Diagnosis not present

## 2020-12-25 DIAGNOSIS — E785 Hyperlipidemia, unspecified: Secondary | ICD-10-CM | POA: Diagnosis not present

## 2020-12-25 DIAGNOSIS — J309 Allergic rhinitis, unspecified: Secondary | ICD-10-CM | POA: Diagnosis not present

## 2020-12-25 DIAGNOSIS — K589 Irritable bowel syndrome without diarrhea: Secondary | ICD-10-CM | POA: Diagnosis not present

## 2020-12-25 DIAGNOSIS — K219 Gastro-esophageal reflux disease without esophagitis: Secondary | ICD-10-CM | POA: Diagnosis not present

## 2020-12-28 DIAGNOSIS — Z20828 Contact with and (suspected) exposure to other viral communicable diseases: Secondary | ICD-10-CM | POA: Diagnosis not present

## 2021-01-07 DIAGNOSIS — Z20828 Contact with and (suspected) exposure to other viral communicable diseases: Secondary | ICD-10-CM | POA: Diagnosis not present

## 2021-01-29 ENCOUNTER — Telehealth: Payer: Self-pay

## 2021-01-29 NOTE — Telephone Encounter (Signed)
Copied from [place] [id number]. Topic: General - Other >> Jan 28, 2021  4:45 PM Pawlus, Brayton Layman A wrote: Reason for CRM: Pt stated she got her COVID vaccine at [facility] but it was not uploaded to her MyChart correctly, pt wanted her COVID vaccines to be updated correctly on her MyChart. Please advise what the pt can do.

## 2021-01-29 NOTE — Telephone Encounter (Signed)
Documentation fixed

## 2021-02-08 DIAGNOSIS — I10 Essential (primary) hypertension: Secondary | ICD-10-CM | POA: Diagnosis not present

## 2021-02-08 DIAGNOSIS — Q211 Atrial septal defect: Secondary | ICD-10-CM | POA: Diagnosis not present

## 2021-02-08 DIAGNOSIS — R9431 Abnormal electrocardiogram [ECG] [EKG]: Secondary | ICD-10-CM | POA: Diagnosis not present

## 2021-02-08 DIAGNOSIS — N1831 Chronic kidney disease, stage 3a: Secondary | ICD-10-CM | POA: Diagnosis not present

## 2021-02-08 DIAGNOSIS — R001 Bradycardia, unspecified: Secondary | ICD-10-CM | POA: Diagnosis not present

## 2021-02-08 DIAGNOSIS — E782 Mixed hyperlipidemia: Secondary | ICD-10-CM | POA: Diagnosis not present

## 2021-02-14 DIAGNOSIS — N390 Urinary tract infection, site not specified: Secondary | ICD-10-CM | POA: Diagnosis not present

## 2021-02-14 DIAGNOSIS — R69 Illness, unspecified: Secondary | ICD-10-CM | POA: Diagnosis not present

## 2021-02-15 DIAGNOSIS — N390 Urinary tract infection, site not specified: Secondary | ICD-10-CM | POA: Diagnosis not present

## 2021-02-15 DIAGNOSIS — N1831 Chronic kidney disease, stage 3a: Secondary | ICD-10-CM | POA: Diagnosis not present

## 2021-02-18 ENCOUNTER — Ambulatory Visit: Payer: Medicare HMO | Admitting: Family Medicine

## 2021-02-23 ENCOUNTER — Ambulatory Visit: Payer: Medicare HMO

## 2021-03-08 ENCOUNTER — Ambulatory Visit: Payer: Medicare HMO | Admitting: Family Medicine

## 2021-03-10 ENCOUNTER — Ambulatory Visit (INDEPENDENT_AMBULATORY_CARE_PROVIDER_SITE_OTHER): Payer: Medicare HMO | Admitting: Family Medicine

## 2021-03-10 ENCOUNTER — Other Ambulatory Visit: Payer: Self-pay

## 2021-03-10 ENCOUNTER — Encounter: Payer: Self-pay | Admitting: Family Medicine

## 2021-03-10 VITALS — BP 122/64 | HR 79 | Temp 98.7°F | Resp 16 | Ht 62.0 in | Wt 158.4 lb

## 2021-03-10 DIAGNOSIS — E782 Mixed hyperlipidemia: Secondary | ICD-10-CM | POA: Diagnosis not present

## 2021-03-10 DIAGNOSIS — I10 Essential (primary) hypertension: Secondary | ICD-10-CM | POA: Diagnosis not present

## 2021-03-10 DIAGNOSIS — Q2112 Patent foramen ovale: Secondary | ICD-10-CM | POA: Diagnosis not present

## 2021-03-10 DIAGNOSIS — E041 Nontoxic single thyroid nodule: Secondary | ICD-10-CM

## 2021-03-10 DIAGNOSIS — E039 Hypothyroidism, unspecified: Secondary | ICD-10-CM

## 2021-03-10 DIAGNOSIS — J309 Allergic rhinitis, unspecified: Secondary | ICD-10-CM | POA: Diagnosis not present

## 2021-03-10 DIAGNOSIS — N1831 Chronic kidney disease, stage 3a: Secondary | ICD-10-CM | POA: Diagnosis not present

## 2021-03-10 NOTE — Assessment & Plan Note (Signed)
Doing well on current regimen, no changes made today. 

## 2021-03-10 NOTE — Assessment & Plan Note (Signed)
Recheck labs and adjust as needed.

## 2021-03-10 NOTE — Assessment & Plan Note (Signed)
Tolerant of statin, continue. Recheck labs. 

## 2021-03-10 NOTE — Progress Notes (Signed)
    SUBJECTIVE:   CHIEF COMPLAINT / HPI:   Hypertension: - Medications: losartan - Compliance: good - Checking BP at home: no - Denies any medication SEs, or symptoms of hypotension - some LE edema  Hypothyroidism - Medications: Synthroid 66mcg - Current symptoms:  none - Denies diarrhea, nervousness, palpitations, and weight changes - Symptoms have been well-controlled  HLD - medications: lovastatin - compliance: good - medication SEs: none  CHRONIC KIDNEY DISEASE - follows with Nephrology, last visit 10/2020. Has upcoming appt tomorrow.  - with HTN, secondary hyperparathyroidism, anemia of chronic disease - negative SPEP/UPEP/ANA - [place] with increased echogenicity CKD status: stable Medications renally dose: yes Previous renal evaluation: yes Pneumovax:  Up to Date Influenza Vaccine:  Up to Date  PFO - found on bubble study 2020 without shunting. Follows with Cardiology.  Osteoporosis - T score -2.6 04/2020. On fosamax weekly, started about 6 months ago.   IBS - doing well on bentyl.  OBJECTIVE:   BP 122/64   Pulse 79   Temp 98.7 F (37.1 C) (Oral)   Resp 16   Ht 5\' 2"  (1.575 m)   Wt 158 lb 6.4 oz (71.8 kg)   SpO2 98%   BMI 28.97 kg/m   Gen: well appearing, in NAD Card: RRR Lungs: CTAB Ext: WWP, trace LE edema   ASSESSMENT/PLAN:   Hypertension Doing well on current regimen, no changes made today.  PFO (patent foramen ovale) Continue to follow with Cardiology  Chronic allergic rhinitis Doing well on current regimen, no changes made today.  Hypothyroid Recheck labs and adjust as needed.  CKD (chronic kidney disease) stage 3, GFR 30-59 ml/min Recheck labs today. Continue to follow with Nephro.  Mixed hyperlipidemia Tolerant of statin, continue. Recheck labs.   F/u in 6 mths.   Myles Gip, DO

## 2021-03-10 NOTE — Assessment & Plan Note (Signed)
Recheck labs today. Continue to follow with Nephro.

## 2021-03-10 NOTE — Patient Instructions (Signed)
It was great to see you!  Our plans for today:  - No change to your medications.  - Let us know once you get your shingles vaccine so we can update your chart.   We are checking some labs today, we will release these results to your MyChart.  Take care and seek immediate care sooner if you develop any concerns.   Dr. Ky Barban

## 2021-03-10 NOTE — Assessment & Plan Note (Signed)
Continue to follow with Cardiology

## 2021-03-11 DIAGNOSIS — N1832 Chronic kidney disease, stage 3b: Secondary | ICD-10-CM | POA: Diagnosis not present

## 2021-03-11 DIAGNOSIS — D631 Anemia in chronic kidney disease: Secondary | ICD-10-CM | POA: Diagnosis not present

## 2021-03-11 DIAGNOSIS — N2581 Secondary hyperparathyroidism of renal origin: Secondary | ICD-10-CM | POA: Diagnosis not present

## 2021-03-11 DIAGNOSIS — I10 Essential (primary) hypertension: Secondary | ICD-10-CM | POA: Diagnosis not present

## 2021-03-11 LAB — TSH: TSH: 1.05 mIU/L (ref 0.40–4.50)

## 2021-03-11 LAB — BASIC METABOLIC PANEL
BUN/Creatinine Ratio: 15 (calc) (ref 6–22)
BUN: 19 mg/dL (ref 7–25)
CO2: 27 mmol/L (ref 20–32)
Calcium: 8.9 mg/dL (ref 8.6–10.4)
Chloride: 105 mmol/L (ref 98–110)
Creat: 1.25 mg/dL — ABNORMAL HIGH (ref 0.60–1.00)
Glucose, Bld: 90 mg/dL (ref 65–99)
Potassium: 4.4 mmol/L (ref 3.5–5.3)
Sodium: 139 mmol/L (ref 135–146)

## 2021-03-11 LAB — LIPID PANEL
Cholesterol: 137 mg/dL (ref <200)
HDL: 57 mg/dL (ref >=50)
LDL Cholesterol (Calc): 62 mg/dL (calc)
Non-HDL Cholesterol (Calc): 80 mg/dL (calc) (ref <130)
Total CHOL/HDL Ratio: 2.4 (calc) (ref <5.0)
Triglycerides: 101 mg/dL (ref <150)

## 2021-03-15 ENCOUNTER — Other Ambulatory Visit: Payer: Self-pay | Admitting: Family Medicine

## 2021-03-15 DIAGNOSIS — Z1231 Encounter for screening mammogram for malignant neoplasm of breast: Secondary | ICD-10-CM

## 2021-03-25 ENCOUNTER — Ambulatory Visit (INDEPENDENT_AMBULATORY_CARE_PROVIDER_SITE_OTHER): Payer: Medicare HMO | Admitting: Nurse Practitioner

## 2021-03-25 ENCOUNTER — Other Ambulatory Visit: Payer: Self-pay

## 2021-03-25 ENCOUNTER — Encounter: Payer: Self-pay | Admitting: Nurse Practitioner

## 2021-03-25 VITALS — BP 120/82 | HR 93 | Temp 98.0°F | Resp 16 | Ht 62.0 in | Wt 160.1 lb

## 2021-03-25 DIAGNOSIS — N3000 Acute cystitis without hematuria: Secondary | ICD-10-CM

## 2021-03-25 DIAGNOSIS — M545 Low back pain, unspecified: Secondary | ICD-10-CM | POA: Diagnosis not present

## 2021-03-25 DIAGNOSIS — M79605 Pain in left leg: Secondary | ICD-10-CM

## 2021-03-25 DIAGNOSIS — M79604 Pain in right leg: Secondary | ICD-10-CM | POA: Diagnosis not present

## 2021-03-25 LAB — POCT URINALYSIS DIPSTICK
Bilirubin, UA: NEGATIVE
Glucose, UA: NEGATIVE
Ketones, UA: NEGATIVE
Nitrite, UA: NEGATIVE
Protein, UA: POSITIVE — AB
Spec Grav, UA: 1.02 (ref 1.010–1.025)
Urobilinogen, UA: 0.2 E.U./dL
pH, UA: 5 (ref 5.0–8.0)

## 2021-03-25 MED ORDER — PREDNISONE 10 MG PO TABS: ORAL_TABLET | ORAL | 0 refills | Status: DC

## 2021-03-25 MED ORDER — CEPHALEXIN 500 MG PO CAPS: 500.0000 mg | ORAL_CAPSULE | Freq: Two times a day (BID) | ORAL | 0 refills | Status: AC

## 2021-03-25 MED ORDER — METHOCARBAMOL 500 MG PO TABS: 500.0000 mg | ORAL_TABLET | Freq: Two times a day (BID) | ORAL | 0 refills | Status: DC | PRN

## 2021-03-25 NOTE — Progress Notes (Signed)
BP 120/82   Pulse 93   Temp 98 F (36.7 C) (Oral)   Resp 16   Ht 5\' 2"  (1.575 m)   Wt 160 lb 1.6 oz (72.6 kg)   SpO2 98%   BMI 29.28 kg/m    Subjective:    Patient ID: Pamela Hendrix, female    DOB: 05-Aug-1945, 75 y.o.   MRN: [id number]  HPI: Pamela Hendrix is a 75 y.o. female, here with husband  Chief Complaint  Patient presents with   Back Pain   Low back pain:  She says she has had low back pain since yesterday.  She says she woke up with the pain.  She describes the pain as cramping and radiates down her legs.  She denies any known trauma.  She says that the day before the pain started she did change out her summer clothes for her fall clothes.  She denies any fever, numbness, tingling or incontinence.  She does have decrease ROM.  There is tenderness noted to lower back but no CVA tenderness, and no spine tenderness. She denies any urinary complaints but does report that she had a UTI last month.  She does not remember which antibiotic she was on.  Discussed differentials with patient.  Will get urine. Treat if necessary, suspect musculoskeletal pain, will give round of steroids and muscle relaxer.  She will include heat therapy at home.  She will return Monday if no improvement.    UTI: She says she had a UTI last month.  Will check urine due to low back pain.  She denies any dysuria.  She does have urgency in the morning but she does not feel that it is unusual.  She denies any vaginal discharge or new sexual partners. Will send urine culture.  Start patient on Keflex.     Relevant past medical, surgical, family and social history reviewed and updated as indicated. Interim medical history since our last visit reviewed. Allergies and medications reviewed and updated.  Review of Systems  Constitutional: Negative for fever or weight change.  Respiratory: Negative for cough and shortness of breath.   Cardiovascular: Negative for chest pain or palpitations.   Gastrointestinal: Negative for abdominal pain, no bowel changes.  Musculoskeletal: Negative for gait problem or joint swelling. Positive for low back pain Skin: Negative for rash.  Neurological: Negative for dizziness or headache.  No other specific complaints in a complete review of systems (except as listed in HPI above).      Objective:    BP 120/82   Pulse 93   Temp 98 F (36.7 C) (Oral)   Resp 16   Ht 5\' 2"  (1.575 m)   Wt 160 lb 1.6 oz (72.6 kg)   SpO2 98%   BMI 29.28 kg/m   Wt Readings from Last 3 Encounters:  03/25/21 160 lb 1.6 oz (72.6 kg)  03/10/21 158 lb 6.4 oz (71.8 kg)  09/09/20 155 lb 11.2 oz (70.6 kg)    Physical Exam  Constitutional: Patient appears well-developed and well-nourished. No distress.  HEENT: head atraumatic, normocephalic, pupils equal and reactive to light, neck supple Cardiovascular: Normal rate, regular rhythm and normal heart sounds.  No murmur heard. No BLE edema. Pulmonary/Chest: Effort normal and breath sounds normal. No respiratory distress. Abdominal: Soft.  There is no tenderness. Musculoskeletal: tenderness noted in lower back, no CVA tenderness Psychiatric: Patient has a normal mood and affect. behavior is normal. Judgment and thought content normal.   Results for orders  placed or performed in visit on 03/25/21  POCT urinalysis dipstick  Result Value Ref Range   Color, UA yellow    Clarity, UA clear    Glucose, UA Negative Negative   Bilirubin, UA negative    Ketones, UA negative    Spec Grav, UA 1.020 1.010 - 1.025   Blood, UA large    pH, UA 5.0 5.0 - 8.0   Protein, UA Positive (A) Negative   Urobilinogen, UA 0.2 0.2 or 1.0 E.U./dL   Nitrite, UA negative    Leukocytes, UA Large (3+) (A) Negative   Appearance clear    Odor none       Assessment & Plan:   1. Low back pain radiating to both legs -continue taking tylenol for pain - POCT urinalysis dipstick - methocarbamol (ROBAXIN) 500 MG tablet; Take 1 tablet (500 mg  total) by mouth 2 (two) times daily as needed for muscle spasms.  Dispense: 20 tablet; Refill: 0 - cephALEXin (KEFLEX) 500 MG capsule; Take 1 capsule (500 mg total) by mouth 2 (two) times daily for 5 days.  Dispense: 10 capsule; Refill: 0 - Urine Culture - predniSONE (DELTASONE) 10 MG tablet; Day 1 take 6 pills, day 2 take 5 pills, day 3 take 4 pills, day 4 take 3 pills, day 5 take 2 pills, day 6 take 1 pill  Dispense: 21 tablet; Refill: 0   2. Acute cystitis without hematuria - POCT urinalysis dipstick - cephALEXin (KEFLEX) 500 MG capsule; Take 1 capsule (500 mg total) by mouth 2 (two) times daily for 5 days.  Dispense: 10 capsule; Refill: 0 - Urine Culture  Follow up plan: Return in about 4 days (around 03/29/2021) for if no improvement.

## 2021-03-27 LAB — URINE CULTURE
MICRO NUMBER:: 12621714
SPECIMEN QUALITY:: ADEQUATE

## 2021-04-15 ENCOUNTER — Ambulatory Visit (INDEPENDENT_AMBULATORY_CARE_PROVIDER_SITE_OTHER): Payer: Medicare HMO

## 2021-04-15 DIAGNOSIS — Z0000 Encounter for general adult medical examination without abnormal findings: Secondary | ICD-10-CM | POA: Diagnosis not present

## 2021-04-15 NOTE — Progress Notes (Signed)
Subjective:   Pamela Hendrix is a 75 y.o. female who presents for Medicare Annual (Subsequent) preventive examination.  Virtual Visit via Telephone Note  I connected with  Pamela Hendrix on 04/15/21 at 10:00 AM EST by telephone and verified that I am speaking with the correct person using two identifiers.  Location: Patient: home Provider: [place] Persons participating in the virtual visit: [place]   I discussed the limitations, risks, security and privacy concerns of performing an evaluation and management service by telephone and the availability of in person appointments. The patient expressed understanding and agreed to proceed.  Interactive audio and video telecommunications were attempted between this nurse and patient, however failed, due to patient having technical difficulties OR patient did not have access to video capability.  We continued and completed visit with audio only.  Some vital signs may be absent or patient reported.   Clemetine Marker, LPN   Review of Systems     Cardiac Risk Factors include: advanced age (>13mn, >>20women);dyslipidemia;hypertension;obesity (BMI >30kg/m2)     Objective:    There were no vitals filed for this visit. There is no height or weight on file to calculate BMI.  Advanced Directives 04/15/2021 02/20/2020 02/19/2019 09/04/2017 04/06/2017 04/06/2017  Does Patient Have a Medical Advance Directive? _0  Yes  Type of AParamedicof [place]Living will [place]Living will [place]West [place]Living will [place]Living will HWet Camp VillageLiving will HRayLiving will  Does patient want to make changes to medical advance directive? - - - - No - Patient declined -  Copy of [place]in Chart? Yes - validated most recent copy scanned in chart (See row information) No  - copy requested No - copy requested No - copy requested No - copy requested No - copy requested    Current Medications (verified) Outpatient Encounter Medications as of 04/15/2021  Medication Sig   alendronate (FOSAMAX) 70 MG tablet Take 70 mg by mouth once a week.   aspirin EC 81 MG tablet Take 81 mg by mouth daily.   cetirizine (ZYRTEC) 10 MG tablet Take 10 mg by mouth daily. otc   Cholecalciferol (VITAMIN D-3) 1000 units CAPS Take 1 capsule by mouth daily.   clobetasol ointment (TEMOVATE) 0.05 % Apply topically 2 (two) times daily as needed.   dicyclomine (BENTYL) 10 MG capsule TAKE 1 CAPSULE BY MOUTH THREE TIMES DAILY BEFORE MEAL(S)   fluticasone (FLONASE) 50 MCG/ACT nasal spray Place 2 sprays into both nostrils daily. PRN   Inulin (FIBER CHOICE PO) Take by mouth.   levothyroxine (EUTHYROX) 88 MCG tablet Take 1 tablet (88 mcg total) by mouth daily. First thing in the morning on empty stomach   losartan (COZAAR) 100 MG tablet Take 100 mg by mouth daily.   lovastatin (MEVACOR) 20 MG tablet Take 1 tablet (20 mg total) by mouth at bedtime.   montelukast (SINGULAIR) 10 MG tablet Take 1 tablet (10 mg total) by mouth daily.   methocarbamol (ROBAXIN) 500 MG tablet Take 1 tablet (500 mg total) by mouth 2 (two) times daily as needed for muscle spasms. (Patient not taking: Reported on 04/15/2021)   [DISCONTINUED] predniSONE (DELTASONE) 10 MG tablet Day 1 take 6 pills, day 2 take 5 pills, day 3 take 4 pills, day 4 take 3 pills, day 5 take 2 pills, day 6 take 1 pill   No facility-administered encounter medications on file  as of 04/15/2021.    Allergies (verified) Lisinopril and Codeine   History: Past Medical History:  Diagnosis Date   Allergy    Anemia 2000   estimate   Blood transfusion without reported diagnosis 04/07/2017   estimate   Bursitis    CKD (chronic kidney disease) stage 3, GFR 30-59 ml/min (HCC)    Diverticulosis    GERD (gastroesophageal reflux disease)    Hiatal hernia     Hypertension    Migraine with aura    states she doesn't have a headache, just the aura   Mixed hyperlipidemia    Osteoarthritis of knee    Thyroid disease    Past Surgical History:  Procedure Laterality Date   CHOLECYSTECTOMY     COLONOSCOPY  06/02/2014   cleared for 10 yrs- Dr Rayann Heman   COLONOSCOPY N/A 04/07/2017   Procedure: COLONOSCOPY;  Surgeon: Lin Landsman, MD;  Location: [place];  Service: Gastroenterology;  Laterality: N/A;   ESOPHAGEAL DILATION     ESOPHAGOGASTRODUODENOSCOPY N/A 04/07/2017   Procedure: ESOPHAGOGASTRODUODENOSCOPY (EGD);  Surgeon: Lin Landsman, MD;  [facility] ENDOSCOPY;  Service: Gastroenterology;  Laterality: N/A;   HERNIA REPAIR  08/28/2014   estimate   TONSILLECTOMY     TUBAL LIGATION  1975   estimate   UPPER GI ENDOSCOPY  06/02/2014   Family History  Problem Relation Age of Onset   Kidney disease Mother    Cancer Mother        brain   Arthritis Mother    Obesity Mother    Varicose Veins Mother    Heart disease Father    Heart attack Father    Hearing loss Father    Hypertension Father    Lupus Sister    Diabetes Sister    Thyroid disease Sister    Rheum arthritis Sister    Arthritis Sister    Obesity Sister    Diabetes Sister    Arthritis Sister    Cancer Sister    Hearing loss Sister    Intellectual disability Sister    Learning disabilities Sister    Multiple myeloma Sister    Thyroid disease Sister    Rheum arthritis Sister    Arthritis Sister    Cancer Sister    Obesity Sister    Breast cancer Maternal Aunt    Miscarriages / [place] Daughter    Social History   Socioeconomic History   Marital status: Married    Spouse name: Doren Custard   Number of children: 1   Years of education: some college   Highest education level: 12th grade  Occupational History   Occupation: Retired  Tobacco Use   Smoking status: Never   Smokeless tobacco: Never   Tobacco comments:    Never, but father and husband  smoked  Vaping Use   Vaping Use: Never used  Substance and Sexual Activity   Alcohol use: Not Currently    Alcohol/week: 0.0 standard drinks    Comment: on special occasions like New Years Eve   Drug use: Never   Sexual activity: Not Currently    Birth control/protection: Post-menopausal, Surgical    Comment: tubes tied in my 46's, now post-menopausal  Other Topics Concern   Not on file  Social History Narrative   Not on file   Social Determinants of Health   Financial Resource Strain: Low Risk    Difficulty of Paying Living Expenses: Not hard at all  Food Insecurity: No Food Insecurity   Worried About Running  Out of Food in the Last Year: Never true   Ran Out of Food in the Last Year: Never true  Transportation Needs: No Transportation Needs   Lack of Transportation (Medical): No   Lack of Transportation (Non-Medical): No  Physical Activity: Sufficiently Active   Days of Exercise per Week: 7 days   Minutes of Exercise per Session: 30 min  Stress: No Stress Concern Present   Feeling of Stress : Only a little  Social Connections: Moderately Integrated   Frequency of Communication with Friends and Family: More than three times a week   Frequency of Social Gatherings with Friends and Family: More than three times a week   Attends Religious Services: More than 4 times per year   Active Member of [employer] or Organizations: No   Attends Music therapist: Never   Marital Status: Married    Tobacco Counseling Counseling given: Not Answered Tobacco comments: Never, but father and husband smoked   Clinical Intake:  Pre-visit preparation completed: Yes  Pain : No/denies pain     Nutritional Risks: None Diabetes: No  How often do you need to have someone help you when you read instructions, pamphlets, or other written materials from your doctor or pharmacy?: 1 - Never   Interpreter Needed?: No  Information entered by :: Clemetine Marker LPN   Activities of  Daily Living In your present state of health, do you have any difficulty performing the following activities: 04/15/2021 03/25/2021  Hearing? N N  Vision? N N  Difficulty concentrating or making decisions? N N  Walking or climbing stairs? N N  Dressing or bathing? N N  Doing errands, shopping? N N  Preparing Food and eating ? N -  Using the Toilet? N -  In the past six months, have you accidently leaked urine? N -  Do you have problems with loss of bowel control? Y -  Managing your Medications? N -  Managing your Finances? N -  Housekeeping or managing your Housekeeping? N -  Some recent data might be hidden    Patient Care Team: Delsa Grana, PA-C as PCP - General (Family Medicine) Gabriel Carina, Betsey Holiday, MD as Physician Assistant (Endocrinology) Corey Skains, MD as Consulting Physician (Cardiology) Lin Landsman, MD as Consulting Physician (Gastroenterology)  Indicate any recent Medical Services you may have received from other than [facility] providers in the past year (date may be approximate).     Assessment:   This is a routine wellness examination for [place].  Hearing/Vision screen Hearing Screening - Comments:: Pt denies hearing difficulty  Vision Screening - Comments:: [facility]  Dietary issues and exercise activities discussed: Current Exercise Habits: Home exercise routine, Type of exercise: stretching;calisthenics, Time (Minutes): 30, Frequency (Times/Week): 7, Weekly Exercise (Minutes/Week): 210, Intensity: Mild, Exercise limited by: None identified   Goals Addressed             This Visit's Progress    DIET - INCREASE WATER INTAKE   Not on track    Recommend to drink at least 6-8 8oz glasses of water per day.     Increase physical activity   On track    Recommend increasing physical activity to at least 3 days per week       Depression Screen PHQ 2/9 Scores 04/15/2021 03/25/2021 03/10/2021 09/09/2020 08/20/2020  05/12/2020 03/30/2020  PHQ - 2 Score 2 2 0 0 2 0 2  PHQ- 9 Score 4 2 0 0  2 0 -    Fall Risk Fall Risk  04/15/2021 03/25/2021 03/10/2021 09/09/2020 08/20/2020  Falls in the past year? 0 0 0 0 0  Comment - - - - -  Number falls in past yr: 0 0 0 0 0  Injury with Fall? 0 0 0 0 0  Risk for fall due to : No Fall Risks - No Fall Risks - -  Risk for fall due to: Comment - - - - -  Follow up Falls prevention discussed Falls evaluation completed Falls prevention discussed - -    FALL RISK PREVENTION PERTAINING TO THE HOME:  Any stairs in or around the home? Yes  If so, are there any without handrails? No  Home free of loose throw rugs in walkways, pet beds, electrical cords, etc? Yes  Adequate lighting in your home to reduce risk of falls? Yes   ASSISTIVE DEVICES UTILIZED TO PREVENT FALLS:  Life alert? No  Use of a cane, walker or w/c? No  Grab bars in the bathroom? No  Shower chair or bench in shower? Yes  Elevated toilet seat or a handicapped toilet? No   TIMED UP AND GO:  Was the test performed? No . Telephonic visit.   Cognitive Function: Normal cognitive status assessed by direct observation by this Nurse Health Advisor. No abnormalities found.       6CIT Screen 02/20/2020 02/19/2019 09/04/2017  What Year? 0 points 0 points 0 points  What month? 0 points 0 points 0 points  What time? 0 points 0 points 0 points  Count back from 20 0 points 0 points 0 points  Months in reverse 0 points 0 points 0 points  Repeat phrase 2 points 0 points 0 points  Total Score 2 0 0    Immunizations Immunization History  Administered Date(s) Administered   Fluad Quad(high Dose 65+) 02/16/2019, 02/20/2020   H1N1 03/01/2017   Influenza, High Dose Seasonal PF 03/01/2017, 01/22/2018   Influenza,inj,Quad PF,6+ Mos 04/02/2015   Influenza-Unspecified 02/25/2021   PFIZER(Purple Top)SARS-COV-2 Vaccination 07/08/2019, 07/29/2019, 02/10/2020, 09/07/2020   Pneumococcal Conjugate-13 06/19/2015    Pneumococcal Polysaccharide-23 03/05/2012   Tdap 10/14/2010   Zoster, Live 02/25/2011    TDAP status: Due, Education has been provided regarding the importance of this vaccine. Advised may receive this vaccine at local pharmacy or Health Dept. Aware to provide a copy of the vaccination record if obtained from local pharmacy or Health Dept. Verbalized acceptance and understanding.  Flu Vaccine status: Up to date  Pneumococcal vaccine status: Up to date  Covid-19 vaccine status: Completed vaccines  Qualifies for Shingles Vaccine? Yes   Zostavax completed Yes   Shingrix Completed?: No.    Education has been provided regarding the importance of this vaccine. Patient has been advised to call insurance company to determine out of pocket expense if they have not yet received this vaccine. Advised may also receive vaccine at local pharmacy or Health Dept. Verbalized acceptance and understanding.  Screening Tests Health Maintenance  Topic Date Due   Zoster Vaccines- Shingrix (1 of 2) Never done   TETANUS/TDAP  10/13/2020   COVID-19 Vaccine (5 - Booster for Pfizer series) 11/02/2020   MAMMOGRAM  04/15/2021   COLONOSCOPY (Pts 45-33yr Insurance coverage will need to be confirmed)  04/07/2022   DEXA SCAN  04/15/2022   Pneumonia Vaccine 75 Years old  Completed   INFLUENZA VACCINE  Completed   Hepatitis C Screening  Completed   HPV VACCINES  Aged Out  Health Maintenance  Health Maintenance Due  Topic Date Due   Zoster Vaccines- Shingrix (1 of 2) Never done   TETANUS/TDAP  10/13/2020   COVID-19 Vaccine (5 - Booster for Pfizer series) 11/02/2020   MAMMOGRAM  04/15/2021    Colorectal cancer screening: Type of screening: Colonoscopy. Completed 04/07/17. Repeat every 5 years  Mammogram status: Completed 04/15/20. Repeat every year. Scheduled for 04/20/21  Bone Density status: Completed 04/15/20. Results reflect: Bone density results: OSTEOPOROSIS. Repeat every 2 years.  Lung Cancer  Screening: (Low Dose CT Chest recommended if Age 28-80 years, 30 pack-year currently smoking OR have quit w/in 15years.) does not qualify.   Additional Screening:  Hepatitis C Screening: does qualify; Completed 06/28/16  Vision Screening: Recommended annual ophthalmology exams for early detection of glaucoma and other disorders of the eye. Is the patient up to date with their annual eye exam?  Yes  Who is the provider or what is the name of the office in which the patient attends annual eye exams? [facility].   Dental Screening: Recommended annual dental exams for proper oral hygiene  Community Resource Referral / Chronic Care Management: CRR required this visit?  No   CCM required this visit?  No      Plan:     I have personally reviewed and noted the following in the patient's chart:   Medical and social history Use of alcohol, tobacco or illicit drugs  Current medications and supplements including opioid prescriptions.  Functional ability and status Nutritional status Physical activity Advanced directives List of other physicians Hospitalizations, surgeries, and ER visits in previous 12 months Vitals Screenings to include cognitive, depression, and falls Referrals and appointments  In addition, I have reviewed and discussed with patient certain preventive protocols, quality metrics, and best practice recommendations. A written personalized care plan for preventive services as well as general preventive health recommendations were provided to patient.     Clemetine Marker, LPN   [id number]   Nurse Notes: none

## 2021-04-15 NOTE — Patient Instructions (Signed)
Pamela Hendrix , Thank you for taking time to come for your Medicare Wellness Visit. I appreciate your ongoing commitment to your health goals. Please review the following plan we discussed and let me know if I can assist you in the future.   Screening recommendations/referrals: Colonoscopy: done 04/07/17. Repeat 03/2022 Mammogram: done 04/15/20. Scheduled for 04/20/21 Bone Density: done 04/15/20 Recommended yearly ophthalmology/optometry visit for glaucoma screening and checkup Recommended yearly dental visit for hygiene and checkup  Vaccinations: Influenza vaccine: done 02/25/21 Pneumococcal vaccine: done 06/19/15 Tdap vaccine: due Shingles vaccine: Shingrix discussed. Please contact your pharmacy for coverage information.  Covid-19: done 07/08/19, 07/29/19, 02/10/20 & 09/07/20  Advanced directives: Please bring a copy of your health care power of attorney and living will to the office at your convenience.   Conditions/risks identified: Keep up the great work!  Next appointment: Follow up in one year for your annual wellness visit    Preventive Care 65 Years and Older, Female Preventive care refers to lifestyle choices and visits with your health care provider that can promote health and wellness. What does preventive care include? A yearly physical exam. This is also called an annual well check. Dental exams once or twice a year. Routine eye exams. Ask your health care provider how often you should have your eyes checked. Personal lifestyle choices, including: Daily care of your teeth and gums. Regular physical activity. Eating a healthy diet. Avoiding tobacco and drug use. Limiting alcohol use. Practicing safe sex. Taking low-dose aspirin every day. Taking vitamin and mineral supplements as recommended by your health care provider. What happens during an annual well check? The services and screenings done by your health care provider during your annual well check will depend on your  age, overall health, lifestyle risk factors, and family history of disease. Counseling  Your health care provider may ask you questions about your: Alcohol use. Tobacco use. Drug use. Emotional well-being. Home and relationship well-being. Sexual activity. Eating habits. History of falls. Memory and ability to understand (cognition). Work and work Statistician. Reproductive health. Screening  You may have the following tests or measurements: Height, weight, and BMI. Blood pressure. Lipid and cholesterol levels. These may be checked every 5 years, or more frequently if you are over 18 years old. Skin check. Lung cancer screening. You may have this screening every year starting at age 40 if you have a 30-pack-year history of smoking and currently smoke or have quit within the past 15 years. Fecal occult blood test (FOBT) of the stool. You may have this test every year starting at age 52. Flexible sigmoidoscopy or colonoscopy. You may have a sigmoidoscopy every 5 years or a colonoscopy every 10 years starting at age 64. Hepatitis C blood test. Hepatitis B blood test. Sexually transmitted disease (STD) testing. Diabetes screening. This is done by checking your blood sugar (glucose) after you have not eaten for a while (fasting). You may have this done every 1-3 years. Bone density scan. This is done to screen for osteoporosis. You may have this done starting at age 24. Mammogram. This may be done every 1-2 years. Talk to your health care provider about how often you should have regular mammograms. Talk with your health care provider about your test results, treatment options, and if necessary, the need for more tests. Vaccines  Your health care provider may recommend certain vaccines, such as: Influenza vaccine. This is recommended every year. Tetanus, diphtheria, and acellular pertussis (Tdap, Td) vaccine. You may need a Td booster  every 10 years. Zoster vaccine. You may need this after  age 82. Pneumococcal 13-valent conjugate (PCV13) vaccine. One dose is recommended after age 44. Pneumococcal polysaccharide (PPSV23) vaccine. One dose is recommended after age 102. Talk to your health care provider about which screenings and vaccines you need and how often you need them. This information is not intended to replace advice given to you by your health care provider. Make sure you discuss any questions you have with your health care provider. Document Released: 05/29/2015 Document Revised: 01/20/2016 Document Reviewed: 03/03/2015 Elsevier Interactive Patient Education  2017 [employer] Prevention in the Home Falls can cause injuries. They can happen to people of all ages. There are many things you can do to make your home safe and to help prevent falls. What can I do on the outside of my home? Regularly fix the edges of walkways and driveways and fix any cracks. Remove anything that might make you trip as you walk through a door, such as a raised step or threshold. Trim any bushes or trees on the path to your home. Use bright outdoor lighting. Clear any walking paths of anything that might make someone trip, such as rocks or tools. Regularly check to see if handrails are loose or broken. Make sure that both sides of any steps have handrails. Any raised decks and porches should have guardrails on the edges. Have any leaves, snow, or ice cleared regularly. Use sand or salt on walking paths during winter. Clean up any spills in your garage right away. This includes oil or grease spills. What can I do in the bathroom? Use night lights. Install grab bars by the toilet and in the tub and shower. Do not use towel bars as grab bars. Use non-skid mats or decals in the tub or shower. If you need to sit down in the shower, use a plastic, non-slip stool. Keep the floor dry. Clean up any water that spills on the floor as soon as it happens. Remove soap buildup in the tub or shower  regularly. Attach bath mats securely with double-sided non-slip rug tape. Do not have throw rugs and other things on the floor that can make you trip. What can I do in the bedroom? Use night lights. Make sure that you have a light by your bed that is easy to reach. Do not use any sheets or blankets that are too big for your bed. They should not hang down onto the floor. Have a firm chair that has side arms. You can use this for support while you get dressed. Do not have throw rugs and other things on the floor that can make you trip. What can I do in the kitchen? Clean up any spills right away. Avoid walking on wet floors. Keep items that you use a lot in easy-to-reach places. If you need to reach something above you, use a strong step stool that has a grab bar. Keep electrical cords out of the way. Do not use floor polish or wax that makes floors slippery. If you must use wax, use non-skid floor wax. Do not have throw rugs and other things on the floor that can make you trip. What can I do with my stairs? Do not leave any items on the stairs. Make sure that there are handrails on both sides of the stairs and use them. Fix handrails that are broken or loose. Make sure that handrails are as long as the stairways. Check any carpeting to  make sure that it is firmly attached to the stairs. Fix any carpet that is loose or worn. Avoid having throw rugs at the top or bottom of the stairs. If you do have throw rugs, attach them to the floor with carpet tape. Make sure that you have a light switch at the top of the stairs and the bottom of the stairs. If you do not have them, ask someone to add them for you. What else can I do to help prevent falls? Wear shoes that: Do not have high heels. Have rubber bottoms. Are comfortable and fit you well. Are closed at the toe. Do not wear sandals. If you use a stepladder: Make sure that it is fully opened. Do not climb a closed stepladder. Make sure that  both sides of the stepladder are locked into place. Ask someone to hold it for you, if possible. Clearly mark and make sure that you can see: Any grab bars or handrails. First and last steps. Where the edge of each step is. Use tools that help you move around (mobility aids) if they are needed. These include: Canes. Walkers. Scooters. Crutches. Turn on the lights when you go into a dark area. Replace any light bulbs as soon as they burn out. Set up your furniture so you have a clear path. Avoid moving your furniture around. If any of your floors are uneven, fix them. If there are any pets around you, be aware of where they are. Review your medicines with your doctor. Some medicines can make you feel dizzy. This can increase your chance of falling. Ask your doctor what other things that you can do to help prevent falls. This information is not intended to replace advice given to you by your health care provider. Make sure you discuss any questions you have with your health care provider. Document Released: 02/26/2009 Document Revised: 10/08/2015 Document Reviewed: 06/06/2014 Elsevier Interactive Patient Education  2017 [employer].

## 2021-04-20 ENCOUNTER — Ambulatory Visit
Admission: RE | Admit: 2021-04-20 | Discharge: 2021-04-20 | Disposition: A | Discharge status: Home / Self Care | Payer: Medicare HMO | Source: Ambulatory Visit | Attending: Family Medicine | Admitting: Family Medicine

## 2021-04-20 ENCOUNTER — Other Ambulatory Visit: Payer: Self-pay

## 2021-04-20 DIAGNOSIS — Z1231 Encounter for screening mammogram for malignant neoplasm of breast: Secondary | ICD-10-CM | POA: Insufficient documentation

## 2021-04-26 DIAGNOSIS — N1832 Chronic kidney disease, stage 3b: Secondary | ICD-10-CM | POA: Diagnosis not present

## 2021-04-26 DIAGNOSIS — R300 Dysuria: Secondary | ICD-10-CM | POA: Diagnosis not present

## 2021-05-25 DIAGNOSIS — M792 Neuralgia and neuritis, unspecified: Secondary | ICD-10-CM | POA: Diagnosis not present

## 2021-05-25 DIAGNOSIS — R69 Illness, unspecified: Secondary | ICD-10-CM | POA: Diagnosis not present

## 2021-05-25 DIAGNOSIS — K219 Gastro-esophageal reflux disease without esophagitis: Secondary | ICD-10-CM | POA: Diagnosis not present

## 2021-05-25 DIAGNOSIS — M810 Age-related osteoporosis without current pathological fracture: Secondary | ICD-10-CM | POA: Diagnosis not present

## 2021-05-25 DIAGNOSIS — M1990 Unspecified osteoarthritis, unspecified site: Secondary | ICD-10-CM | POA: Diagnosis not present

## 2021-05-25 DIAGNOSIS — I739 Peripheral vascular disease, unspecified: Secondary | ICD-10-CM | POA: Diagnosis not present

## 2021-05-25 DIAGNOSIS — E039 Hypothyroidism, unspecified: Secondary | ICD-10-CM | POA: Diagnosis not present

## 2021-05-25 DIAGNOSIS — Z008 Encounter for other general examination: Secondary | ICD-10-CM | POA: Diagnosis not present

## 2021-05-25 DIAGNOSIS — R32 Unspecified urinary incontinence: Secondary | ICD-10-CM | POA: Diagnosis not present

## 2021-05-25 DIAGNOSIS — G8929 Other chronic pain: Secondary | ICD-10-CM | POA: Diagnosis not present

## 2021-05-25 DIAGNOSIS — E785 Hyperlipidemia, unspecified: Secondary | ICD-10-CM | POA: Diagnosis not present

## 2021-05-25 DIAGNOSIS — I10 Essential (primary) hypertension: Secondary | ICD-10-CM | POA: Diagnosis not present

## 2021-05-25 DIAGNOSIS — J45909 Unspecified asthma, uncomplicated: Secondary | ICD-10-CM | POA: Diagnosis not present

## 2021-07-05 DIAGNOSIS — R300 Dysuria: Secondary | ICD-10-CM | POA: Diagnosis not present

## 2021-07-08 ENCOUNTER — Other Ambulatory Visit: Payer: Self-pay | Admitting: Family Medicine

## 2021-07-12 DIAGNOSIS — I10 Essential (primary) hypertension: Secondary | ICD-10-CM | POA: Diagnosis not present

## 2021-07-12 DIAGNOSIS — N1831 Chronic kidney disease, stage 3a: Secondary | ICD-10-CM | POA: Diagnosis not present

## 2021-07-12 DIAGNOSIS — N1832 Chronic kidney disease, stage 3b: Secondary | ICD-10-CM | POA: Diagnosis not present

## 2021-07-12 DIAGNOSIS — N2581 Secondary hyperparathyroidism of renal origin: Secondary | ICD-10-CM | POA: Diagnosis not present

## 2021-07-12 DIAGNOSIS — D631 Anemia in chronic kidney disease: Secondary | ICD-10-CM | POA: Diagnosis not present

## 2021-08-20 ENCOUNTER — Other Ambulatory Visit: Payer: Self-pay

## 2021-08-20 DIAGNOSIS — E039 Hypothyroidism, unspecified: Secondary | ICD-10-CM

## 2021-08-20 DIAGNOSIS — J309 Allergic rhinitis, unspecified: Secondary | ICD-10-CM

## 2021-08-20 DIAGNOSIS — E782 Mixed hyperlipidemia: Secondary | ICD-10-CM

## 2021-08-20 MED ORDER — LEVOTHYROXINE SODIUM 88 MCG PO TABS: 88.0000 ug | ORAL_TABLET | Freq: Every day | ORAL | 3 refills | Status: DC

## 2021-08-20 MED ORDER — LOVASTATIN 20 MG PO TABS: 20.0000 mg | ORAL_TABLET | Freq: Every day | ORAL | 3 refills | Status: DC

## 2021-08-20 MED ORDER — MONTELUKAST SODIUM 10 MG PO TABS: 10.0000 mg | ORAL_TABLET | Freq: Every day | ORAL | 3 refills | Status: DC

## 2021-08-20 NOTE — Telephone Encounter (Signed)
Pt requesting [facility]. Please advice it is not letting me sign ?

## 2021-09-09 ENCOUNTER — Encounter: Payer: Self-pay | Admitting: Physician Assistant

## 2021-09-09 ENCOUNTER — Ambulatory Visit (INDEPENDENT_AMBULATORY_CARE_PROVIDER_SITE_OTHER): Payer: Medicare HMO | Admitting: Physician Assistant

## 2021-09-09 VITALS — BP 110/76 | HR 74 | Resp 16 | Ht 63.0 in | Wt 155.0 lb

## 2021-09-09 DIAGNOSIS — I10 Essential (primary) hypertension: Secondary | ICD-10-CM | POA: Diagnosis not present

## 2021-09-09 DIAGNOSIS — M810 Age-related osteoporosis without current pathological fracture: Secondary | ICD-10-CM

## 2021-09-09 DIAGNOSIS — E039 Hypothyroidism, unspecified: Secondary | ICD-10-CM | POA: Diagnosis not present

## 2021-09-09 DIAGNOSIS — N1831 Chronic kidney disease, stage 3a: Secondary | ICD-10-CM | POA: Diagnosis not present

## 2021-09-09 DIAGNOSIS — E782 Mixed hyperlipidemia: Secondary | ICD-10-CM | POA: Diagnosis not present

## 2021-09-09 HISTORY — DX: Age-related osteoporosis without current pathological fracture: M81.0

## 2021-09-09 NOTE — Assessment & Plan Note (Signed)
Chronic, historic condition  ?Currently well controlled with Losartan 100 mg PO QD  ?Appears well tolerated ?Continue current medication regimen ?Encouraged diet and exercise as supplemental management  ?Follow up in 6 months.  ?

## 2021-09-09 NOTE — Assessment & Plan Note (Addendum)
Chronic, ongoing, historic condition ?Currently managed with Fosamax 70 mg PO every 7 days ? She is taking Vitamin D supplementation - she states she has never been instructed to start a calcium supplement in the past ?Will check CMP and calcium levels to assist with supplementation directions ?Reports some difficulty following Fosamax administration instructions due to her preferred dietary schedule and overall eating habits ?Discussed that this is important for prevention and she should continue exercising but if she is stable, without fracture for 5 years she may be able to dc per Uptodate guidelines ?Follow up in 6 months  ?

## 2021-09-09 NOTE — Assessment & Plan Note (Signed)
Chronic, historic condition ?Last GFR was 55 per Nephrology records in Feb 2023  ?Recommend she avoid Nephrotoxic agents and use renal dosing for medications ?Recommend she stay well hydrated ?Follow up with nephrology for further management ? ?

## 2021-09-09 NOTE — Assessment & Plan Note (Signed)
Chronic, historic condition  ?Currently taking Lovastatin 20 mg PO QHS, appears to be tolerating well ?Recheck lipid panel ?Results to dictate further management interventions ?Recommend she continue exercise and diet modifications to assist with management ?Follow up in 6 months for monitoring  ?

## 2021-09-09 NOTE — Progress Notes (Signed)
? ?Established Patient Office Visit ? ?Subjective   ?Patient ID: Pamela Hendrix, female    DOB: [birth year]  Age: 76 y.o. MRN: [id number] ? ?Today's Provider: Talitha Givens, MHS, PA-C ?Introduced myself to the patient as a Journalist, newspaper and provided education on APPs in clinical practice.  ? ? ?Chief Complaint  ?Patient presents with  ? Follow-up  ? ? ?HPI ? ? ?HTN:  ?Currently taking Losartan and reports excellent compliance, denies side effects or concerns at this time ? ?HLD: Taking Lovastatin 20 mg at bedtime. Appears to be tolerating well. Is trying to increase exercise  ? ?CKD: Last GFR was 55 per nephrology records at the end of Feb. She is avoiding NSAIDs, staying well hydrated,  ? ?Osteoporosis: reports she is having some trouble with Fosamax due to administration recommendations ?States she usually eats more small, frequent meals rather than large meals. She has never had a fracture that she is aware of. She has been taking Fosamax for about one year.  ?Discussed benefits of exercise and weight training to assist with bone health and overall fitness ? ?Hypothyroidism: currently managed with Levothyroxine 88 mcg and appears to be tolerating well. Reports she had a thyroid nodule - aspiration performed for evaluation and she was not informed of any abnormal results  ? ? ? ?Outpatient Encounter Medications as of 09/09/2021  ?Medication Sig  ? alendronate (FOSAMAX) 70 MG tablet TAKE 1 TABLET BY MOUTH EVERY 7 DAYS TAKE WITH A FULL GLASS OF WATER ON AN EMPTY STOMACH  ? aspirin EC 81 MG tablet Take 81 mg by mouth daily.  ? cetirizine (ZYRTEC) 10 MG tablet Take 10 mg by mouth daily. otc  ? Cholecalciferol (VITAMIN D-3) 1000 units CAPS Take 1 capsule by mouth daily.  ? dicyclomine (BENTYL) 10 MG capsule TAKE 1 CAPSULE BY MOUTH THREE TIMES DAILY BEFORE MEAL(S)  ? fluticasone (FLONASE) 50 MCG/ACT nasal spray Place 2 sprays into both nostrils daily. PRN  ? levothyroxine (EUTHYROX) 88 MCG tablet Take 1 tablet (88 mcg total)  by mouth daily. First thing in the morning on empty stomach  ? losartan (COZAAR) 100 MG tablet Take 100 mg by mouth daily.  ? lovastatin (MEVACOR) 20 MG tablet Take 1 tablet (20 mg total) by mouth at bedtime.  ? montelukast (SINGULAIR) 10 MG tablet Take 1 tablet (10 mg total) by mouth daily.  ? [DISCONTINUED] clobetasol ointment (TEMOVATE) 0.05 % Apply topically 2 (two) times daily as needed.  ? [DISCONTINUED] Inulin (FIBER CHOICE PO) Take by mouth.  ? [DISCONTINUED] methocarbamol (ROBAXIN) 500 MG tablet Take 1 tablet (500 mg total) by mouth 2 (two) times daily as needed for muscle spasms. (Patient not taking: Reported on 04/15/2021)  ? ?No facility-administered encounter medications on file as of 09/09/2021.  ?  ?Patient Active Problem List  ? Diagnosis Date Noted  ? Age-related osteoporosis without current pathological fracture 09/09/2021  ? Mild anemia 09/03/2019  ? Vitamin D deficiency 09/03/2019  ? Migraine aura without headache 01/01/2019  ? Decreased GFR 01/01/2019  ? CKD (chronic kidney disease) stage 3, GFR 30-59 ml/min (HCC) 12/04/2018  ? PFO (patent foramen ovale) 07/31/2018  ? Abnormal ECG 07/13/2018  ? Bradycardia 07/13/2018  ? Primary osteoarthritis of left knee 11/20/2017  ? History of GI diverticular bleed 05/18/2017  ? Vitamin B12 deficiency anemia 05/18/2017  ? Chronic allergic rhinitis 06/28/2016  ? Mixed hyperlipidemia 06/28/2016  ? Hiatal hernia 08/28/2014  ? Hypothyroid 08/28/2014  ? Headache 10/25/2013  ? Hypertension 10/25/2013  ? Numbness  and tingling 10/25/2013  ? Obesity 10/25/2013  ? Visual disturbance 10/25/2013  ? Thyroid nodule 09/19/2013  ? Onychomycosis due to dermatophyte 08/21/2013  ? ? ? ? ?Review of Systems  ?Constitutional:  Negative for chills, fever, malaise/fatigue and weight loss.  ?HENT:  Negative for congestion, sinus pain and sore throat.   ?Eyes:  Negative for blurred vision and double vision.  ?Respiratory:  Negative for cough, shortness of breath and wheezing.    ?Cardiovascular:  Positive for leg swelling (ankles swelling). Negative for chest pain and palpitations.  ?Musculoskeletal:  Positive for joint pain (left knee pain). Negative for falls and myalgias.  ?Skin:  Negative for itching and rash.  ?Neurological:  Negative for dizziness, tremors and headaches.  ?Psychiatric/Behavioral:  Negative for depression. The patient is not nervous/anxious and does not have insomnia.   ? ?  ?Objective:  ?  ? ?BP 110/76   Pulse 74   Resp 16   Ht '5\' 3"'$  (1.6 m)   Wt 155 lb (70.3 kg)   SpO2 99%   BMI 27.46 kg/m?  ? ? ?Physical Exam ?Vitals reviewed.  ?Constitutional:   ?   Appearance: Normal appearance. She is normal weight.  ?HENT:  ?   Head: Normocephalic and atraumatic.  ?Cardiovascular:  ?   Rate and Rhythm: Normal rate and regular rhythm.  ?   Pulses: Normal pulses.     ?     Radial pulses are 2+ on the right side and 2+ on the left side.  ?   Heart sounds: Normal heart sounds.  ?Pulmonary:  ?   Effort: Pulmonary effort is normal.  ?   Breath sounds: Normal air entry. No decreased breath sounds, wheezing, rhonchi or rales.  ?Musculoskeletal:  ?   Cervical back: Normal range of motion and neck supple.  ?   Right lower leg: 1+ Edema present.  ?   Left lower leg: 1+ Edema present.  ?Lymphadenopathy:  ?   Head:  ?   Right side of head: No submental or submandibular adenopathy.  ?   Left side of head: No submental or submandibular adenopathy.  ?   Upper Body:  ?   Right upper body: No supraclavicular adenopathy.  ?   Left upper body: No supraclavicular adenopathy.  ?Skin: ?   General: Skin is warm.  ?   Capillary Refill: Capillary refill takes less than 2 seconds.  ?Neurological:  ?   Mental Status: She is alert.  ?Psychiatric:     ?   Attention and Perception: Attention and perception normal.     ?   Mood and Affect: Mood and affect normal.     ?   Speech: Speech normal.     ?   Behavior: Behavior normal. Behavior is cooperative.  ? ? ? ?No results found for any visits on  09/09/21. ? ?Last CBC ?Lab Results  ?Component Value Date  ? WBC 7.3 08/20/2020  ? HGB 10.3 (L) 08/20/2020  ? HCT 31.6 (L) 08/20/2020  ? MCV 97.8 08/20/2020  ? MCH 31.9 08/20/2020  ? RDW 12.8 08/20/2020  ? PLT 153 08/20/2020  ? ?Last metabolic panel ?Lab Results  ?Component Value Date  ? GLUCOSE 90 03/10/2021  ? NA 139 03/10/2021  ? K 4.4 03/10/2021  ? CL 105 03/10/2021  ? CO2 27 03/10/2021  ? BUN 19 03/10/2021  ? CREATININE 1.25 (H) 03/10/2021  ? GFRNONAA 43 (L) 08/20/2020  ? CALCIUM 8.9 03/10/2021  ? PHOS 3.7 09/07/2016  ?  PROT 6.9 08/20/2020  ? ALBUMIN 3.5 04/06/2017  ? BILITOT 0.6 08/20/2020  ? ALKPHOS 58 04/06/2017  ? AST 14 08/20/2020  ? ALT 6 08/20/2020  ? ANIONGAP 5 04/07/2017  ? ?Last lipids ?Lab Results  ?Component Value Date  ? CHOL 137 03/10/2021  ? HDL 57 03/10/2021  ? [place] 62 03/10/2021  ? TRIG 101 03/10/2021  ? CHOLHDL 2.4 03/10/2021  ? ?Last hemoglobin A1c ?No results found for: HGBA1C ?Last thyroid functions ?Lab Results  ?Component Value Date  ? TSH 1.05 03/10/2021  ? ?Last vitamin D ?Lab Results  ?Component Value Date  ? VD25OH 33 05/06/2019  ? ?Last vitamin B12 and Folate ?Lab Results  ?Component Value Date  ? VITAMINB12 1,007 (H) 05/18/2017  ? FOLATE 14.9 04/07/2017  ? ?  ? ?The 10-year ASCVD risk score (Arnett DK, et al., 2019) is: 15.5% ? ?  ?Assessment & Plan:  ? ?Problem List Items Addressed This Visit   ? ?  ? Cardiovascular and Mediastinum  ? Hypertension - Primary  ?  Chronic, historic condition  ?Currently well controlled with Losartan 100 mg PO QD  ?Appears well tolerated ?Continue current medication regimen ?Encouraged diet and exercise as supplemental management  ?Follow up in 6 months.  ? ?  ?  ? Relevant Orders  ? COMPLETE METABOLIC PANEL WITH GFR  ? CBC w/Diff/Platelet  ?  ? Endocrine  ? Hypothyroid  ?  Chronic, historic condition ?Most recent TSH was 1.05 in Oct 2022  ?She is taking Levothyroxine 88 mcg PO QD and appears to be tolerating well ?Recommend recheck TSH and adjust  as needed ?For now continue current regimen ?Follow up in 6 months for monitoring ? ? ?  ?  ? Relevant Orders  ? TSH  ?  ? Musculoskeletal and Integument  ? Age-related osteoporosis without current pathological fracture  ?

## 2021-09-09 NOTE — Assessment & Plan Note (Signed)
Chronic, historic condition ?Most recent TSH was 1.05 in Oct 2022  ?She is taking Levothyroxine 88 mcg PO QD and appears to be tolerating well ?Recommend recheck TSH and adjust as needed ?For now continue current regimen ?Follow up in 6 months for monitoring ? ?

## 2021-09-13 DIAGNOSIS — N1831 Chronic kidney disease, stage 3a: Secondary | ICD-10-CM | POA: Diagnosis not present

## 2021-09-13 DIAGNOSIS — E782 Mixed hyperlipidemia: Secondary | ICD-10-CM | POA: Diagnosis not present

## 2021-09-13 DIAGNOSIS — I10 Essential (primary) hypertension: Secondary | ICD-10-CM | POA: Diagnosis not present

## 2021-09-13 DIAGNOSIS — E039 Hypothyroidism, unspecified: Secondary | ICD-10-CM | POA: Diagnosis not present

## 2021-09-14 LAB — COMPLETE METABOLIC PANEL WITH GFR
AG Ratio: 1.3 (calc) (ref 1.0–2.5)
ALT: 8 U/L (ref 6–29)
AST: 14 U/L (ref 10–35)
Albumin: 3.9 g/dL (ref 3.6–5.1)
Alkaline phosphatase (APISO): 46 U/L (ref 37–153)
BUN/Creatinine Ratio: 11 (calc) (ref 6–22)
BUN: 14 mg/dL (ref 7–25)
CO2: 26 mmol/L (ref 20–32)
Calcium: 8.9 mg/dL (ref 8.6–10.4)
Chloride: 108 mmol/L (ref 98–110)
Creat: 1.24 mg/dL — ABNORMAL HIGH (ref 0.60–1.00)
Globulin: 3.1 g/dL (calc) (ref 1.9–3.7)
Glucose, Bld: 100 mg/dL — ABNORMAL HIGH (ref 65–99)
Potassium: 3.9 mmol/L (ref 3.5–5.3)
Sodium: 141 mmol/L (ref 135–146)
Total Bilirubin: 0.9 mg/dL (ref 0.2–1.2)
Total Protein: 7 g/dL (ref 6.1–8.1)
eGFR: 45 mL/min/{1.73_m2} — ABNORMAL LOW (ref >=60)

## 2021-09-14 LAB — LIPID PANEL
Cholesterol: 128 mg/dL (ref <200)
HDL: 55 mg/dL (ref >=50)
LDL Cholesterol (Calc): 55 mg/dL (calc)
Non-HDL Cholesterol (Calc): 73 mg/dL (calc) (ref <130)
Total CHOL/HDL Ratio: 2.3 (calc) (ref <5.0)
Triglycerides: 94 mg/dL (ref <150)

## 2021-09-14 LAB — CBC WITH DIFFERENTIAL/PLATELET
Absolute Monocytes: 611 cells/uL (ref 200–950)
Basophils Absolute: 43 cells/uL (ref 0–200)
Basophils Relative: 0.5 %
Eosinophils Absolute: 69 cells/uL (ref 15–500)
Eosinophils Relative: 0.8 %
HCT: 31.6 % — ABNORMAL LOW (ref 35.0–45.0)
Hemoglobin: 10.5 g/dL — ABNORMAL LOW (ref 11.7–15.5)
Lymphs Abs: 1772 cells/uL (ref 850–3900)
MCH: 32.4 pg (ref 27.0–33.0)
MCHC: 33.2 g/dL (ref 32.0–36.0)
MCV: 97.5 fL (ref 80.0–100.0)
MPV: 12.6 fL — ABNORMAL HIGH (ref 7.5–12.5)
Monocytes Relative: 7.1 %
Neutro Abs: 6106 cells/uL (ref 1500–7800)
Neutrophils Relative %: 71 %
Platelets: 133 10*3/uL — ABNORMAL LOW (ref 140–400)
RBC: 3.24 10*6/uL — ABNORMAL LOW (ref 3.80–5.10)
RDW: 12.9 % (ref 11.0–15.0)
Total Lymphocyte: 20.6 %
WBC: 8.6 10*3/uL (ref 3.8–10.8)

## 2021-09-14 LAB — TSH: TSH: 0.94 mIU/L (ref 0.40–4.50)

## 2021-09-15 ENCOUNTER — Encounter: Payer: Self-pay | Admitting: Nurse Practitioner

## 2021-09-15 ENCOUNTER — Ambulatory Visit (INDEPENDENT_AMBULATORY_CARE_PROVIDER_SITE_OTHER): Payer: Medicare HMO | Admitting: Nurse Practitioner

## 2021-09-15 ENCOUNTER — Other Ambulatory Visit: Payer: Self-pay

## 2021-09-15 VITALS — BP 124/76 | HR 69 | Temp 98.3°F | Resp 14 | Ht 63.0 in | Wt 151.2 lb

## 2021-09-15 DIAGNOSIS — I10 Essential (primary) hypertension: Secondary | ICD-10-CM

## 2021-09-15 DIAGNOSIS — E039 Hypothyroidism, unspecified: Secondary | ICD-10-CM

## 2021-09-15 DIAGNOSIS — Z0000 Encounter for general adult medical examination without abnormal findings: Secondary | ICD-10-CM

## 2021-09-15 DIAGNOSIS — E782 Mixed hyperlipidemia: Secondary | ICD-10-CM

## 2021-09-15 DIAGNOSIS — N1831 Chronic kidney disease, stage 3a: Secondary | ICD-10-CM | POA: Diagnosis not present

## 2021-09-15 NOTE — Progress Notes (Signed)
Name: Pamela Hendrix   MRN: [id number]    DOB: [birth year]   Date:09/15/2021 ? ?     Progress Note ? ?Subjective ? ?Chief Complaint ? ?Chief Complaint  ?Patient presents with  ? Annual Exam  ? ? ?HPI ? ?Patient presents for annual CPE. ? ?Diet: she says they eat a lot of fried foods, she does get some dairy in her diet ?Exercise: yoga, she has joined a church group once a week to do yoga, discussed recommendations of 150 minutes a week of physical activity. ?Sleep: about 8 hours of sleep ? ?[place] Office Visit from 09/15/2021 in [facility]  ?AUDIT-C Score 0  ? ?  ? ?Depression: Phq 9 is  positive, mild  ? ?  09/15/2021  ?  8:18 AM 09/09/2021  ?  1:05 PM 04/15/2021  ? 10:13 AM 03/25/2021  ? 10:58 AM 03/10/2021  ? 10:55 AM  ?Depression screen PHQ 2/9  ?Decreased Interest 1 1 1 1  0  ?Down, Depressed, Hopeless 1 1 1 1  0  ?PHQ - 2 Score 2 2 2 2  0  ?Altered sleeping 0 0 0 0 0  ?Tired, decreased energy 1 3 1  0 0  ?Change in appetite 1 0 0 0 0  ?Feeling bad or failure about yourself  1 1 0 0 0  ?Trouble concentrating 1 0 1 0 0  ?Moving slowly or fidgety/restless 0 0 0 0 0  ?Suicidal thoughts 0 0 0 0 0  ?PHQ-9 Score 6 6 4 2  0  ?Difficult doing work/chores Not difficult at all  Not difficult at all Not difficult at all Not difficult at all  ? ?Hypertension: ?BP Readings from Last 3 Encounters:  ?09/15/21 124/76  ?09/09/21 110/76  ?03/25/21 120/82  ? ?Obesity: ?Wt Readings from Last 3 Encounters:  ?09/15/21 151 lb 3.2 oz (68.6 kg)  ?09/09/21 155 lb (70.3 kg)  ?03/25/21 160 lb 1.6 oz (72.6 kg)  ? ?BMI Readings from Last 3 Encounters:  ?09/15/21 26.78 kg/m?  ?09/09/21 27.46 kg/m?  ?03/25/21 29.28 kg/m?  ?  ? ?Vaccines:  ?HPV: up to at age 71 , ask insurance if age between 77-45  ?Shingrix: 8-64 yo and ask insurance if covered when patient above 40 yo ?Pneumonia:  educated and discussed with patient. ?Flu:  educated and discussed with patient. ? ?Hep C Screening: 06/28/2016 ?STD testing and prevention  (HIV/chl/gon/syphilis): declined ?Intimate partner violence:no violence ?Sexual History :not sexually active ?Menstrual History/LMP/Abnormal Bleeding: postmenopausal ?Incontinence Symptoms: stress incontinence ? ?Breast cancer:  ?- Last Mammogram: 04/20/2021 ?- BRCA gene screening: none ? ?Osteoporosis: Discussed high calcium and vitamin D supplementation, weight bearing exercises ? ?Cervical cancer screening: aged out ? ?Skin cancer: Discussed monitoring for atypical lesions  ?Colorectal cancer: 04/07/2017  ?Lung cancer:   Low Dose CT Chest recommended if Age 30-80 years, 20 pack-year currently smoking OR have quit w/in 15years. Patient does not qualify.   ?ECG: 12/05/2013 ? ?Advanced Care Planning: A voluntary discussion about advance care planning including the explanation and discussion of advance directives.  Discussed health care proxy and Living will, and the patient was able to identify a health care proxy as husband.  Patient does have a living will at present time. If patient does have living will, I have requested they bring this to the clinic to be scanned in to their chart.She brought a copy and is in the file.  ? ?Lipids: ?Lab Results  ?Component Value Date  ? CHOL 128 09/13/2021  ? CHOL  137 03/10/2021  ? CHOL 154 02/20/2020  ? ?Lab Results  ?Component Value Date  ? HDL 55 09/13/2021  ? HDL 57 03/10/2021  ? HDL 61 02/20/2020  ? ?Lab Results  ?Component Value Date  ? [place] 55 09/13/2021  ? [place] 62 03/10/2021  ? LDLCALC 72 02/20/2020  ? ?Lab Results  ?Component Value Date  ? TRIG 94 09/13/2021  ? TRIG 101 03/10/2021  ? TRIG 128 02/20/2020  ? ?Lab Results  ?Component Value Date  ? CHOLHDL 2.3 09/13/2021  ? CHOLHDL 2.4 03/10/2021  ? CHOLHDL 2.5 02/20/2020  ? ?No results found for: LDLDIRECT ? ?Glucose: ?Glucose  ?Date Value Ref Range Status  ?12/05/2013 102 (H) 65 - 99 mg/dL Final  ?06/03/2011 112 (H) 65 - 99 mg/dL Final  ? ?Glucose, Bld  ?Date Value Ref Range Status  ?09/13/2021 100 (H) 65 - 99 mg/dL  Final  ?  Comment:  ?  . ?           Fasting reference interval ?. ?For someone without known diabetes, a glucose value ?between 100 and 125 mg/dL is consistent with ?prediabetes and should be confirmed with a ?follow-up test. ?. ?  ?03/10/2021 90 65 - 99 mg/dL Final  ?  Comment:  ?  . ?           Fasting reference interval ?. ?  ?08/20/2020 99 65 - 99 mg/dL Final  ?  Comment:  ?  . ?           Fasting reference interval ?. ?  ? ?Glucose-Capillary  ?Date Value Ref Range Status  ?04/07/2017 101 (H) 65 - 99 mg/dL Final  ? ? ?Patient Active Problem List  ? Diagnosis Date Noted  ? Age-related osteoporosis without current pathological fracture 09/09/2021  ? Mild anemia 09/03/2019  ? Vitamin D deficiency 09/03/2019  ? Migraine aura without headache 01/01/2019  ? Decreased GFR 01/01/2019  ? CKD (chronic kidney disease) stage 3, GFR 30-59 ml/min (HCC) 12/04/2018  ? PFO (patent foramen ovale) 07/31/2018  ? Abnormal ECG 07/13/2018  ? Bradycardia 07/13/2018  ? Primary osteoarthritis of left knee 11/20/2017  ? History of GI diverticular bleed 05/18/2017  ? Vitamin B12 deficiency anemia 05/18/2017  ? Chronic allergic rhinitis 06/28/2016  ? Mixed hyperlipidemia 06/28/2016  ? Hiatal hernia 08/28/2014  ? Hypothyroid 08/28/2014  ? Headache 10/25/2013  ? Hypertension 10/25/2013  ? Numbness and tingling 10/25/2013  ? Obesity 10/25/2013  ? Visual disturbance 10/25/2013  ? Thyroid nodule 09/19/2013  ? Onychomycosis due to dermatophyte 08/21/2013  ? ? ?Past Surgical History:  ?Procedure Laterality Date  ? CHOLECYSTECTOMY    ? COLONOSCOPY  06/02/2014  ? cleared for 10 yrs- Dr Rayann Heman  ? COLONOSCOPY N/A 04/07/2017  ? Procedure: COLONOSCOPY;  Surgeon: Lin Landsman, MD;  Location: [facility] ENDOSCOPY;  Service: Gastroenterology;  Laterality: N/A;  ? ESOPHAGEAL DILATION    ? ESOPHAGOGASTRODUODENOSCOPY N/A 04/07/2017  ? Procedure: ESOPHAGOGASTRODUODENOSCOPY (EGD);  Surgeon: Lin Landsman, MD;  Location: [facility] ENDOSCOPY;  Service:  Gastroenterology;  Laterality: N/A;  ? HERNIA REPAIR  08/28/2014  ? estimate  ? TONSILLECTOMY    ? TUBAL LIGATION  1975  ? estimate  ? UPPER GI ENDOSCOPY  06/02/2014  ? ? ?Family History  ?Problem Relation Age of Onset  ? Kidney disease Mother   ? Cancer Mother   ?     brain  ? Arthritis Mother   ? Obesity Mother   ? Varicose Veins Mother   ? Heart  disease Father   ? Heart attack Father   ? Hearing loss Father   ? Hypertension Father   ? Lupus Sister   ? Diabetes Sister   ? Thyroid disease Sister   ? Rheum arthritis Sister   ? Arthritis Sister   ? Obesity Sister   ? Diabetes Sister   ? Arthritis Sister   ? Cancer Sister   ? Hearing loss Sister   ? Intellectual disability Sister   ? Learning disabilities Sister   ? Multiple myeloma Sister   ? Thyroid disease Sister   ? Rheum arthritis Sister   ? Arthritis Sister   ? Cancer Sister   ? Obesity Sister   ? Breast cancer Maternal Aunt   ? Miscarriages / [place] Daughter   ? ? ?Social History  ? ?Socioeconomic History  ? Marital status: Married  ?  Spouse name: Doren Custard  ? Number of children: 1  ? Years of education: some college  ? Highest education level: 12th grade  ?Occupational History  ? Occupation: Retired  ?Tobacco Use  ? Smoking status: Never  ? Smokeless tobacco: Never  ? Tobacco comments:  ?  Never, but father and husband smoked  ?Vaping Use  ? Vaping Use: Never used  ?Substance and Sexual Activity  ? Alcohol use: Not Currently  ?  Alcohol/week: 0.0 standard drinks  ?  Comment: on special occasions like New Years Eve  ? Drug use: Never  ? Sexual activity: Not Currently  ?  Birth control/protection: Post-menopausal, Surgical  ?  Comment: tubes tied in my 30's, now post-menopausal  ?Other Topics Concern  ? Not on file  ?Social History Narrative  ? Not on file  ? ?Social Determinants of Health  ? ?Financial Resource Strain: Low Risk   ? Difficulty of Paying Living Expenses: Not hard at all  ?Food Insecurity: No Food Insecurity  ? Worried About Sales executive in the Last Year: Never true  ? Ran Out of Food in the Last Year: Never true  ?Transportation Needs: No Transportation Needs  ? Lack of Transportation (Medical): No  ? Lack of Transportation (Non-Medical): No

## 2021-09-22 ENCOUNTER — Encounter: Payer: Medicare HMO | Admitting: Nurse Practitioner

## 2021-09-22 ENCOUNTER — Encounter: Payer: Self-pay | Admitting: Gastroenterology

## 2021-09-22 ENCOUNTER — Ambulatory Visit (INDEPENDENT_AMBULATORY_CARE_PROVIDER_SITE_OTHER): Payer: Medicare HMO | Admitting: Gastroenterology

## 2021-09-22 VITALS — BP 132/67 | HR 63 | Temp 97.9°F | Ht 63.0 in | Wt 156.0 lb

## 2021-09-22 DIAGNOSIS — K625 Hemorrhage of anus and rectum: Secondary | ICD-10-CM

## 2021-09-22 DIAGNOSIS — R1319 Other dysphagia: Secondary | ICD-10-CM

## 2021-09-22 NOTE — Progress Notes (Signed)
? ? ?Primary Care Physician: Delsa Grana, PA-C ? ?Primary Gastroenterologist:  Dr. Lucilla Lame ? ?Chief Complaint  ?Patient presents with  ? Dysphagia  ? Rectal Bleeding  ? ? ?HPI: Pamela Hendrix is a 76 y.o. female here at the same in the past alternating diarrhea and constipation while taking MiraLAX.  The patient had stopped the MiraLAX and reported the diarrhea had also stopped at that time.  The patient was also taking dicyclomine.  The patient also had a upper endoscopy and colonoscopy by Dr. Marius Ditch back in 2018 and at that time was found to have some blood in the colon consistent with diverticular bleed with blood seen at the splenic flexure.  The patient also was found to have a toupee wrap of the stomach and the GJ with dilation done at that time for dysphagia.  The patient now is being seen because of rectal bleeding and dysphagia. ? ?Patient reports that her dysphagia was at the same time she was having rectal bleeding and lasted approximately about 5 days and then had improved.  The rectal bleeding was so severe when I that she states that she had bleeding through her undergarments and through the sheets down to the mattress pad and into the mattress.  She also reports that she has 1 external hemorrhoid.  The patient does report that the dysphagia has improved but she still has food feeling like is not going down approximately once a week. ? ?Past Medical History:  ?Diagnosis Date  ? Age-related osteoporosis without current pathological fracture 09/09/2021  ? Allergy   ? Anemia 2000  ? estimate  ? Blood transfusion without reported diagnosis 04/07/2017  ? estimate  ? Bursitis   ? CKD (chronic kidney disease) stage 3, GFR 30-59 ml/min (HCC)   ? Diverticulosis   ? GERD (gastroesophageal reflux disease)   ? Hiatal hernia   ? Hypertension   ? Migraine with aura   ? states she doesn't have a headache, just the aura  ? Mixed hyperlipidemia   ? Osteoarthritis of knee   ? Thyroid disease   ? ? ?Current  Outpatient Medications  ?Medication Sig Dispense Refill  ? alendronate (FOSAMAX) 70 MG tablet TAKE 1 TABLET BY MOUTH EVERY 7 DAYS TAKE WITH A FULL GLASS OF WATER ON AN EMPTY STOMACH 12 tablet 0  ? aspirin EC 81 MG tablet Take 81 mg by mouth every other day.    ? cetirizine (ZYRTEC) 10 MG tablet Take 10 mg by mouth daily. otc    ? Cholecalciferol (VITAMIN D-3) 1000 units CAPS Take 1 capsule by mouth daily.    ? dicyclomine (BENTYL) 10 MG capsule TAKE 1 CAPSULE BY MOUTH THREE TIMES DAILY BEFORE MEAL(S) 90 capsule 3  ? fluticasone (FLONASE) 50 MCG/ACT nasal spray Place 2 sprays into both nostrils daily. PRN 16 g 2  ? levothyroxine (EUTHYROX) 88 MCG tablet Take 1 tablet (88 mcg total) by mouth daily. First thing in the morning on empty stomach 90 tablet 3  ? losartan (COZAAR) 100 MG tablet Take 100 mg by mouth daily.    ? lovastatin (MEVACOR) 20 MG tablet Take 1 tablet (20 mg total) by mouth at bedtime. 90 tablet 3  ? montelukast (SINGULAIR) 10 MG tablet Take 1 tablet (10 mg total) by mouth daily. 90 tablet 3  ? ?No current facility-administered medications for this visit.  ? ? ?Allergies as of 09/22/2021 - Review Complete 09/22/2021  ?Allergen Reaction Noted  ? Lisinopril  05/01/2019  ? Codeine  Nausea Only and Nausea And Vomiting 12/24/2013  ? ? ?ROS: ? ?General: Negative for anorexia, weight loss, fever, chills, fatigue, weakness. ?ENT: Negative for hoarseness, difficulty swallowing , nasal congestion. ?CV: Negative for chest pain, angina, palpitations, dyspnea on exertion, peripheral edema.  ?Respiratory: Negative for dyspnea at rest, dyspnea on exertion, cough, sputum, wheezing.  ?GI: See history of present illness. ?GU:  Negative for dysuria, hematuria, urinary incontinence, urinary frequency, nocturnal urination.  ?Endo: Negative for unusual weight change.  ?  ?Physical Examination: ? ? BP 132/67   Pulse 63   Temp 97.9 ?F (36.6 ?C) (Oral)   Ht '5\' 3"'$  (1.6 m)   Wt 156 lb (70.8 kg)   BMI 27.63 kg/m?  ? ?General:  Well-nourished, well-developed in no acute distress.  ?Eyes: No icterus. Conjunctivae pink. ?Neuro: Alert and oriented x 3.  Grossly intact. ?Skin: Warm and dry, no jaundice.   ?Psych: Alert and cooperative, normal mood and affect. ? ?Labs:  ?  ?Imaging Studies: ?No results found. ? ?Assessment and Plan:  ? ?Pamela Hendrix is a 76 y.o. y/o female who comes in with rectal bleeding and a history of hemorrhoids.  The patient had a normal colonoscopy by Dr. Marius Ditch in the past.  The patient will be set up for repeat EGD for her dysphagia and history of esophageal stricture.  The patient will also be set up for hemorrhoidal banding due to her bothersome hemorrhoids.  The patient has been explained the plan agrees with it. ? ? ? ? ?Lucilla Lame, MD. Mar[facility] Regal ? ? ? Note: This dictation was prepared with Dragon dictation along with smaller phrase technology. Any transcriptional errors that result from this process are unintentional.  ?

## 2021-09-22 NOTE — Addendum Note (Signed)
Addended by: Lurlean Nanny on: 09/22/2021 10:40 AM ? ? Modules accepted: Orders ? ?

## 2021-09-22 NOTE — H&P (View-Only) (Signed)
Primary Care Physician: Delsa Grana, PA-C  Primary Gastroenterologist:  Dr. Lucilla Lame  Chief Complaint  Patient presents with   Dysphagia   Rectal Bleeding    HPI: Pamela Hendrix is a 76 y.o. female here at the same in the past alternating diarrhea and constipation while taking MiraLAX.  The patient had stopped the MiraLAX and reported the diarrhea had also stopped at that time.  The patient was also taking dicyclomine.  The patient also had a upper endoscopy and colonoscopy by Dr. Marius Ditch back in 2018 and at that time was found to have some blood in the colon consistent with diverticular bleed with blood seen at the splenic flexure.  The patient also was found to have a toupee wrap of the stomach and the GJ with dilation done at that time for dysphagia.  The patient now is being seen because of rectal bleeding and dysphagia.  Patient reports that her dysphagia was at the same time she was having rectal bleeding and lasted approximately about 5 days and then had improved.  The rectal bleeding was so severe when I that she states that she had bleeding through her undergarments and through the sheets down to the mattress pad and into the mattress.  She also reports that she has 1 external hemorrhoid.  The patient does report that the dysphagia has improved but she still has food feeling like is not going down approximately once a week.  Past Medical History:  Diagnosis Date   Age-related osteoporosis without current pathological fracture 09/09/2021   Allergy    Anemia 2000   estimate   Blood transfusion without reported diagnosis 04/07/2017   estimate   Bursitis    CKD (chronic kidney disease) stage 3, GFR 30-59 ml/min (HCC)    Diverticulosis    GERD (gastroesophageal reflux disease)    Hiatal hernia    Hypertension    Migraine with aura    states she doesn't have a headache, just the aura   Mixed hyperlipidemia    Osteoarthritis of knee    Thyroid disease     Current  Outpatient Medications  Medication Sig Dispense Refill   alendronate (FOSAMAX) 70 MG tablet TAKE 1 TABLET BY MOUTH EVERY 7 DAYS TAKE WITH A FULL GLASS OF WATER ON AN EMPTY STOMACH 12 tablet 0   aspirin EC 81 MG tablet Take 81 mg by mouth every other day.     cetirizine (ZYRTEC) 10 MG tablet Take 10 mg by mouth daily. otc     Cholecalciferol (VITAMIN D-3) 1000 units CAPS Take 1 capsule by mouth daily.     dicyclomine (BENTYL) 10 MG capsule TAKE 1 CAPSULE BY MOUTH THREE TIMES DAILY BEFORE MEAL(S) 90 capsule 3   fluticasone (FLONASE) 50 MCG/ACT nasal spray Place 2 sprays into both nostrils daily. PRN 16 g 2   levothyroxine (EUTHYROX) 88 MCG tablet Take 1 tablet (88 mcg total) by mouth daily. First thing in the morning on empty stomach 90 tablet 3   losartan (COZAAR) 100 MG tablet Take 100 mg by mouth daily.     lovastatin (MEVACOR) 20 MG tablet Take 1 tablet (20 mg total) by mouth at bedtime. 90 tablet 3   montelukast (SINGULAIR) 10 MG tablet Take 1 tablet (10 mg total) by mouth daily. 90 tablet 3   No current facility-administered medications for this visit.    Allergies as of 09/22/2021 - Review Complete 09/22/2021  Allergen Reaction Noted   Lisinopril  05/01/2019   Codeine  Nausea Only and Nausea And Vomiting 12/24/2013    ROS:  General: Negative for anorexia, weight loss, fever, chills, fatigue, weakness. ENT: Negative for hoarseness, difficulty swallowing , nasal congestion. CV: Negative for chest pain, angina, palpitations, dyspnea on exertion, peripheral edema.  Respiratory: Negative for dyspnea at rest, dyspnea on exertion, cough, sputum, wheezing.  GI: See history of present illness. GU:  Negative for dysuria, hematuria, urinary incontinence, urinary frequency, nocturnal urination.  Endo: Negative for unusual weight change.    Physical Examination:   BP 132/67   Pulse 63   Temp 97.9 F (36.6 C) (Oral)   Ht '5\' 3"'$  (1.6 m)   Wt 156 lb (70.8 kg)   BMI 27.63 kg/m   General:  Well-nourished, well-developed in no acute distress.  Eyes: No icterus. Conjunctivae pink. Neuro: Alert and oriented x 3.  Grossly intact. Skin: Warm and dry, no jaundice.   Psych: Alert and cooperative, normal mood and affect.  Labs:    Imaging Studies: No results found.  Assessment and Plan:   Pamela Hendrix is a 76 y.o. y/o female who comes in with rectal bleeding and a history of hemorrhoids.  The patient had a normal colonoscopy by Dr. Marius Ditch in the past.  The patient will be set up for repeat EGD for her dysphagia and history of esophageal stricture.  The patient will also be set up for hemorrhoidal banding due to her bothersome hemorrhoids.  The patient has been explained the plan agrees with it.     Lucilla Lame, MD. Marval Regal    Note: This dictation was prepared with Dragon dictation along with smaller phrase technology. Any transcriptional errors that result from this process are unintentional.

## 2021-10-06 ENCOUNTER — Encounter: Payer: Self-pay | Admitting: Gastroenterology

## 2021-10-18 ENCOUNTER — Other Ambulatory Visit: Payer: Self-pay | Admitting: Family Medicine

## 2021-10-18 NOTE — Telephone Encounter (Signed)
Medication Refill - Medication: alendronate (FOSAMAX) 70 MG tablet   Has the patient contacted their pharmacy? Yes.   (Agent: If no, request that the patient contact the pharmacy for the refill. If patient does not wish to contact the pharmacy document the reason why and proceed with request.) (Agent: If yes, when and what did the pharmacy advise?) the pharmacy called in request and sent a fax  Preferred Pharmacy (with phone number or street name):  [facility] [address], [place] to [facility] [facility] Phone:  [phone]  Fax:  [phone]     Has the patient been seen for an appointment in the last year OR does the patient have an upcoming appointment? Yes.    Agent: Please be advised that RX refills may take up to 3 business days. We ask that you follow-up with your pharmacy.

## 2021-10-19 MED ORDER — ALENDRONATE SODIUM 70 MG PO TABS: ORAL_TABLET | ORAL | 0 refills | Status: DC

## 2021-10-19 NOTE — Telephone Encounter (Signed)
Requested medication (s) are due for refill today: yes  Requested medication (s) are on the active medication list: yes  Last refill:  07/08/21 #12/0  Future visit scheduled: yes  Notes to clinic:  Unable to refill per protocol due to failed labs, no updated results.    Requested Prescriptions  Pending Prescriptions Disp Refills   alendronate (FOSAMAX) 70 MG tablet 12 tablet 0    Sig: TAKE 1 TABLET BY MOUTH EVERY 7 DAYS TAKE WITH A FULL GLASS OF WATER ON AN EMPTY STOMACH     Endocrinology:  Bisphosphonates Failed - 10/18/2021 12:00 PM      Failed - Vitamin D in normal range and within 360 days    Vit D, 25-Hydroxy  Date Value Ref Range Status  05/06/2019 33 30 - 100 ng/mL Final    Comment:    Vitamin D Status         25-OH Vitamin D: . Deficiency:                    <20 ng/mL Insufficiency:             20 - 29 ng/mL Optimal:                 > or = 30 ng/mL . For 25-OH Vitamin D testing on patients on  D2-supplementation and patients for whom quantitation  of D2 and D3 fractions is required, the [facility](TM) 25-OH VIT D, (D2,D3), LC/MS/MS is recommended: order  code [phone] (patients >55yrs). See Note 1 . Note 1 . For additional information, please refer to  http://education.[facility].com/faq/FAQ199  (This link is being provided for informational/ educational purposes only.)          Failed - Cr in normal range and within 360 days    Creat  Date Value Ref Range Status  09/13/2021 1.24 (H) 0.60 - 1.00 mg/dL Final         Failed - Mg Level in normal range and within 360 days    Magnesium  Date Value Ref Range Status  09/03/2019 2.2 1.5 - 2.5 mg/dL Final         Failed - Phosphate in normal range and within 360 days    Phosphorus  Date Value Ref Range Status  09/07/2016 3.7 2.5 - 4.5 mg/dL Final         Passed - Ca in normal range and within 360 days    Calcium  Date Value Ref Range Status  09/13/2021 8.9 8.6 - 10.4 mg/dL Final   Calcium, Total   Date Value Ref Range Status  12/05/2013 9.1 8.5 - 10.1 mg/dL Final         Passed - eGFR is 30 or above and within 360 days    GFR, Est African American  Date Value Ref Range Status  08/20/2020 50 (L) > OR = 60 mL/min/1.26m2 Final   GFR, Est Non African American  Date Value Ref Range Status  08/20/2020 43 (L) > OR = 60 mL/min/1.29m2 Final   eGFR  Date Value Ref Range Status  09/13/2021 45 (L) > OR = 60 mL/min/1.31m2 Final    Comment:    The eGFR is based on the CKD-EPI 2021 equation. To calculate  the new eGFR from a previous Creatinine or Cystatin C result, go to https://www.kidney.org/professionals/ kdoqi/gfr%5Fcalculator          Passed - Valid encounter within last 12 months    Recent Outpatient Visits  1 month ago Annual physical exam   [facility] Bo Merino, FNP   1 month ago Primary hypertension   [facility] Mecum, Dani Gobble, PA-C   6 months ago Low back pain radiating to both legs   [place], FNP   7 months ago Primary hypertension   [place], [place], DO   1 year ago Stage 3a chronic kidney disease [facility])   [facility] Delsa Grana, PA-C       Future Appointments             In 2 weeks Jonathon Bellows, MD [place]   In 6 months  [facility], [place] Density or Dexa Scan completed in the last 2 years

## 2021-10-21 ENCOUNTER — Encounter
Admission: RE | Disposition: A | Discharge status: Home / Self Care | Payer: Self-pay | Source: Home / Self Care | Attending: Gastroenterology

## 2021-10-21 ENCOUNTER — Ambulatory Visit
Admission: RE | Admit: 2021-10-21 | Discharge: 2021-10-21 | Disposition: A | Discharge status: Home / Self Care | Payer: Medicare HMO | Attending: Gastroenterology | Admitting: Gastroenterology

## 2021-10-21 ENCOUNTER — Ambulatory Visit: Payer: Medicare HMO | Admitting: Anesthesiology

## 2021-10-21 ENCOUNTER — Other Ambulatory Visit: Payer: Self-pay

## 2021-10-21 ENCOUNTER — Encounter: Payer: Self-pay | Admitting: Gastroenterology

## 2021-10-21 DIAGNOSIS — K222 Esophageal obstruction: Secondary | ICD-10-CM | POA: Diagnosis not present

## 2021-10-21 DIAGNOSIS — R1319 Other dysphagia: Secondary | ICD-10-CM | POA: Diagnosis not present

## 2021-10-21 DIAGNOSIS — K5900 Constipation, unspecified: Secondary | ICD-10-CM | POA: Diagnosis not present

## 2021-10-21 DIAGNOSIS — D759 Disease of blood and blood-forming organs, unspecified: Secondary | ICD-10-CM | POA: Insufficient documentation

## 2021-10-21 DIAGNOSIS — Z9889 Other specified postprocedural states: Secondary | ICD-10-CM | POA: Insufficient documentation

## 2021-10-21 DIAGNOSIS — D649 Anemia, unspecified: Secondary | ICD-10-CM | POA: Diagnosis not present

## 2021-10-21 DIAGNOSIS — K625 Hemorrhage of anus and rectum: Secondary | ICD-10-CM | POA: Diagnosis not present

## 2021-10-21 DIAGNOSIS — N183 Chronic kidney disease, stage 3 unspecified: Secondary | ICD-10-CM | POA: Diagnosis not present

## 2021-10-21 DIAGNOSIS — R1310 Dysphagia, unspecified: Secondary | ICD-10-CM | POA: Insufficient documentation

## 2021-10-21 DIAGNOSIS — I129 Hypertensive chronic kidney disease with stage 1 through stage 4 chronic kidney disease, or unspecified chronic kidney disease: Secondary | ICD-10-CM | POA: Insufficient documentation

## 2021-10-21 DIAGNOSIS — E039 Hypothyroidism, unspecified: Secondary | ICD-10-CM | POA: Diagnosis not present

## 2021-10-21 HISTORY — DX: Bradycardia, unspecified: R00.1

## 2021-10-21 HISTORY — DX: Hypothyroidism, unspecified: E03.9

## 2021-10-21 HISTORY — PX: ESOPHAGEAL DILATION: SHX303

## 2021-10-21 HISTORY — PX: ESOPHAGOGASTRODUODENOSCOPY: SHX5428

## 2021-10-21 SURGERY — EGD (ESOPHAGOGASTRODUODENOSCOPY)
Anesthesia: General | Site: Mouth

## 2021-10-21 MED ORDER — SODIUM CHLORIDE 0.9 % IV SOLN: INTRAVENOUS | Status: DC

## 2021-10-21 MED ORDER — GLYCOPYRROLATE 0.2 MG/ML IJ SOLN
INTRAMUSCULAR | Status: DC | PRN
  Administered 2021-10-21: .2 mg via INTRAVENOUS

## 2021-10-21 MED ORDER — LIDOCAINE HCL (CARDIAC) PF 100 MG/5ML IV SOSY
PREFILLED_SYRINGE | INTRAVENOUS | Status: DC | PRN
  Administered 2021-10-21: 40 mg via INTRAVENOUS

## 2021-10-21 MED ORDER — PROPOFOL 10 MG/ML IV BOLUS
INTRAVENOUS | Status: DC | PRN
  Administered 2021-10-21: 20 mg via INTRAVENOUS
  Administered 2021-10-21 (×2): 50 mg via INTRAVENOUS

## 2021-10-21 MED ORDER — STERILE WATER FOR IRRIGATION IR SOLN
Status: DC | PRN
  Administered 2021-10-21: 1

## 2021-10-21 MED ORDER — LACTATED RINGERS IV SOLN: INTRAVENOUS | Status: DC

## 2021-10-21 SURGICAL SUPPLY — 12 items
BALLN DILATOR 12-15 8 (BALLOONS) ×3
BALLN DILATOR 15-18 8 (BALLOONS) ×3
BALLOON DILATOR 12-15 8 (BALLOONS) IMPLANT
BALLOON DILATOR 15-18 8 (BALLOONS) IMPLANT
BLOCK BITE 60FR ADLT L/F GRN (MISCELLANEOUS) ×3 IMPLANT
GOWN CVR UNV OPN BCK APRN NK (MISCELLANEOUS) ×4 IMPLANT
GOWN ISOL THUMB LOOP REG UNIV (MISCELLANEOUS) ×6
KIT PRC NS LF DISP ENDO (KITS) ×2 IMPLANT
KIT PROCEDURE OLYMPUS (KITS) ×3
MANIFOLD NEPTUNE II (INSTRUMENTS) ×3 IMPLANT
SYR INFLATION 60ML (SYRINGE) ×2 IMPLANT
WATER STERILE IRR 250ML POUR (IV SOLUTION) ×3 IMPLANT

## 2021-10-21 NOTE — Interval H&P Note (Signed)
Lucilla Lame, MD [facility] [address] [place], [place] [address] Phone:[phone] Fax : [phone]  Primary Care Physician:  Delsa Grana, PA-C Primary Gastroenterologist:  Dr. Allen Norris  Pre-Procedure History & Physical: HPI:  Pamela Hendrix is a 76 y.o. female is here for an endoscopy.   Past Medical History:  Diagnosis Date   Age-related osteoporosis without current pathological fracture 09/09/2021   Allergy    Anemia 2000   estimate   Anemia    Blood transfusion without reported diagnosis 04/07/2017   estimate   Bradycardia    Bursitis    CKD (chronic kidney disease) stage 3, GFR 30-59 ml/min (HCC)    Diverticulosis    GERD (gastroesophageal reflux disease)    Hiatal hernia    Hypertension    Hypothyroidism    Migraine with aura    states she doesn't have a headache, just the aura   Mixed hyperlipidemia    Osteoarthritis of knee    Thyroid disease     Past Surgical History:  Procedure Laterality Date   CHOLECYSTECTOMY     COLONOSCOPY  06/02/2014   cleared for 10 yrs- Dr Rayann Heman   COLONOSCOPY N/A 04/07/2017   Procedure: COLONOSCOPY;  Surgeon: Lin Landsman, MD;  Location: [facility];  Service: Gastroenterology;  Laterality: N/A;   ESOPHAGEAL DILATION     ESOPHAGOGASTRODUODENOSCOPY N/A 04/07/2017   Procedure: ESOPHAGOGASTRODUODENOSCOPY (EGD);  Surgeon: Lin Landsman, MD;  Location: [facility] END[facility]COPY;  Service: Gastroenterology;  Laterality: N/A;   HERNIA REPAIR  08/28/2014   estimate   TONSILLECTOMY     TUBAL LIGATION  1975   estimate   UPPER GI ENDOSCOPY  06/02/2014    Prior to Admission medications   Medication Sig Start Date End Date Taking? Authorizing Provider  alendronate (FOSAMAX) 70 MG tablet TAKE 1 TABLET BY MOUTH EVERY 7 DAYS TAKE WITH A FULL GLASS OF WATER ON AN EMPTY STOMACH 10/19/21  Yes Bo Merino, FNP  aspirin EC 81 MG tablet Take 81 mg by mouth every other day.   Yes [provider]  cetirizine  (ZYRTEC) 10 MG tablet Take 10 mg by mouth daily. otc   Yes [provider]  Cholecalciferol (VITAMIN D-3) 1000 units CAPS Take 1 capsule by mouth daily.   Yes [provider]  dicyclomine (BENTYL) 10 MG capsule TAKE 1 CAPSULE BY MOUTH THREE TIMES DAILY BEFORE MEAL(S) 11/19/20  Yes Lucilla Lame, MD  fluticasone (FLONASE) 50 MCG/ACT nasal spray Place 2 sprays into both nostrils daily. PRN 09/03/19  Yes Delsa Grana, PA-C  levothyroxine (EUTHYROX) 88 MCG tablet Take 1 tablet (88 mcg total) by mouth daily. First thing in the morning on empty stomach 08/20/21  Yes Delsa Grana, PA-C  losartan (COZAAR) 100 MG tablet Take 100 mg by mouth daily. 12/12/19  Yes [provider]  lovastatin (MEVACOR) 20 MG tablet Take 1 tablet (20 mg total) by mouth at bedtime. 08/20/21  Yes Delsa Grana, PA-C  montelukast (SINGULAIR) 10 MG tablet Take 1 tablet (10 mg total) by mouth daily. 08/20/21  Yes Delsa Grana, PA-C    Allergies as of 09/22/2021 - Review Complete 09/22/2021  Allergen Reaction Noted   Lisinopril  05/01/2019   Codeine Nausea Only and Nausea And Vomiting 12/24/2013    Family History  Problem Relation Age of Onset   Kidney disease Mother    Cancer Mother        brain   Arthritis Mother    Obesity Mother    Varicose  Veins Mother    Heart disease Father    Heart attack Father    Hearing loss Father    Hypertension Father    Lupus Sister    Diabetes Sister    Thyroid disease Sister    Rheum arthritis Sister    Arthritis Sister    Obesity Sister    Diabetes Sister    Arthritis Sister    Cancer Sister    Hearing loss Sister    Intellectual disability Sister    Learning disabilities Sister    Multiple myeloma Sister    Thyroid disease Sister    Rheum arthritis Sister    Arthritis Sister    Cancer Sister    Obesity Sister    Breast cancer Maternal Aunt    Miscarriages / [place] Daughter     Social History   Socioeconomic History   Marital status: Married     Spouse name: Doren Custard   Number of children: 1   Years of education: some college   Highest education level: 12th grade  Occupational History   Occupation: Retired  Tobacco Use   Smoking status: Never   Smokeless tobacco: Never   Tobacco comments:    Never, but father and husband smoked  Vaping Use   Vaping Use: Never used  Substance and Sexual Activity   Alcohol use: Not Currently    Alcohol/week: 0.0 standard drinks of alcohol    Comment: on special occasions like New Years Eve   Drug use: Never   Sexual activity: Not Currently    Birth control/protection: Post-menopausal, Surgical    Comment: tubes tied in my 30's, now post-menopausal  Other Topics Concern   Not on file  Social History Narrative   Not on file   Social Determinants of Health   Financial Resource Strain: Low Risk  (09/15/2021)   Overall Financial Resource Strain (CARDIA)    Difficulty of Paying Living Expenses: Not hard at all  Food Insecurity: No Food Insecurity (09/15/2021)   Hunger Vital Sign    Worried About Running Out of Food in the Last Year: Never true    [place] in the Last Year: Never true  Transportation Needs: No Transportation Needs (09/15/2021)   PRAPARE - Hydrologist (Medical): No    Lack of Transportation (Non-Medical): No  Physical Activity: Insufficiently Active (09/15/2021)   Exercise Vital Sign    Days of Exercise per Week: 1 day    Minutes of Exercise per Session: 40 min  Stress: No Stress Concern Present (09/15/2021)   [place]    Feeling of Stress : Only a little  Social Connections: Moderately Integrated (09/15/2021)   Social Connection and Isolation Panel [NHANES]    Frequency of Communication with Friends and Family: Three times a week    Frequency of Social Gatherings with Friends and Family: Once a week    Attends Religious Services: 1 to 4 times per year    Active Member of [employer]  or Organizations: No    Attends Archivist Meetings: Never    Marital Status: Married  Human resources officer Violence: Not At Risk (09/15/2021)   Humiliation, Afraid, Rape, and Kick questionnaire    Fear of Current or Ex-Partner: No    Emotionally Abused: No    Physically Abused: No    Sexually Abused: No    Review of Systems: See HPI, otherwise negative ROS  Physical Exam: Pulse 62  Temp 98 F (36.7 C) (Temporal)   Ht $R'5\' 2"'Lp$  (1.575 m)   Wt 68 kg   BMI 27.44 kg/m  General:   Alert,  pleasant and cooperative in NAD Head:  Normocephalic and atraumatic. Neck:  Supple; no masses or thyromegaly. Lungs:  Clear throughout to auscultation.    Heart:  Regular rate and rhythm. Abdomen:  Soft, nontender and nondistended. Normal bowel sounds, without guarding, and without rebound.   Neurologic:  Alert and  oriented x4;  grossly normal neurologically.  Impression/Plan: [name] is here for an endoscopy to be performed for dysphagia  Risks, benefits, limitations, and alternatives regarding  endoscopy have been reviewed with the patient.  Questions have been answered.  All parties agreeable.   Lucilla Lame, MD  10/21/2021, 7:08 AM

## 2021-10-21 NOTE — Op Note (Signed)
[facility] Gastroenterology Patient Name: Pamela Hendrix Procedure Date: 10/21/2021 7:19 AM MRN: [id number] Account #: [id number] Date of Birth: 03/26/1946 Admit Type: Outpatient Age: 76 Room: [facility] OR ROOM 01 Gender: Female Note Status: Finalized Instrument Name: [id number] Procedure:             Upper GI endoscopy Indications:           Dysphagia Providers:             Lucilla Lame MD, MD Referring MD:          Delsa Grana (Referring MD) Medicines:             Propofol per Anesthesia Complications:         No immediate complications. Procedure:             Pre-Anesthesia Assessment:                        - Prior to the procedure, a History and Physical was                         performed, and patient medications and allergies were                         reviewed. The patient's tolerance of previous                         anesthesia was also reviewed. The risks and benefits                         of the procedure and the sedation options and risks                         were discussed with the patient. All questions were                         answered, and informed consent was obtained. Prior                         Anticoagulants: The patient has taken no previous                         anticoagulant or antiplatelet agents. ASA Grade                         Assessment: II - A patient with mild systemic disease.                         After reviewing the risks and benefits, the patient                         was deemed in satisfactory condition to undergo the                         procedure.                        After obtaining informed consent, the endoscope was  passed under direct vision. Throughout the procedure,                         the patient's blood pressure, pulse, and oxygen                         saturations were monitored continuously. The Endoscope                         was introduced through the mouth, and  advanced to the                         second part of duodenum. The upper GI endoscopy was                         accomplished without difficulty. The patient tolerated                         the procedure well. Findings:      One benign-appearing, intrinsic moderate stenosis was found at the       gastroesophageal junction. The stenosis was traversed. A TTS dilator was       passed through the scope. Dilation with a 12-13.5-15 mm balloon dilator       was performed to 15 mm. A TTS dilator was passed through the scope.       Dilation with a 15-16.5-18 mm balloon dilator was performed to 18 mm.       The dilation site was examined following endoscope reinsertion and       showed no change.      The stomach was normal.      The examined duodenum was normal. Impression:            - Benign-appearing esophageal stenosis. Dilated.                        - Normal stomach.                        - Normal examined duodenum.                        - No specimens collected.                        - The stenosis my be due to the wrap being too tight. Recommendation:        - Discharge patient to home.                        - Resume previous diet.                        - Continue present medications. Procedure Code(s):     --- Professional ---                        [phone], Esophagogastroduodenoscopy, flexible,                         transoral; with transendoscopic balloon dilation of  esophagus (less than 30 mm diameter) Diagnosis Code(s):     --- Professional ---                        R13.10, Dysphagia, unspecified                        K22.2, Esophageal obstruction CPT copyright 2019 American Medical Association. All rights reserved. The codes documented in this report are preliminary and upon coder review may  be revised to meet current compliance requirements. Lucilla Lame MD, MD 10/21/2021 7:51:34 AM This report has been signed electronically. Number of  Addenda: 0 Note Initiated On: 10/21/2021 7:19 AM Total Procedure Duration: 0 hours 8 minutes 23 seconds  Estimated Blood Loss:  Estimated blood loss: none.      [facility]

## 2021-10-21 NOTE — Anesthesia Preprocedure Evaluation (Signed)
Anesthesia Evaluation  Patient identified by MRN, date of birth, ID band Patient awake    Reviewed: Allergy & Precautions, NPO status , Patient's Chart, lab work & pertinent test results, reviewed documented beta blocker date and time   History of Anesthesia Complications Negative for: history of anesthetic complications  Airway Mallampati: II  TM Distance: >3 FB Neck ROM: Limited    Dental   Pulmonary    breath sounds clear to auscultation       Cardiovascular hypertension, (-) angina(-) DOE  Rhythm:Regular Rate:Normal   HLD PFO  TTE (07/2018): NORMAL LEFT VENTRICULAR SYSTOLIC FUNCTION  NORMAL RIGHT VENTRICULAR SYSTOLIC FUNCTION  MODERATE MR & TR  NO VALVULAR STENOSIS  POSITIVE BUBBLE STUDY    Neuro/Psych  Headaches,    GI/Hepatic hiatal hernia, GERD  Controlled, Diverticulosis   Endo/Other  Hypothyroidism   Renal/GU CRFRenal disease     Musculoskeletal  (+) Arthritis , Osteoarthritis,    Abdominal   Peds  Hematology  (+) Blood dyscrasia, anemia ,   Anesthesia Other Findings   Reproductive/Obstetrics                             Anesthesia Physical Anesthesia Plan  ASA: 2  Anesthesia Plan: General   Post-op Pain Management:    Induction: Intravenous  PONV Risk Score and Plan: 3 and Propofol infusion, TIVA and Treatment may vary due to age or medical condition  Airway Management Planned: Natural Airway and Nasal Cannula  Additional Equipment:   Intra-op Plan:   Post-operative Plan:   Informed Consent: I have reviewed the patients History and Physical, chart, labs and discussed the procedure including the risks, benefits and alternatives for the proposed anesthesia with the patient or authorized representative who has indicated his/her understanding and acceptance.       Plan Discussed with: CRNA and Anesthesiologist  Anesthesia Plan Comments:          Anesthesia Quick Evaluation

## 2021-10-21 NOTE — Transfer of Care (Signed)
Immediate Anesthesia Transfer of Care Note  Patient: Pamela Hendrix  Procedure(s) Performed: ESOPHAGOGASTRODUODENOSCOPY (EGD) (Mouth) ESOPHAGEAL DILATION (Mouth)  Patient Location: PACU  Anesthesia Type: General  Level of Consciousness: awake, alert  and patient cooperative  Airway and Oxygen Therapy: Patient Spontanous Breathing and Patient connected to supplemental oxygen  Post-op Assessment: Post-op Vital signs reviewed, Patient's Cardiovascular Status Stable, Respiratory Function Stable, Patent Airway and No signs of Nausea or vomiting  Post-op Vital Signs: Reviewed and stable  Complications: No notable events documented.

## 2021-10-21 NOTE — Anesthesia Postprocedure Evaluation (Signed)
Anesthesia Post Note  Patient: Devinn Voshell  Procedure(s) Performed: ESOPHAGOGASTRODUODENOSCOPY (EGD) (Mouth) ESOPHAGEAL DILATION (Mouth)     Patient location during evaluation: PACU Anesthesia Type: General Level of consciousness: awake and alert Pain management: pain level controlled Vital Signs Assessment: post-procedure vital signs reviewed and stable Respiratory status: spontaneous breathing, nonlabored ventilation, respiratory function stable and patient connected to nasal cannula oxygen Cardiovascular status: blood pressure returned to baseline and stable Postop Assessment: no apparent nausea or vomiting Anesthetic complications: no   No notable events documented.  Alanya Vukelich A  Alona Danford

## 2021-10-22 ENCOUNTER — Encounter: Payer: Self-pay | Admitting: Gastroenterology

## 2021-11-02 ENCOUNTER — Ambulatory Visit: Payer: Medicare HMO | Admitting: Gastroenterology

## 2021-11-02 ENCOUNTER — Encounter: Payer: Self-pay | Admitting: Gastroenterology

## 2021-11-02 ENCOUNTER — Other Ambulatory Visit: Payer: Self-pay

## 2021-11-02 VITALS — BP 145/69 | HR 62 | Temp 98.8°F | Wt 154.0 lb

## 2021-11-02 DIAGNOSIS — K625 Hemorrhage of anus and rectum: Secondary | ICD-10-CM

## 2021-11-02 MED ORDER — NA SULFATE-K SULFATE-MG SULF 17.5-3.13-1.6 GM/177ML PO SOLN: 354.0000 mL | Freq: Once | ORAL | 0 refills | Status: AC

## 2021-11-02 NOTE — Progress Notes (Unsigned)
Jonathon Bellows MD, MRCP(U.K) [address]  [place]  [place], [place] [address]  Main: [phone]  Fax: [phone]   Primary Care Physician: Delsa Grana, PA-C  Primary Gastroenterologist:  Dr. Jonathon Bellows   Chief Complaint  Patient presents with   banding #1    HPI: Pamela Hendrix is a 76 y.o. female   Here today to see me for rectal bleeding . In 2018 colonoscopy by Dr Marius Ditch showed internal and external hemorrhoids.   She states that in March 2023 had an episode of painless rectal bleeding where her whole bed was drenched with blood subsequently resolved and since then had some perianal discomfort as well as a sensation of something hanging out from her anus.  No significant bleeding as such.  No change in bowel habits. Current Outpatient Medications  Medication Sig Dispense Refill   alendronate (FOSAMAX) 70 MG tablet TAKE 1 TABLET BY MOUTH EVERY 7 DAYS TAKE WITH A FULL GLASS OF WATER ON AN EMPTY STOMACH 12 tablet 0   aspirin EC 81 MG tablet Take 81 mg by mouth every other day.     cetirizine (ZYRTEC) 10 MG tablet Take 10 mg by mouth daily. otc     Cholecalciferol (VITAMIN D-3) 1000 units CAPS Take 1 capsule by mouth daily.     dicyclomine (BENTYL) 10 MG capsule TAKE 1 CAPSULE BY MOUTH THREE TIMES DAILY BEFORE MEAL(S) 90 capsule 3   fluticasone (FLONASE) 50 MCG/ACT nasal spray Place 2 sprays into both nostrils daily. PRN 16 g 2   levothyroxine (EUTHYROX) 88 MCG tablet Take 1 tablet (88 mcg total) by mouth daily. First thing in the morning on empty stomach 90 tablet 3   losartan (COZAAR) 100 MG tablet Take 100 mg by mouth daily.     lovastatin (MEVACOR) 20 MG tablet Take 1 tablet (20 mg total) by mouth at bedtime. 90 tablet 3   montelukast (SINGULAIR) 10 MG tablet Take 1 tablet (10 mg total) by mouth daily. 90 tablet 3   No current facility-administered medications for this visit.    Allergies as of 11/02/2021 - Review Complete 11/02/2021  Allergen Reaction  Noted   Lisinopril  05/01/2019   Codeine Nausea Only and Nausea And Vomiting 12/24/2013    ROS:  General: Negative for anorexia, weight loss, fever, chills, fatigue, weakness. ENT: Negative for hoarseness, difficulty swallowing , nasal congestion. CV: Negative for chest pain, angina, palpitations, dyspnea on exertion, peripheral edema.  Respiratory: Negative for dyspnea at rest, dyspnea on exertion, cough, sputum, wheezing.  GI: See history of present illness. GU:  Negative for dysuria, hematuria, urinary incontinence, urinary frequency, nocturnal urination.  Endo: Negative for unusual weight change.    Physical Examination:   BP (!) 145/69   Pulse 62   Temp 98.8 F (37.1 C) (Oral)   Wt 154 lb (69.9 kg)   BMI 28.17 kg/m   General: Well-nourished, well-developed in no acute distress.  Eyes: No icterus. Conjunctivae pink. Mouth: Oropharyngeal mucosa moist and pink , no lesions erythema or exudate. Neuro: Alert and oriented x 3.  Grossly intact. Skin: Warm and dry, no jaundice.   Psych: Alert and cooperative, normal mood and affect.   Imaging Studies: No results found.  Assessment and Plan:   Pamela Hendrix is a 76 y.o. y/o female here today to see me for possible banding of internal hemorrhoids.  She has had a colonoscopy 5 years back by Dr. Marius Ditch which showed internal hemorrhoids diverticulosis and in March  2023 she had an episode of painless rectal hemorrhage very likely to have been secondary to a diverticular bleed based on her history presently she has some perianal discomfort but does not have significant rectal bleeding.  Her present symptoms could be from hemorrhoids but I would like to confirm that there is no other pathology in the colon hence recommended a colonoscopy which she has agreed to which I will perform on Thursday and if negative we can band her hemorrhoids at my office next week   I have discussed alternative options, risks & benefits,  which  include, but are not limited to, bleeding, infection, perforation,respiratory complication & drug reaction.  The patient agrees with this plan & written consent will be obtained.       Dr Jonathon Bellows  MD,MRCP [facility]) Follow up in 1 week

## 2021-11-04 ENCOUNTER — Encounter
Admission: RE | Disposition: A | Discharge status: Home / Self Care | Payer: Self-pay | Source: Home / Self Care | Attending: Gastroenterology

## 2021-11-04 ENCOUNTER — Ambulatory Visit: Payer: Medicare HMO | Admitting: Anesthesiology

## 2021-11-04 ENCOUNTER — Ambulatory Visit
Admission: RE | Admit: 2021-11-04 | Discharge: 2021-11-04 | Disposition: A | Discharge status: Home / Self Care | Payer: Medicare HMO | Attending: Gastroenterology | Admitting: Gastroenterology

## 2021-11-04 ENCOUNTER — Encounter: Payer: Self-pay | Admitting: Gastroenterology

## 2021-11-04 DIAGNOSIS — K640 First degree hemorrhoids: Secondary | ICD-10-CM | POA: Diagnosis not present

## 2021-11-04 DIAGNOSIS — K625 Hemorrhage of anus and rectum: Secondary | ICD-10-CM | POA: Diagnosis not present

## 2021-11-04 DIAGNOSIS — K5730 Diverticulosis of large intestine without perforation or abscess without bleeding: Secondary | ICD-10-CM | POA: Diagnosis not present

## 2021-11-04 DIAGNOSIS — K645 Perianal venous thrombosis: Secondary | ICD-10-CM | POA: Insufficient documentation

## 2021-11-04 DIAGNOSIS — K56699 Other intestinal obstruction unspecified as to partial versus complete obstruction: Secondary | ICD-10-CM | POA: Insufficient documentation

## 2021-11-04 DIAGNOSIS — E039 Hypothyroidism, unspecified: Secondary | ICD-10-CM | POA: Diagnosis not present

## 2021-11-04 HISTORY — PX: COLONOSCOPY WITH PROPOFOL: SHX5780

## 2021-11-04 LAB — CBC
HCT: 29.3 % — ABNORMAL LOW (ref 36.0–46.0)
Hemoglobin: 9.4 g/dL — ABNORMAL LOW (ref 12.0–15.0)
MCH: 32.3 pg (ref 26.0–34.0)
MCHC: 32.1 g/dL (ref 30.0–36.0)
MCV: 100.7 fL — ABNORMAL HIGH (ref 80.0–100.0)
Platelets: 144 10*3/uL — ABNORMAL LOW (ref 150–400)
RBC: 2.91 MIL/uL — ABNORMAL LOW (ref 3.87–5.11)
RDW: 13.9 % (ref 11.5–15.5)
WBC: 11.9 10*3/uL — ABNORMAL HIGH (ref 4.0–10.5)
nRBC: 0 % (ref 0.0–0.2)

## 2021-11-04 SURGERY — COLONOSCOPY WITH PROPOFOL
Anesthesia: General

## 2021-11-04 MED ORDER — PROPOFOL 500 MG/50ML IV EMUL
INTRAVENOUS | Status: DC | PRN
  Administered 2021-11-04: 150 ug/kg/min via INTRAVENOUS

## 2021-11-04 MED ORDER — SODIUM CHLORIDE (PF) 0.9 % IJ SOLN
PREFILLED_SYRINGE | INTRAMUSCULAR | Status: DC | PRN
  Administered 2021-11-04: 3 mL

## 2021-11-04 MED ORDER — SODIUM CHLORIDE 0.9 % IV SOLN: INTRAVENOUS | Status: DC

## 2021-11-04 MED ORDER — PROPOFOL 10 MG/ML IV BOLUS
INTRAVENOUS | Status: DC | PRN
  Administered 2021-11-04: 30 mg via INTRAVENOUS
  Administered 2021-11-04: 70 mg via INTRAVENOUS

## 2021-11-04 MED ORDER — EPINEPHRINE 1 MG/10ML IJ SOSY
PREFILLED_SYRINGE | INTRAMUSCULAR | Status: AC
  Filled 2021-11-04: qty 10

## 2021-11-04 MED ORDER — LIDOCAINE 2% (20 MG/ML) 5 ML SYRINGE
INTRAMUSCULAR | Status: DC | PRN
  Administered 2021-11-04: 50 mg via INTRAVENOUS

## 2021-11-04 NOTE — H&P (Signed)
Jonathon Bellows, MD [address], [place], [place], [place], [address] [address], [place], [place], [place], [address] Phone: [phone]  Fax: [phone]  Primary Care Physician:  Delsa Grana, PA-C   Pre-Procedure History & Physical: HPI:  Pamela Hendrix is a 76 y.o. female is here for an colonoscopy.   Past Medical History:  Diagnosis Date   Age-related osteoporosis without current pathological fracture 09/09/2021   Allergy    Anemia 2000   estimate   Anemia    Blood transfusion without reported diagnosis 04/07/2017   estimate   Bradycardia    Bursitis    CKD (chronic kidney disease) stage 3, GFR 30-59 ml/min (HCC)    Diverticulosis    GERD (gastroesophageal reflux disease)    Hiatal hernia    Hypertension    Hypothyroidism    Migraine with aura    states she doesn't have a headache, just the aura   Mixed hyperlipidemia    Osteoarthritis of knee    Thyroid disease     Past Surgical History:  Procedure Laterality Date   CHOLECYSTECTOMY     COLONOSCOPY  06/02/2014   cleared for 10 yrs- Dr Rayann Heman   COLONOSCOPY N/A 04/07/2017   Procedure: COLONOSCOPY;  Surgeon: Lin Landsman, MD;  Location: [facility];  Service: Gastroenterology;  Laterality: N/A;   ESOPHAGEAL DILATION     ESOPHAGEAL DILATION  10/21/2021   Procedure: ESOPHAGEAL DILATION;  Surgeon: Lucilla Lame, MD;  Location: [facility];  Service: Endoscopy;;  12-18 mm   ESOPHAGOGASTRODUODENOSCOPY N/A 04/07/2017   Procedure: ESOPHAGOGASTRODUODENOSCOPY (EGD);  Surgeon: Lin Landsman, MD;  Location: [facility] ENDOSCOPY;  Service: Gastroenterology;  Laterality: N/A;   ESOPHAGOGASTRODUODENOSCOPY N/A 10/21/2021   Procedure: ESOPHAGOGASTRODUODENOSCOPY (EGD);  Surgeon: Lucilla Lame, MD;  Location: [facility];  Service: Endoscopy;  Laterality: N/A;   HERNIA REPAIR  08/28/2014   estimate   TONSILLECTOMY     TUBAL LIGATION  1975   estimate   UPPER GI ENDOSCOPY  06/02/2014     Prior to Admission medications   Medication Sig Start Date End Date Taking? Authorizing Provider  aspirin EC 81 MG tablet Take 81 mg by mouth every other day.   Yes [provider]  cetirizine (ZYRTEC) 10 MG tablet Take 10 mg by mouth daily. otc   Yes [provider]  Cholecalciferol (VITAMIN D-3) 1000 units CAPS Take 1 capsule by mouth daily.   Yes [provider]  dicyclomine (BENTYL) 10 MG capsule TAKE 1 CAPSULE BY MOUTH THREE TIMES DAILY BEFORE MEAL(S) 11/19/20  Yes Lucilla Lame, MD  fluticasone (FLONASE) 50 MCG/ACT nasal spray Place 2 sprays into both nostrils daily. PRN 09/03/19  Yes Delsa Grana, PA-C  levothyroxine (EUTHYROX) 88 MCG tablet Take 1 tablet (88 mcg total) by mouth daily. First thing in the morning on empty stomach 08/20/21  Yes Delsa Grana, PA-C  losartan (COZAAR) 100 MG tablet Take 100 mg by mouth daily. 12/12/19  Yes [provider]  lovastatin (MEVACOR) 20 MG tablet Take 1 tablet (20 mg total) by mouth at bedtime. 08/20/21  Yes Delsa Grana, PA-C  montelukast (SINGULAIR) 10 MG tablet Take 1 tablet (10 mg total) by mouth daily. 08/20/21  Yes Delsa Grana, PA-C  alendronate (FOSAMAX) 70 MG tablet TAKE 1 TABLET BY MOUTH EVERY 7 DAYS TAKE WITH A FULL GLASS OF WATER ON AN EMPTY STOMACH 10/19/21   Bo Merino, FNP    Allergies as of 11/02/2021 - Review Complete 11/02/2021  Allergen Reaction Noted   Lisinopril  05/01/2019   Codeine Nausea Only and Nausea And Vomiting 12/24/2013    Family History  Problem Relation Age of Onset   Kidney disease Mother    Cancer Mother        brain   Arthritis Mother    Obesity Mother    Varicose Veins Mother    Heart disease Father    Heart attack Father    Hearing loss Father    Hypertension Father    Lupus Sister    Diabetes Sister    Thyroid disease Sister    Rheum arthritis Sister    Arthritis Sister    Obesity Sister    Diabetes Sister    Arthritis Sister    Cancer Sister    Hearing loss  Sister    Intellectual disability Sister    Learning disabilities Sister    Multiple myeloma Sister    Thyroid disease Sister    Rheum arthritis Sister    Arthritis Sister    Cancer Sister    Obesity Sister    Breast cancer Maternal Aunt    Miscarriages / [place] Daughter     Social History   Socioeconomic History   Marital status: Married    Spouse name: Doren Custard   Number of children: 1   Years of education: some college   Highest education level: 12th grade  Occupational History   Occupation: Retired  Tobacco Use   Smoking status: Never   Smokeless tobacco: Never   Tobacco comments:    Never, but father and husband smoked  Vaping Use   Vaping Use: Never used  Substance and Sexual Activity   Alcohol use: Not Currently    Alcohol/week: 0.0 standard drinks of alcohol    Comment: on special occasions like New Years Eve   Drug use: Never   Sexual activity: Not Currently    Birth control/protection: Post-menopausal, Surgical    Comment: tubes tied in my 30's, now post-menopausal  Other Topics Concern   Not on file  Social History Narrative   Not on file   Social Determinants of Health   Financial Resource Strain: Low Risk  (09/15/2021)   Overall Financial Resource Strain (CARDIA)    Difficulty of Paying Living Expenses: Not hard at all  Food Insecurity: No Food Insecurity (09/15/2021)   Hunger Vital Sign    Worried About Running Out of Food in the Last Year: Never true    [place] in the Last Year: Never true  Transportation Needs: No Transportation Needs (09/15/2021)   PRAPARE - Hydrologist (Medical): No    Lack of Transportation (Non-Medical): No  Physical Activity: Insufficiently Active (09/15/2021)   Exercise Vital Sign    Days of Exercise per Week: 1 day    Minutes of Exercise per Session: 40 min  Stress: No Stress Concern Present (09/15/2021)   [place]     Feeling of Stress : Only a little  Social Connections: Moderately Integrated (09/15/2021)   Social Connection and Isolation Panel [NHANES]    Frequency of Communication with Friends and Family: Three times a week    Frequency of Social Gatherings with Friends and Family: Once a week    Attends Religious Services: 1 to 4 times per year    Active Member of [employer] or Organizations: No    Attends Archivist Meetings: Never    Marital Status: Married  Intimate Partner Violence: Not At Risk (09/15/2021)   Humiliation, Afraid, Rape, and Kick questionnaire    Fear of Current or Ex-Partner: No    Emotionally Abused: No    Physically Abused: No    Sexually Abused: No    Review of Systems: See HPI, otherwise negative ROS  Physical Exam: BP (!) 146/74   Pulse 77   Temp (!) 96.6 F (35.9 C) (Temporal)   Resp 17   Ht _0  (1.575 m)   Wt 67.9 kg   SpO2 100%   BMI 27.40 kg/m  General:   Alert,  pleasant and cooperative in NAD Head:  Normocephalic and atraumatic. Neck:  Supple; no masses or thyromegaly. Lungs:  Clear throughout to auscultation, normal respiratory effort.    Heart:  +S1, +S2, Regular rate and rhythm, No edema. Abdomen:  Soft, nontender and nondistended. Normal bowel sounds, without guarding, and without rebound.   Neurologic:  Alert and  oriented x4;  grossly normal neurologically.  Impression/Plan: Pamela Hendrix is here for an colonoscopy to be performed for rectal bleeding  Risks, benefits, limitations, and alternatives regarding  colonoscopy have been reviewed with the patient.  Questions have been answered.  All parties agreeable.   Jonathon Bellows, MD  11/04/2021, 10:37 AM

## 2021-11-04 NOTE — Transfer of Care (Signed)
Immediate Anesthesia Transfer of Care Note  Patient: Pamela Hendrix  Procedure(s) Performed: COLONOSCOPY WITH PROPOFOL  Patient Location: Endoscopy Unit  Anesthesia Type:General  Level of Consciousness: drowsy  Airway & Oxygen Therapy: Patient Spontanous Breathing  Post-op Assessment: Report given to RN and Post -op Vital signs reviewed and stable  Post vital signs: Reviewed and stable  Last Vitals:  Vitals Value Taken Time  BP 123/66 11/04/21 1110  Temp 35.9 C 11/04/21 1110  Pulse 84 11/04/21 1110  Resp 15 11/04/21 1110  SpO2 99 % 11/04/21 1110    Last Pain:  Vitals:   11/04/21 1110  TempSrc: Temporal  PainSc: Asleep         Complications: No notable events documented.

## 2021-11-04 NOTE — Progress Notes (Signed)
Dr. Vicente Males was updated that Mrs. Schremp's hemoglobin is 9.4. Will continue to monitor for an additional hour.

## 2021-11-04 NOTE — Op Note (Signed)
[facility] Gastroenterology Patient Name: Pamela Hendrix Procedure Date: 11/04/2021 10:36 AM MRN: [id number] Account #: [id number] Date of Birth: June 02, 1945 Admit Type: Outpatient Age: 76 Room: [facility] ENDO ROOM 3 Gender: Female Note Status: Finalized Instrument Name: Jasper Riling [id number] Procedure:             Colonoscopy Indications:           Rectal bleeding Providers:             Jonathon Bellows MD, MD Referring MD:          Delsa Grana (Referring MD) Medicines:             Monitored Anesthesia Care Complications:         No immediate complications. Procedure:             Pre-Anesthesia Assessment:                        - Prior to the procedure, a History and Physical was                         performed, and patient medications, allergies and                         sensitivities were reviewed. The patient's tolerance                         of previous anesthesia was reviewed.                        - The risks and benefits of the procedure and the                         sedation options and risks were discussed with the                         patient. All questions were answered and informed                         consent was obtained.                        - After reviewing the risks and benefits, the patient                         was deemed in satisfactory condition to undergo the                         procedure.                        - ASA Grade Assessment: II - A patient with mild                         systemic disease.                        After obtaining informed consent, the colonoscope was                         passed under direct vision. Throughout the procedure,  the patient's blood pressure, pulse, and oxygen                         saturations were monitored continuously. The                         Colonoscope was introduced through the anus and                         advanced to the the ascending colon to  examine a                         stenosis. This was the intended extent. The                         colonoscopy was performed without difficulty. The                         patient tolerated the procedure well. The quality of                         the bowel preparation was good. Findings:      A benign-appearing, intrinsic severe stenosis measuring 1 cm (in length)       x 1.2 cm (inner diameter) was found in the sigmoid colon and was       traversed. The scope was withdrawn and replaced with the adult endoscope       in order to accomplish the maneuver.      Normal mucosa was found in the descending colon, in the transverse colon       and in the ascending colon.      Multiple small and large-mouthed diverticula were found in the sigmoid       colon.      Multiple small and large-mouthed diverticula were found in the sigmoid       colon. There was active bleeding coming from the diverticular opening.       Area was successfully injected with 3 mL of a 1:10,000 solution of       epinephrine for hemostasis. For hemostasis, two hemostatic clips were       successfully placed. There was no bleeding at the end of the procedure.      Non-bleeding internal hemorrhoids were found during retroflexion. The       hemorrhoids were large and Grade I (internal hemorrhoids that do not       prolapse).      The perianal exam findings include thrombosed external hemorrhoids. Impression:            - Stricture in the sigmoid colon.                        - Normal mucosa in the descending colon, in the                         transverse colon and in the ascending colon.                        - Diverticulosis in the sigmoid colon.                        -  Severe diverticulosis in the sigmoid colon. There                         was active bleeding coming from the diverticular                         opening. Injected. Clips were placed.                        - Non-bleeding internal hemorrhoids.                         - Thrombosed external hemorrhoids found on perianal                         exam.                        - No specimens collected. Recommendation:        - Observe patient in GI recovery unit for 2 hours for                         observation.                        - NPO for 2 hours.                        - Check cbc                        If hb stable and no bleeding then home, no nsaid's Procedure Code(s):     --- Professional ---                        [phone], 52, Colonoscopy, flexible; with control of                         bleeding, any method Diagnosis Code(s):     --- Professional ---                        E26.834, Other intestinal obstruction unspecified as                         to partial versus complete obstruction                        K64.0, First degree hemorrhoids                        K64.5, Perianal venous thrombosis                        K57.31, Diverticulosis of large intestine without                         perforation or abscess with bleeding                        K62.5, Hemorrhage of anus and rectum CPT copyright 2019 American Medical Association. All rights reserved. The codes documented in this report are preliminary and  upon coder review may  be revised to meet current compliance requirements. Jonathon Bellows, MD Jonathon Bellows MD, MD 11/04/2021 11:16:19 AM This report has been signed electronically. Number of Addenda: 0 Note Initiated On: 11/04/2021 10:36 AM Scope Withdrawal Time: 0 hours 12 minutes 59 seconds  Total Procedure Duration: 0 hours 24 minutes 3 seconds  Estimated Blood Loss:  Estimated blood loss was minimal.      [facility]

## 2021-11-04 NOTE — Progress Notes (Signed)
Dr. Vicente Males updated of patient's current condition. Vital signs are stable and within normal limits. Her lower abdominal pain is better and she now describes it as only discomfort. There has been no further bleeding. Mrs. Pamela Hendrix can be discharged home per Dr. Vicente Males and should come to the emergency room if she experiences bleeding and increased abdominal pain.

## 2021-11-04 NOTE — Anesthesia Postprocedure Evaluation (Signed)
Anesthesia Post Note  Patient: Pamela Hendrix  Procedure(s) Performed: COLONOSCOPY WITH PROPOFOL  Patient location during evaluation: PACU Anesthesia Type: General Level of consciousness: awake and alert Pain management: pain level controlled Vital Signs Assessment: post-procedure vital signs reviewed and stable Respiratory status: spontaneous breathing, nonlabored ventilation, respiratory function stable and patient connected to nasal cannula oxygen Cardiovascular status: blood pressure returned to baseline and stable Postop Assessment: no apparent nausea or vomiting Anesthetic complications: no   No notable events documented.   Last Vitals:  Vitals:   11/04/21 0956 11/04/21 1110  BP: (!) 146/74 123/66  Pulse: 77 84  Resp: 17 15  Temp: (!) 35.9 C (!) 35.9 C  SpO2: 100% 99%    Last Pain:  Vitals:   11/04/21 1110  TempSrc: Temporal  PainSc: [name]

## 2021-11-05 ENCOUNTER — Encounter: Payer: Self-pay | Admitting: Gastroenterology

## 2021-11-09 ENCOUNTER — Ambulatory Visit: Payer: Medicare HMO | Admitting: Gastroenterology

## 2021-11-09 ENCOUNTER — Other Ambulatory Visit: Payer: Self-pay

## 2021-11-09 ENCOUNTER — Encounter: Payer: Self-pay | Admitting: Gastroenterology

## 2021-11-09 VITALS — BP 133/77 | HR 75 | Temp 99.0°F | Wt 149.0 lb

## 2021-11-09 DIAGNOSIS — Z8719 Personal history of other diseases of the digestive system: Secondary | ICD-10-CM | POA: Diagnosis not present

## 2021-11-11 ENCOUNTER — Telehealth: Payer: Self-pay

## 2021-11-11 NOTE — Telephone Encounter (Signed)
Called patient to ask how she was doing and she stated that she was doing better. She stated that she only noticed dark blood but not bright red blood. I told her that I would be asking Dr. Vicente Males if she needed to schedule a flexible sigmoidoscopy or not. Dr. Vicente Males stated that at this time we can wait but for the patient to give [place] a call back in a week to let us know if she starts having red bright blood again. Patient was informed and she understood and had no further questions.

## 2021-11-22 DIAGNOSIS — D631 Anemia in chronic kidney disease: Secondary | ICD-10-CM | POA: Diagnosis not present

## 2021-11-22 DIAGNOSIS — N2581 Secondary hyperparathyroidism of renal origin: Secondary | ICD-10-CM | POA: Diagnosis not present

## 2021-11-22 DIAGNOSIS — I10 Essential (primary) hypertension: Secondary | ICD-10-CM | POA: Diagnosis not present

## 2021-11-22 DIAGNOSIS — R809 Proteinuria, unspecified: Secondary | ICD-10-CM | POA: Diagnosis not present

## 2021-11-22 DIAGNOSIS — N1831 Chronic kidney disease, stage 3a: Secondary | ICD-10-CM | POA: Diagnosis not present

## 2021-11-25 ENCOUNTER — Encounter: Payer: Self-pay | Admitting: Gastroenterology

## 2021-11-25 NOTE — Telephone Encounter (Signed)
1. Fiber 1-2 tbsp is sufficient , no ned for colace  2. If still has difficulty despite the fiber start miralax 1 cap daily

## 2021-11-26 ENCOUNTER — Other Ambulatory Visit: Payer: Self-pay

## 2021-11-26 DIAGNOSIS — K625 Hemorrhage of anus and rectum: Secondary | ICD-10-CM

## 2021-11-26 NOTE — Telephone Encounter (Signed)
1. Are you seeing any blood in the stooks .  2. 8.7 is a touch lower we should repeat cbc next week  3. If you see blood in stools contact office or go to ER

## 2021-11-29 DIAGNOSIS — K625 Hemorrhage of anus and rectum: Secondary | ICD-10-CM | POA: Diagnosis not present

## 2021-11-30 LAB — CBC WITH DIFFERENTIAL/PLATELET
Basophils Absolute: 0 10*3/uL (ref 0.0–0.2)
Basos: 1 %
EOS (ABSOLUTE): 0.1 10*3/uL (ref 0.0–0.4)
Eos: 1 %
Hematocrit: 28.4 % — ABNORMAL LOW (ref 34.0–46.6)
Hemoglobin: 9.1 g/dL — ABNORMAL LOW (ref 11.1–15.9)
Immature Grans (Abs): 0 10*3/uL (ref 0.0–0.1)
Immature Granulocytes: 0 %
Lymphocytes Absolute: 1.3 10*3/uL (ref 0.7–3.1)
Lymphs: 19 %
MCH: 31.1 pg (ref 26.6–33.0)
MCHC: 32 g/dL (ref 31.5–35.7)
MCV: 97 fL (ref 79–97)
Monocytes Absolute: 0.5 10*3/uL (ref 0.1–0.9)
Monocytes: 6 %
Neutrophils Absolute: 5.3 10*3/uL (ref 1.4–7.0)
Neutrophils: 73 %
Platelets: 128 10*3/uL — ABNORMAL LOW (ref 150–450)
RBC: 2.93 x10E6/uL — ABNORMAL LOW (ref 3.77–5.28)
RDW: 12.9 % (ref 11.7–15.4)
WBC: 7.2 10*3/uL (ref 3.4–10.8)

## 2021-12-03 NOTE — Telephone Encounter (Signed)
Can you inform her hb is same as what it was barely any difference - if has any blood in stool to let us know

## 2021-12-07 DIAGNOSIS — D2262 Melanocytic nevi of left upper limb, including shoulder: Secondary | ICD-10-CM | POA: Diagnosis not present

## 2021-12-07 DIAGNOSIS — D225 Melanocytic nevi of trunk: Secondary | ICD-10-CM | POA: Diagnosis not present

## 2021-12-07 DIAGNOSIS — L304 Erythema intertrigo: Secondary | ICD-10-CM | POA: Diagnosis not present

## 2021-12-07 DIAGNOSIS — L821 Other seborrheic keratosis: Secondary | ICD-10-CM | POA: Diagnosis not present

## 2021-12-07 DIAGNOSIS — D2271 Melanocytic nevi of right lower limb, including hip: Secondary | ICD-10-CM | POA: Diagnosis not present

## 2021-12-07 DIAGNOSIS — L858 Other specified epidermal thickening: Secondary | ICD-10-CM | POA: Diagnosis not present

## 2021-12-07 DIAGNOSIS — D2272 Melanocytic nevi of left lower limb, including hip: Secondary | ICD-10-CM | POA: Diagnosis not present

## 2021-12-07 DIAGNOSIS — D2261 Melanocytic nevi of right upper limb, including shoulder: Secondary | ICD-10-CM | POA: Diagnosis not present

## 2021-12-20 ENCOUNTER — Encounter: Payer: Self-pay | Admitting: Oncology

## 2021-12-25 NOTE — Progress Notes (Unsigned)
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  INTERVAL HISTORY: Patient is a 76 year old female with stage IIIa chronic renal insufficiency and persistent anemia.  She is referred for further evaluation and consideration of Retacrit.  She currently feels well and is asymptomatic.  She has no neurologic complaints.  She denies any recent fevers or illnesses.  She does report loss.  She has no chest pain, shortness of breath, cough, or hemoptysis.  She denies any nausea, vomiting, constipation, or diarrhea.  She has no melena or hematochezia.  She has no urinary complaints.  Patient feels at her baseline and offers no specific complaints today.  REVIEW OF SYSTEMS:   Review of Systems  Constitutional: Negative.  Negative for fever, malaise/fatigue and weight loss.  Respiratory: Negative.  Negative for cough, hemoptysis and shortness of breath.   Cardiovascular: Negative.  Negative for chest pain and leg swelling.  Gastrointestinal: Negative.  Negative for abdominal pain, blood in stool and melena.  Genitourinary: Negative.  Negative for hematuria.  Musculoskeletal: Negative.  Negative for back pain.  Skin: Negative.  Negative for rash.  Neurological: Negative.  Negative for dizziness, focal weakness, weakness and headaches.  Psychiatric/Behavioral:  The patient is not nervous/anxious.     As per HPI. Otherwise, a complete review of systems is negative.  PAST MEDICAL HISTORY: Past Medical History:  Diagnosis Date   Age-related osteoporosis without current pathological fracture  09/09/2021   Allergy    Anemia 2000   estimate   Anemia    Blood transfusion without reported diagnosis 04/07/2017   estimate   Bradycardia    Bursitis    CKD (chronic kidney disease) stage 3, GFR 30-59 ml/min (HCC)    Diverticulosis    GERD (gastroesophageal reflux disease)    Hiatal hernia    Hypertension    Hypothyroidism    Migraine with aura    states she doesn't have a headache, just the aura   Mixed hyperlipidemia    Osteoarthritis of knee    Thyroid disease     PAST SURGICAL HISTORY: Past Surgical History:  Procedure Laterality Date   CHOLECYSTECTOMY     COLONOSCOPY  06/02/2014   cleared for 10 yrs- Dr Rayann Heman   COLONOSCOPY N/A 04/07/2017   Procedure: COLONOSCOPY;  Surgeon: Lin Landsman, MD;  Location: [facility];  Service: Gastroenterology;  Laterality: N/A;   COLONOSCOPY WITH PROPOFOL N/A 11/04/2021   Procedure: COLONOSCOPY WITH PROPOFOL;  Surgeon: Jonathon Bellows, MD;  Location: [facility] ENDOSCOPY;  Service: Gastroenterology;  Laterality: N/A;   ESOPHAGEAL DILATION     ESOPHAGEAL DILATION  10/21/2021   Procedure: ESOPHAGEAL DILATION;  Surgeon: Lucilla Lame, MD;  Location: [facility];  Service: Endoscopy;;  12-18 mm   ESOPHAGOGASTRODUODENOSCOPY N/A 04/07/2017   Procedure: ESOPHAGOGASTRODUODENOSCOPY (EGD);  Surgeon: Lin Landsman, MD;  Location: [facility] ENDOSCOPY;  Service: Gastroenterology;  Laterality: N/A;   ESOPHAGOGASTRODUODENOSCOPY N/A 10/21/2021   Procedure: ESOPHAGOGASTRODUODENOSCOPY (EGD);  Surgeon: Lucilla Lame, MD;  Location: [facility];  Service: Endoscopy;  Laterality: N/A;   HERNIA REPAIR  08/28/2014   estimate   TONSILLECTOMY     TUBAL LIGATION  1975   estimate   UPPER  GI ENDOSCOPY  06/02/2014    FAMILY HISTORY: Family History  Problem Relation Age of Onset   Kidney disease Mother    Cancer Mother        brain   Arthritis Mother    Obesity Mother    Varicose Veins Mother    Heart disease Father    Heart attack Father     Hearing loss Father    Hypertension Father    Lupus Sister    Diabetes Sister    Thyroid disease Sister    Rheum arthritis Sister    Arthritis Sister    Obesity Sister    Diabetes Sister    Arthritis Sister    Cancer Sister    Hearing loss Sister    Intellectual disability Sister    Learning disabilities Sister    Multiple myeloma Sister    Thyroid disease Sister    Rheum arthritis Sister    Arthritis Sister    Cancer Sister    Obesity Sister    Breast cancer Maternal Aunt    Miscarriages / [place] Daughter     ADVANCED DIRECTIVES (Y/N):  N  HEALTH MAINTENANCE: Social History   Tobacco Use   Smoking status: Never   Smokeless tobacco: Never   Tobacco comments:    Never, but father and husband smoked  Vaping Use   Vaping Use: Never used  Substance Use Topics   Alcohol use: Not Currently    Alcohol/week: 0.0 standard drinks of alcohol    Comment: on special occasions like New Years Eve   Drug use: Never     Colonoscopy:  PAP:  Bone density:  Lipid panel:  Allergies  Allergen Reactions   Lisinopril     cough   Codeine Nausea Only and Nausea And Vomiting    Current Outpatient Medications  Medication Sig Dispense Refill   alendronate (FOSAMAX) 70 MG tablet TAKE 1 TABLET BY MOUTH EVERY 7 DAYS TAKE WITH A FULL GLASS OF WATER ON AN EMPTY STOMACH 12 tablet 0   aspirin EC 81 MG tablet Take 81 mg by mouth every other day.     cetirizine (ZYRTEC) 10 MG tablet Take 10 mg by mouth daily. otc     Cholecalciferol (VITAMIN D-3) 125 MCG (5000 UT) TABS Take 1 capsule by mouth daily.     levothyroxine (EUTHYROX) 88 MCG tablet Take 1 tablet (88 mcg total) by mouth daily. First thing in the morning on empty stomach 90 tablet 3   losartan (COZAAR) 100 MG tablet Take 100 mg by mouth daily.     lovastatin (MEVACOR) 20 MG tablet Take 1 tablet (20 mg total) by mouth at bedtime. 90 tablet 3   montelukast (SINGULAIR) 10 MG tablet Take 1 tablet (10 mg total) by mouth daily. 90  tablet 3   fluticasone (FLONASE) 50 MCG/ACT nasal spray Place 2 sprays into both nostrils daily. PRN (Patient not taking: Reported on 12/30/2021) 16 g 2   No current facility-administered medications for this visit.    OBJECTIVE: Vitals:   12/30/21 1334  BP: (!) 152/60  Pulse: 65  Resp: 16  Temp: (!) 97.5 F (36.4 C)  SpO2: 100%     Body mass index is 28.31 kg/m.    ECOG FS:0 - Asymptomatic  General: Well-developed, well-nourished, no acute distress. Eyes: Pink conjunctiva, anicteric sclera. HEENT: Normocephalic, moist mucous membranes. Lungs: No audible wheezing or coughing. Heart: Regular rate and rhythm. Abdomen: Soft, nontender, no obvious distention. Musculoskeletal: No edema, cyanosis, or clubbing. Neuro:  Alert, answering all questions appropriately. Cranial nerves grossly intact. Skin: No rashes or petechiae noted. Psych: Normal affect. Lymphatics: No cervical, calvicular, axillary or inguinal LAD.   LAB RESULTS:  Lab Results  Component Value Date   NA 141 09/13/2021   K 3.9 09/13/2021   CL 108 09/13/2021   CO2 26 09/13/2021   GLUCOSE 100 (H) 09/13/2021   BUN 14 09/13/2021   CREATININE 1.24 (H) 09/13/2021   CALCIUM 8.9 09/13/2021   PROT 7.0 09/13/2021   ALBUMIN 3.5 04/06/2017   AST 14 09/13/2021   ALT 8 09/13/2021   ALKPHOS 58 04/06/2017   BILITOT 0.9 09/13/2021   GFRNONAA 43 (L) 08/20/2020   GFRAA 50 (L) 08/20/2020    Lab Results  Component Value Date   WBC 9.0 12/30/2021   NEUTROABS 5.3 11/29/2021   HGB 9.7 (L) 12/30/2021   HCT 30.2 (L) 12/30/2021   MCV 97.4 12/30/2021   PLT 168 12/30/2021     STUDIES: No results found.  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  Her reticulocyte count is inappropriately normal.  Iron stores, B12, folate levels are pending at time of dictation.  She will benefit from 40,000 units Retacrit if her hemoglobin remains below 10.0.   Return to clinic next week for treatment.  Patient will then return to clinic monthly for laboratory work and consideration of Retacrit.  She would then return to clinic in 4 months for repeat laboratory work, further evaluation, and continuation of treatment. Chronic renal insufficiency: Stage IIIa.  Continue follow-up with nephrology as indicated.  I spent a total of 45 minutes reviewing chart data, face-to-face evaluation with the patient, counseling and coordination of care as detailed above.   Patient expressed understanding and was in agreement with this plan. She also understands that She can call clinic at any time with any questions, concerns, or complaints.    Lloyd Huger, MD   12/30/2021 4:06 PM

## 2021-12-30 ENCOUNTER — Inpatient Hospital Stay: Payer: Medicare HMO | Attending: Oncology | Admitting: Oncology

## 2021-12-30 ENCOUNTER — Encounter: Payer: Self-pay | Admitting: Oncology

## 2021-12-30 ENCOUNTER — Inpatient Hospital Stay: Payer: Medicare HMO

## 2021-12-30 VITALS — BP 152/60 | HR 65 | Temp 97.5°F | Resp 16 | Ht 62.0 in | Wt 154.8 lb

## 2021-12-30 DIAGNOSIS — N1831 Chronic kidney disease, stage 3a: Secondary | ICD-10-CM | POA: Diagnosis not present

## 2021-12-30 DIAGNOSIS — D631 Anemia in chronic kidney disease: Secondary | ICD-10-CM | POA: Insufficient documentation

## 2021-12-30 DIAGNOSIS — Z803 Family history of malignant neoplasm of breast: Secondary | ICD-10-CM | POA: Insufficient documentation

## 2021-12-30 DIAGNOSIS — I129 Hypertensive chronic kidney disease with stage 1 through stage 4 chronic kidney disease, or unspecified chronic kidney disease: Secondary | ICD-10-CM | POA: Diagnosis not present

## 2021-12-30 DIAGNOSIS — Z808 Family history of malignant neoplasm of other organs or systems: Secondary | ICD-10-CM | POA: Insufficient documentation

## 2021-12-30 LAB — RETICULOCYTES
Immature Retic Fract: 14.8 % (ref 2.3–15.9)
RBC.: 3.09 MIL/uL — ABNORMAL LOW (ref 3.87–5.11)
Retic Count, Absolute: 51.6 10*3/uL (ref 19.0–186.0)
Retic Ct Pct: 1.7 % (ref 0.4–3.1)

## 2021-12-30 LAB — IRON AND TIBC
Iron: 63 ug/dL (ref 28–170)
Saturation Ratios: 14 % (ref 10.4–31.8)
TIBC: 456 ug/dL — ABNORMAL HIGH (ref 250–450)
UIBC: 393 ug/dL

## 2021-12-30 LAB — CBC
HCT: 30.2 % — ABNORMAL LOW (ref 36.0–46.0)
Hemoglobin: 9.7 g/dL — ABNORMAL LOW (ref 12.0–15.0)
MCH: 31.3 pg (ref 26.0–34.0)
MCHC: 32.1 g/dL (ref 30.0–36.0)
MCV: 97.4 fL (ref 80.0–100.0)
Platelets: 168 10*3/uL (ref 150–400)
RBC: 3.1 MIL/uL — ABNORMAL LOW (ref 3.87–5.11)
RDW: 13.8 % (ref 11.5–15.5)
WBC: 9 10*3/uL (ref 4.0–10.5)
nRBC: 0.2 % (ref 0.0–0.2)

## 2021-12-30 LAB — LACTATE DEHYDROGENASE: LDH: 149 U/L (ref 98–192)

## 2021-12-30 LAB — DAT, POLYSPECIFIC AHG (ARMC ONLY): Polyspecific AHG test: NEGATIVE

## 2021-12-30 LAB — FERRITIN: Ferritin: 10 ng/mL — ABNORMAL LOW (ref 11–307)

## 2021-12-30 LAB — VITAMIN B12: Vitamin B-12: 277 pg/mL (ref 180–914)

## 2021-12-30 LAB — FOLATE: Folate: 9.6 ng/mL (ref >5.9)

## 2021-12-31 ENCOUNTER — Telehealth: Payer: Self-pay | Admitting: Oncology

## 2021-12-31 LAB — HAPTOGLOBIN: Haptoglobin: 149 mg/dL (ref 42–346)

## 2021-12-31 LAB — ERYTHROPOIETIN: Erythropoietin: 23.3 m[IU]/mL — ABNORMAL HIGH (ref 2.6–18.5)

## 2021-12-31 NOTE — Telephone Encounter (Signed)
I called patient to inform her of her appointments. She states that she looked up the injection and that her insurance will not cover it, she would like for someone to give her a call back to discuss that with her.

## 2022-01-03 ENCOUNTER — Encounter: Payer: Self-pay | Admitting: Oncology

## 2022-01-03 NOTE — Telephone Encounter (Signed)
Per Drucie Opitz, procrit is preferred by insurance instead of the retacrit

## 2022-01-06 ENCOUNTER — Inpatient Hospital Stay: Payer: Medicare HMO

## 2022-01-06 VITALS — BP 121/71 | HR 64

## 2022-01-06 DIAGNOSIS — Z808 Family history of malignant neoplasm of other organs or systems: Secondary | ICD-10-CM | POA: Diagnosis not present

## 2022-01-06 DIAGNOSIS — I129 Hypertensive chronic kidney disease with stage 1 through stage 4 chronic kidney disease, or unspecified chronic kidney disease: Secondary | ICD-10-CM | POA: Diagnosis not present

## 2022-01-06 DIAGNOSIS — Z803 Family history of malignant neoplasm of breast: Secondary | ICD-10-CM | POA: Diagnosis not present

## 2022-01-06 DIAGNOSIS — N1831 Chronic kidney disease, stage 3a: Secondary | ICD-10-CM | POA: Diagnosis not present

## 2022-01-06 DIAGNOSIS — D631 Anemia in chronic kidney disease: Secondary | ICD-10-CM

## 2022-01-06 MED ORDER — EPOETIN ALFA 40000 UNIT/ML IJ SOLN
40000.0000 [IU] | Freq: Once | INTRAMUSCULAR | Status: AC
  Administered 2022-01-06: 40000 [IU] via SUBCUTANEOUS
  Filled 2022-01-06: qty 1

## 2022-01-07 ENCOUNTER — Encounter: Payer: Self-pay | Admitting: Oncology

## 2022-01-10 ENCOUNTER — Ambulatory Visit: Payer: Medicare HMO

## 2022-01-24 ENCOUNTER — Encounter: Payer: Self-pay | Admitting: Family Medicine

## 2022-01-26 DIAGNOSIS — I10 Essential (primary) hypertension: Secondary | ICD-10-CM | POA: Diagnosis not present

## 2022-01-26 DIAGNOSIS — Q2112 Patent foramen ovale: Secondary | ICD-10-CM | POA: Diagnosis not present

## 2022-01-26 DIAGNOSIS — R001 Bradycardia, unspecified: Secondary | ICD-10-CM | POA: Diagnosis not present

## 2022-01-26 DIAGNOSIS — N1831 Chronic kidney disease, stage 3a: Secondary | ICD-10-CM | POA: Diagnosis not present

## 2022-01-26 DIAGNOSIS — E782 Mixed hyperlipidemia: Secondary | ICD-10-CM | POA: Diagnosis not present

## 2022-01-31 ENCOUNTER — Other Ambulatory Visit: Payer: Self-pay | Admitting: Family Medicine

## 2022-01-31 NOTE — Telephone Encounter (Signed)
Medication Refill - Medication: alendronate (FOSAMAX) 70 MG tablet  Has the patient contacted their pharmacy? No.  Preferred Pharmacy (with phone number or street name): [facility] [place], [place] to [facility] (Ph: [phone])  Has the patient been seen for an appointment in the last year OR does the patient have an upcoming appointment? Yes.    Agent: Please be advised that RX refills may take up to 3 business days. We ask that you follow-up with your pharmacy.

## 2022-02-01 MED ORDER — ALENDRONATE SODIUM 70 MG PO TABS: ORAL_TABLET | ORAL | 0 refills | Status: DC

## 2022-02-01 NOTE — Telephone Encounter (Signed)
Requested medication (s) are due for refill today: yes  Requested medication (s) are on the active medication list: yes  Last refill:  10/19/21 #12 0 refills  Future visit scheduled: yes in 2 months  Notes to clinic:  protocol failed labs. Do you want to refill Rx?     Requested Prescriptions  Pending Prescriptions Disp Refills   alendronate (FOSAMAX) 70 MG tablet 12 tablet 0    Sig: TAKE 1 TABLET BY MOUTH EVERY 7 DAYS TAKE WITH A FULL GLASS OF WATER ON AN EMPTY STOMACH     Endocrinology:  Bisphosphonates Failed - 01/31/2022  3:47 PM      Failed - Vitamin D in normal range and within 360 days    Vit D, 25-Hydroxy  Date Value Ref Range Status  05/06/2019 33 30 - 100 ng/mL Final    Comment:    Vitamin D Status         25-OH Vitamin D: . Deficiency:                    <20 ng/mL Insufficiency:             20 - 29 ng/mL Optimal:                 > or = 30 ng/mL . For 25-OH Vitamin D testing on patients on  D2-supplementation and patients for whom quantitation  of D2 and D3 fractions is required, the [facility](TM) 25-OH VIT D, (D2,D3), LC/MS/MS is recommended: order  code 41781 (patients >69yrs). See Note 1 . Note 1 . For additional information, please refer to  http://education.[facility].com/faq/FAQ199  (This link is being provided for informational/ educational purposes only.)          Failed - Cr in normal range and within 360 days    Creat  Date Value Ref Range Status  09/13/2021 1.24 (H) 0.60 - 1.00 mg/dL Final         Failed - Mg Level in normal range and within 360 days    Magnesium  Date Value Ref Range Status  09/03/2019 2.2 1.5 - 2.5 mg/dL Final         Failed - Phosphate in normal range and within 360 days    Phosphorus  Date Value Ref Range Status  09/07/2016 3.7 2.5 - 4.5 mg/dL Final         Passed - Ca in normal range and within 360 days    Calcium  Date Value Ref Range Status  09/13/2021 8.9 8.6 - 10.4 mg/dL Final   Calcium, Total   Date Value Ref Range Status  12/05/2013 9.1 8.5 - 10.1 mg/dL Final         Passed - eGFR is 30 or above and within 360 days    GFR, Est African American  Date Value Ref Range Status  08/20/2020 50 (L) > OR = 60 mL/min/1.104m2 Final   GFR, Est Non African American  Date Value Ref Range Status  08/20/2020 43 (L) > OR = 60 mL/min/1.104m2 Final   eGFR  Date Value Ref Range Status  09/13/2021 45 (L) > OR = 60 mL/min/1.64m2 Final    Comment:    The eGFR is based on the CKD-EPI 2021 equation. To calculate  the new eGFR from a previous Creatinine or Cystatin C result, go to https://www.kidney.org/professionals/ kdoqi/gfr%5Fcalculator          Passed - Valid encounter within last 12 months    Recent Outpatient Visits  4 months ago Annual physical exam   [facility] Bo Merino, FNP   4 months ago Primary hypertension   [facility] Mecum, Dani Gobble, PA-C   10 months ago Low back pain radiating to both legs   [place], FNP   10 months ago Primary hypertension   [place], [place], DO   1 year ago Stage 3a chronic kidney disease [facility])   [facility] Delsa Grana, PA-C       Future Appointments             In 2 months [place] Density or Dexa Scan completed in the last 2 years

## 2022-02-03 ENCOUNTER — Inpatient Hospital Stay: Payer: Medicare HMO | Attending: Oncology

## 2022-02-03 ENCOUNTER — Inpatient Hospital Stay: Payer: Medicare HMO

## 2022-02-03 VITALS — BP 121/70 | HR 60

## 2022-02-03 DIAGNOSIS — N189 Chronic kidney disease, unspecified: Secondary | ICD-10-CM

## 2022-02-03 DIAGNOSIS — D631 Anemia in chronic kidney disease: Secondary | ICD-10-CM | POA: Diagnosis not present

## 2022-02-03 DIAGNOSIS — N1831 Chronic kidney disease, stage 3a: Secondary | ICD-10-CM | POA: Diagnosis not present

## 2022-02-03 LAB — HEMOGLOBIN AND HEMATOCRIT, BLOOD
HCT: 31.2 % — ABNORMAL LOW (ref 36.0–46.0)
Hemoglobin: 9.9 g/dL — ABNORMAL LOW (ref 12.0–15.0)

## 2022-02-03 MED ORDER — EPOETIN ALFA 40000 UNIT/ML IJ SOLN
40000.0000 [IU] | Freq: Once | INTRAMUSCULAR | Status: AC
  Administered 2022-02-03: 40000 [IU] via SUBCUTANEOUS
  Filled 2022-02-03: qty 1

## 2022-03-03 ENCOUNTER — Inpatient Hospital Stay: Payer: Medicare HMO | Attending: Oncology

## 2022-03-03 ENCOUNTER — Inpatient Hospital Stay: Payer: Medicare HMO

## 2022-03-03 DIAGNOSIS — N183 Chronic kidney disease, stage 3 unspecified: Secondary | ICD-10-CM | POA: Insufficient documentation

## 2022-03-03 DIAGNOSIS — D631 Anemia in chronic kidney disease: Secondary | ICD-10-CM | POA: Insufficient documentation

## 2022-03-03 DIAGNOSIS — N1831 Chronic kidney disease, stage 3a: Secondary | ICD-10-CM | POA: Diagnosis not present

## 2022-03-03 LAB — HEMOGLOBIN AND HEMATOCRIT, BLOOD
HCT: 32.2 % — ABNORMAL LOW (ref 36.0–46.0)
Hemoglobin: 10 g/dL — ABNORMAL LOW (ref 12.0–15.0)

## 2022-03-18 ENCOUNTER — Other Ambulatory Visit: Payer: Self-pay | Admitting: Family Medicine

## 2022-03-18 DIAGNOSIS — Z1231 Encounter for screening mammogram for malignant neoplasm of breast: Secondary | ICD-10-CM

## 2022-03-22 DIAGNOSIS — H2513 Age-related nuclear cataract, bilateral: Secondary | ICD-10-CM | POA: Diagnosis not present

## 2022-03-24 DIAGNOSIS — N1831 Chronic kidney disease, stage 3a: Secondary | ICD-10-CM | POA: Diagnosis not present

## 2022-03-28 DIAGNOSIS — I10 Essential (primary) hypertension: Secondary | ICD-10-CM | POA: Diagnosis not present

## 2022-03-28 DIAGNOSIS — D631 Anemia in chronic kidney disease: Secondary | ICD-10-CM | POA: Diagnosis not present

## 2022-03-28 DIAGNOSIS — N2581 Secondary hyperparathyroidism of renal origin: Secondary | ICD-10-CM | POA: Diagnosis not present

## 2022-03-28 DIAGNOSIS — N1832 Chronic kidney disease, stage 3b: Secondary | ICD-10-CM | POA: Diagnosis not present

## 2022-03-29 ENCOUNTER — Telehealth: Payer: Self-pay | Admitting: Oncology

## 2022-03-29 ENCOUNTER — Telehealth: Payer: Self-pay | Admitting: *Deleted

## 2022-03-29 NOTE — Telephone Encounter (Signed)
Patient states she went to her Kidney dr and they did labs and her numbers were good. She ask does she need to come here for labs and injection? She saw Dr. Holley Raring. Please advise. Thank you

## 2022-03-29 NOTE — Telephone Encounter (Signed)
Patient called reporting that she had labs drawn at Nephrology office Monday and her hgb is 10.3 so she does not ned her shot on Thursday. She is asking if she needs to come in for lab draw or not. Please advise  CBC and Differential Specimen: Blood - Blood, Venous  Ref Range & Units 5 d ago Comments  WBC 3.8 - 10.8 Thousand/uL 6.3   RBC 3.80 - 5.10 Million/uL 3.44 Low    Hemoglobin 11.7 - 15.5 g/dL 10.3 Low    Hematocrit 35.0 - 45.0 % 31.8 Low    MCV 80.0 - 100.0 fL 92.4   MCH 27.0 - 33.0 pg 29.9   MCHC 32.0 - 36.0 g/dL 32.4   RDW 11.0 - 15.0 % 15.5 High    Platelets 140 - 400 Thousand/uL 141   MPV 7.5 - 12.5 fL 13.1 High    Neutrophils Absolute 1500 - 7800 cells/uL 4,372   Band Neutrophils Absolute, Manual Count 0 - 750 cells/uL CANCELED Result canceled by the ancillary.  Metamyelocytes Absolute 0 cells/uL CANCELED Result canceled by the ancillary.  Absolute Myelocytes 0 cells/uL CANCELED Result canceled by the ancillary.  Absolute Promyelocytes 0 cells/uL CANCELED Result canceled by the ancillary.  Lymphocytes Absolute 850 - 3900 cells/uL 1,418   Monocytes Absolute 200 - 950 cells/uL 441   Eosinophils Absolute 15 - 500 cells/uL 38   Basophils Absolute 0 - 200 cells/uL 32   Blasts Absolute 0 cells/uL CANCELED Result canceled by the ancillary.  NRBC Absolute 0 cells/uL CANCELED Result canceled by the ancillary.  Neutrophils Relative % 69.4   Bands Absolute % CANCELED Result canceled by the ancillary.  Metamyelocytes Percent % CANCELED Result canceled by the ancillary.  Myelocytes Relative % CANCELED Result canceled by the ancillary.  Promyelocytes Relative % CANCELED Result canceled by the ancillary.  Lymphocytes % 22.5   Variant lymphocytes/100 WBC (Bld) 0 - 10 % CANCELED Result canceled by the ancillary.  Monocytes % 7.0   Eosinophils % 0.6   Basophils Relative % 0.5   Blasts % CANCELED Result canceled by the ancillary.  nRBC 0 /100 WBC CANCELED Result canceled by the  ancillary.  Comment(s)  CANCELED Result canceled by the ancillary.  Resulting Agency  [facility]   Specimen Collected: 03/24/22 09:59   Performed by: Yvetta Coder Last Resulted: 03/25/22 10:38  Received From: [facility]  Result Received: 03/29/22 09:00

## 2022-03-29 NOTE — Telephone Encounter (Signed)
Patient notified that she does not need to come for appointment this week and to keep her appointment for 05/05/22. She agreed and thanked me for letting her know. Appts cancelled for 03/31/22

## 2022-03-31 ENCOUNTER — Inpatient Hospital Stay: Payer: Medicare HMO

## 2022-04-19 ENCOUNTER — Ambulatory Visit (INDEPENDENT_AMBULATORY_CARE_PROVIDER_SITE_OTHER): Payer: Medicare HMO

## 2022-04-19 VITALS — BP 143/65 | HR 57 | Ht 62.0 in | Wt 152.2 lb

## 2022-04-19 DIAGNOSIS — M816 Localized osteoporosis [Lequesne]: Secondary | ICD-10-CM

## 2022-04-19 DIAGNOSIS — Z0000 Encounter for general adult medical examination without abnormal findings: Secondary | ICD-10-CM

## 2022-04-19 DIAGNOSIS — M810 Age-related osteoporosis without current pathological fracture: Secondary | ICD-10-CM | POA: Diagnosis not present

## 2022-04-19 NOTE — Progress Notes (Signed)
Subjective:  I connected with  Pamela Hendrix on 04/19/22 by a video and audio enabled telemedicine application and verified that I am speaking with the correct person using two identifiers.  Patient Location: Home  Provider Location: Office/Clinic  I discussed the limitations of evaluation and management by telemedicine. The patient expressed understanding and agreed to proceed.  Pamela Hendrix is a 76 y.o. female who presents for Medicare Annual (Subsequent) preventive examination.  Review of Systems    Defer to PCP       Objective:    There were no vitals filed for this visit. There is no height or weight on file to calculate BMI.     12/30/2021    1:33 PM 11/04/2021    9:48 AM 10/21/2021    7:06 AM 04/15/2021   10:19 AM 02/20/2020    1:54 PM 02/19/2019    1:50 PM 09/04/2017    1:47 PM  Advanced Directives  Does Patient Have a Medical Advance Directive? _0  Yes Yes  Type of Paramedic of [place];Living will  [place];Living will [place];Living will [place];Living will [place];Living will [place];Living will  Does patient want to make changes to medical advance directive?   No - Patient declined      Copy of [place] in Chart?   No - copy requested Yes - validated most recent copy scanned in chart (See row information) No - copy requested No - copy requested No - copy requested    Current Medications (verified) Outpatient Encounter Medications as of 04/19/2022  Medication Sig   alendronate (FOSAMAX) 70 MG tablet TAKE 1 TABLET BY MOUTH EVERY 7 DAYS TAKE WITH A FULL GLASS OF WATER ON AN EMPTY STOMACH   aspirin EC 81 MG tablet Take 81 mg by mouth every other day.   cetirizine (ZYRTEC) 10 MG tablet Take 10 mg by mouth daily. otc   Cholecalciferol (VITAMIN D-3) 125 MCG (5000 UT) TABS Take 1 capsule by  mouth daily.   fluticasone (FLONASE) 50 MCG/ACT nasal spray Place 2 sprays into both nostrils daily. PRN (Patient not taking: Reported on 12/30/2021)   levothyroxine (EUTHYROX) 88 MCG tablet Take 1 tablet (88 mcg total) by mouth daily. First thing in the morning on empty stomach   losartan (COZAAR) 100 MG tablet Take 100 mg by mouth daily.   lovastatin (MEVACOR) 20 MG tablet Take 1 tablet (20 mg total) by mouth at bedtime.   montelukast (SINGULAIR) 10 MG tablet Take 1 tablet (10 mg total) by mouth daily.   No facility-administered encounter medications on file as of 04/19/2022.    Allergies (verified) Lisinopril and Codeine   History: Past Medical History:  Diagnosis Date   Age-related osteoporosis without current pathological fracture 09/09/2021   Allergy    Anemia 2000   estimate   Anemia    Blood transfusion without reported diagnosis 04/07/2017   estimate   Bradycardia    Bursitis    CKD (chronic kidney disease) stage 3, GFR 30-59 ml/min (HCC)    Diverticulosis    GERD (gastroesophageal reflux disease)    Hiatal hernia    Hypertension    Hypothyroidism    Migraine with aura    states she doesn't have a headache, just the aura   Mixed hyperlipidemia    Osteoarthritis of knee    Thyroid disease    Past Surgical History:  Procedure Laterality Date   CHOLECYSTECTOMY     COLONOSCOPY  06/02/2014   cleared for 10 yrs- Dr Rayann Heman   COLONOSCOPY N/A 04/07/2017   Procedure: COLONOSCOPY;  Surgeon: Lin Landsman, MD;  Location: [facility] ENDOSCOPY;  Service: Gastroenterology;  Laterality: N/A;   COLONOSCOPY WITH PROPOFOL N/A 11/04/2021   Procedure: COLONOSCOPY WITH PROPOFOL;  Surgeon: Jonathon Bellows, MD;  Location: [facility] ENDOSCOPY;  Service: Gastroenterology;  Laterality: N/A;   ESOPHAGEAL DILATION     ESOPHAGEAL DILATION  10/21/2021   Procedure: ESOPHAGEAL DILATION;  Surgeon: Lucilla Lame, MD;  Location: [place];  Service: Endoscopy;;  12-18 mm   ESOPHAGOGASTRODUODENOSCOPY  N/A 04/07/2017   Procedure: ESOPHAGOGASTRODUODENOSCOPY (EGD);  Surgeon: Lin Landsman, MD;  Location: [facility] ENDOSCOPY;  Service: Gastroenterology;  Laterality: N/A;   ESOPHAGOGASTRODUODENOSCOPY N/A 10/21/2021   Procedure: ESOPHAGOGASTRODUODENOSCOPY (EGD);  Surgeon: Lucilla Lame, MD;  Location: [place];  Service: Endoscopy;  Laterality: N/A;   HERNIA REPAIR  08/28/2014   estimate   TONSILLECTOMY     TUBAL LIGATION  1975   estimate   UPPER GI ENDOSCOPY  06/02/2014   Family History  Problem Relation Age of Onset   Kidney disease Mother    Cancer Mother        brain   Arthritis Mother    Obesity Mother    Varicose Veins Mother    Heart disease Father    Heart attack Father    Hearing loss Father    Hypertension Father    Lupus Sister    Diabetes Sister    Thyroid disease Sister    Rheum arthritis Sister    Arthritis Sister    Obesity Sister    Diabetes Sister    Arthritis Sister    Cancer Sister    Hearing loss Sister    Intellectual disability Sister    Learning disabilities Sister    Multiple myeloma Sister    Thyroid disease Sister    Rheum arthritis Sister    Arthritis Sister    Cancer Sister    Obesity Sister    Breast cancer Maternal Aunt    Miscarriages / [place] Daughter    Social History   Socioeconomic History   Marital status: Married    Spouse name: Doren Custard   Number of children: 1   Years of education: some college   Highest education level: 12th grade  Occupational History   Occupation: Retired  Tobacco Use   Smoking status: Never   Smokeless tobacco: Never   Tobacco comments:    Never, but father and husband smoked  Vaping Use   Vaping Use: Never used  Substance and Sexual Activity   Alcohol use: Not Currently    Alcohol/week: 0.0 standard drinks of alcohol    Comment: on special occasions like New Years Eve   Drug use: Never   Sexual activity: Not Currently    Birth control/protection: Post-menopausal, Surgical     Comment: tubes tied in my 30's, now post-menopausal  Other Topics Concern   Not on file  Social History Narrative   Not on file   Social Determinants of Health   Financial Resource Strain: Low Risk  (09/15/2021)   Overall Financial Resource Strain (CARDIA)    Difficulty of Paying Living Expenses: Not hard at all  Food Insecurity: No Food Insecurity (09/15/2021)   Hunger Vital Sign    Worried About Running Out of Food in the Last Year: Never true    [place] in the Last  Year: Never true  Transportation Needs: No Transportation Needs (09/15/2021)   PRAPARE - Hydrologist (Medical): No    Lack of Transportation (Non-Medical): No  Physical Activity: Insufficiently Active (09/15/2021)   Exercise Vital Sign    Days of Exercise per Week: 1 day    Minutes of Exercise per Session: 40 min  Stress: No Stress Concern Present (09/15/2021)   [place]    Feeling of Stress : Only a little  Social Connections: Moderately Integrated (09/15/2021)   Social Connection and Isolation Panel [NHANES]    Frequency of Communication with Friends and Family: Three times a week    Frequency of Social Gatherings with Friends and Family: Once a week    Attends Religious Services: 1 to 4 times per year    Active Member of [employer] or Organizations: No    Attends Archivist Meetings: Never    Marital Status: Married    Tobacco Counseling Counseling given: Not Answered Tobacco comments: Never, but father and husband smoked   Clinical Intake:                 Diabetic?No          Activities of Daily Living    10/21/2021    6:56 AM 09/15/2021    8:14 AM  In your present state of health, do you have any difficulty performing the following activities:  Hearing? 0 0  Vision? 0 0  Difficulty concentrating or making decisions? 0 1  Walking or climbing stairs? 0 0  Dressing or bathing? 0 0  Doing  errands, shopping?  0    Patient Care Team: Delsa Grana, PA-C as PCP - General (Family Medicine) Gabriel Carina Betsey Holiday, MD as Physician Assistant (Endocrinology) Corey Skains, MD as Consulting Physician (Cardiology) Lin Landsman, MD as Consulting Physician (Gastroenterology)  Indicate any recent Medical Services you may have received from other than [facility] providers in the past year (date may be approximate).     Assessment:   This is a routine wellness examination for [place].  Hearing/Vision screen No results found.  Dietary issues and exercise activities discussed:     Goals Addressed   None   Depression Screen    09/15/2021    8:18 AM 09/09/2021    1:05 PM 04/15/2021   10:13 AM 03/25/2021   10:58 AM 03/10/2021   10:55 AM 09/09/2020    1:41 PM 08/20/2020    1:30 PM  PHQ 2/9 Scores  PHQ - 2 Score _0 0 0 2  PHQ- 9 Score _1 0 0 2    Fall Risk    09/15/2021    8:14 AM 09/09/2021    1:05 PM 04/15/2021   10:19 AM 03/25/2021   10:57 AM 03/10/2021   10:47 AM  Fall Risk   Falls in the past year? 0 0 0 0 0  Number falls in past yr: 0 0 0 0 0  Injury with Fall? 0 0 0 0 0  Risk for fall due to :  No Fall Risks No Fall Risks  No Fall Risks  Follow up Falls evaluation completed Falls prevention discussed Falls prevention discussed Falls evaluation completed Falls prevention discussed    FALL RISK PREVENTION PERTAINING TO THE HOME:  Any stairs in or around the home? Yes  If so, are there any without handrails? No  Home free of loose throw rugs  in walkways, pet beds, electrical cords, etc? Yes Adequate lighting in your home to reduce risk of falls? Yes   ASSISTIVE DEVICES UTILIZED TO PREVENT FALLS:  Life alert? No  Use of a cane, walker or w/c? No  Grab bars in the bathroom? Yes  Shower chair or bench in shower? Yes  Elevated toilet seat or a handicapped toilet? Yes   TIMED UP AND GO:  Was the test performed? No .  Length of time to ambulate 10 feet: n/a  sec.     Cognitive Function:        02/20/2020    1:56 PM 02/19/2019    1:54 PM 09/04/2017    1:55 PM  6CIT Screen  What Year? 0 points 0 points 0 points  What month? 0 points 0 points 0 points  What time? 0 points 0 points 0 points  Count back from 20 0 points 0 points 0 points  Months in reverse 0 points 0 points 0 points  Repeat phrase 2 points 0 points 0 points  Total Score 2 points 0 points 0 points    Immunizations Immunization History  Administered Date(s) Administered   Fluad Quad(high Dose 65+) 02/16/2019, 02/20/2020   H1N1 03/01/2017   Influenza, High Dose Seasonal PF 03/01/2017, 01/22/2018   Influenza,inj,Quad PF,6+ Mos 04/02/2015   Influenza-Unspecified 02/25/2021, 01/24/2022   PFIZER(Purple Top)SARS-COV-2 Vaccination 07/08/2019, 07/29/2019, 02/10/2020, 09/07/2020   Pneumococcal Conjugate-13 06/19/2015   Pneumococcal Polysaccharide-23 03/05/2012   Rsv, Bivalent, Protein Subunit Rsvpref,pf Evans Lance) 01/24/2022   Tdap 10/14/2010   Zoster, Live 02/25/2011    TDAP status: Due, Education has been provided regarding the importance of this vaccine. Advised may receive this vaccine at local pharmacy or Health Dept. Aware to provide a copy of the vaccination record if obtained from local pharmacy or Health Dept. Verbalized acceptance and understanding.  Flu Vaccine status: Up to date  Pneumococcal vaccine status: Up to date  Covid-19 vaccine status: Completed vaccines  Qualifies for Shingles Vaccine? Yes   Zostavax completed No   Shingrix Completed?: No.    Education has been provided regarding the importance of this vaccine. Patient has been advised to call insurance company to determine out of pocket expense if they have not yet received this vaccine. Advised may also receive vaccine at local pharmacy or Health Dept. Verbalized acceptance and understanding.  Screening Tests Health Maintenance  Topic Date Due   Zoster Vaccines- Shingrix (1 of 2) Never done    DTaP/Tdap/Td (2 - Td or Tdap) 10/13/2020   COVID-19 Vaccine (5 - 2023-24 season) 01/14/2022   DEXA SCAN  04/15/2022   MAMMOGRAM  04/20/2022   Medicare Annual Wellness (AWV)  04/20/2023   COLONOSCOPY (Pts 45-40yr Insurance coverage will need to be confirmed)  11/05/2026   Pneumonia Vaccine 76 Years old  Completed   INFLUENZA VACCINE  Completed   Hepatitis C Screening  Completed   HPV VACCINES  Aged Out    Health Maintenance  Health Maintenance Due  Topic Date Due   Zoster Vaccines- Shingrix (1 of 2) Never done   DTaP/Tdap/Td (2 - Td or Tdap) 10/13/2020   COVID-19 Vaccine (5 - 2023-24 season) 01/14/2022   DEXA SCAN  04/15/2022    Colorectal cancer screening: Type of screening: Colonoscopy. Completed 11/04/2021. Repeat every 5 years   Bone Density status: Ordered 04/19/2022. Pt provided with contact info and advised to call to schedule appt.  Lung Cancer Screening: (Low Dose CT Chest recommended if Age 76-80years, 30 pack-year currently smoking  OR have quit w/in 15years.) does not qualify.   Lung Cancer Screening Referral: n/a  Additional Screening:  Hepatitis C Screening: does not qualify; Completed 06/28/2016  Vision Screening: Recommended annual ophthalmology exams for early detection of glaucoma and other disorders of the eye. Is the patient up to date with their annual eye exam?  Yes  Who is the provider or what is the name of the office in which the patient attends annual eye exams? [facility]/Dr. Dingledein If pt is not established with a provider, would they like to be referred to a provider to establish care? No .   Dental Screening: Recommended annual dental exams for proper oral hygiene  Community Resource Referral / Chronic Care Management: CRR required this visit?  No   CCM required this visit?  No      Plan:     I have personally reviewed and noted the following in the patient's chart:   Medical and social history Use of alcohol, tobacco  or illicit drugs  Current medications and supplements including opioid prescriptions. Patient is not currently taking opioid prescriptions. Functional ability and status Nutritional status Physical activity Advanced directives List of other physicians Hospitalizations, surgeries, and ER visits in previous 12 months Vitals Screenings to include cognitive, depression, and falls Referrals and appointments  In addition, I have reviewed and discussed with patient certain preventive protocols, quality metrics, and best practice recommendations. A written personalized care plan for preventive services as well as general preventive health recommendations were provided to patient.     Royal Hawthorn, [place]   04/19/2022   Nurse Notes:  Ms. Phegley , Thank you for taking time to come for your Medicare Wellness Visit. I appreciate your ongoing commitment to your health goals. Please review the following plan we discussed and let me know if I can assist you in the future.   These are the goals we discussed:  Goals      DIET - INCREASE WATER INTAKE     Recommend to drink at least 6-8 8oz glasses of water per day.     Increase physical activity     Recommend increasing physical activity to at least 3 days per week        This is a list of the screening recommended for you and due dates:  Health Maintenance  Topic Date Due   Zoster (Shingles) Vaccine (1 of 2) Never done   DTaP/Tdap/Td vaccine (2 - Td or Tdap) 10/13/2020   COVID-19 Vaccine (5 - 2023-24 season) 01/14/2022   DEXA scan (bone density measurement)  04/15/2022   Mammogram  04/20/2022   Medicare Annual Wellness Visit  04/20/2023   Colon Cancer Screening  11/05/2026   Pneumonia Vaccine  Completed   Flu Shot  Completed   Hepatitis C Screening: USPSTF Recommendation to screen - Ages 18-79 yo.  Completed   HPV Vaccine  Aged Out

## 2022-04-20 ENCOUNTER — Other Ambulatory Visit: Payer: Self-pay | Admitting: Nurse Practitioner

## 2022-04-20 NOTE — Telephone Encounter (Signed)
Requested medication (s) are due for refill today -yes  Requested medication (s) are on the active medication list -yes  Future visit scheduled -yes  Last refill: 02/01/22 #12  Notes to clinic: fails lab/imaging protocol- sent for review   Requested Prescriptions  Pending Prescriptions Disp Refills   alendronate (FOSAMAX) 70 MG tablet [Pharmacy Med Name: ALENDRONATE  TAB 70MG] 12 tablet 0    Sig: TAKE 1 TABLET EVERY 7 DAYS,TAKE WITH A FULL GLASS OF  WATER ON AN EMPTY STOMACH     Endocrinology:  Bisphosphonates Failed - 04/20/2022 10:58 AM      Failed - Vitamin D in normal range and within 360 days    Vit D, 25-Hydroxy  Date Value Ref Range Status  05/06/2019 33 30 - 100 ng/mL Final    Comment:    Vitamin D Status         25-OH Vitamin D: . Deficiency:                    <20 ng/mL Insufficiency:             20 - 29 ng/mL Optimal:                 > or = 30 ng/mL . For 25-OH Vitamin D testing on patients on  D2-supplementation and patients for whom quantitation  of D2 and D3 fractions is required, the QuestAssureD(TM) 25-OH VIT D, (D2,D3), LC/MS/MS is recommended: order  code 92888 (patients >2yrs). See Note 1 . Note 1 . For additional information, please refer to  http://education.Quest[facility].com/faq/FAQ199  (This link is being provided for informational/ educational purposes only.)          Failed - Cr in normal range and within 360 days    Creat  Date Value Ref Range Status  09/13/2021 1.24 (H) 0.60 - 1.00 mg/dL Final         Failed - Mg Level in normal range and within 360 days    Magnesium  Date Value Ref Range Status  09/03/2019 2.2 1.5 - 2.5 mg/dL Final         Failed - Phosphate in normal range and within 360 days    Phosphorus  Date Value Ref Range Status  09/07/2016 3.7 2.5 - 4.5 mg/dL Final         Failed - Bone Mineral Density or Dexa Scan completed in the last 2 years      Passed - Ca in normal range and within 360 days    Calcium  Date  Value Ref Range Status  09/13/2021 8.9 8.6 - 10.4 mg/dL Final   Calcium, Total  Date Value Ref Range Status  12/05/2013 9.1 8.5 - 10.1 mg/dL Final         Passed - eGFR is 30 or above and within 360 days    GFR, Est African American  Date Value Ref Range Status  08/20/2020 50 (L) > OR = 60 mL/min/1.73m2 Final   GFR, Est Non African American  Date Value Ref Range Status  08/20/2020 43 (L) > OR = 60 mL/min/1.73m2 Final   eGFR  Date Value Ref Range Status  09/13/2021 45 (L) > OR = 60 mL/min/1.73m2 Final    Comment:    The eGFR is based on the CKD-EPI 2021 equation. To calculate  the new eGFR from a previous Creatinine or Cystatin C result, go to https://www.kidney.org/professionals/ kdoqi/gfr%5Fcalculator          Passed - Valid encounter within   last 12 months    Recent Outpatient Visits           7 months ago Annual physical exam   [facility] Pender, Julie F, FNP   7 months ago Primary hypertension   [facility] Mecum, Erin E, PA-C   1 year ago Low back pain radiating to both legs   [facility] Pender, Julie F, FNP   1 year ago Primary hypertension   [facility] Rumball, Alison M, DO   1 year ago Stage 3a chronic kidney disease (HCC)   [facility] Tapia, Leisa, PA-C       Future Appointments             In 1 year [facility]                Requested Prescriptions  Pending Prescriptions Disp Refills   alendronate (FOSAMAX) 70 MG tablet [Pharmacy Med Name: ALENDRONATE  TAB 70MG] 12 tablet 0    Sig: TAKE 1 TABLET EVERY 7 DAYS,TAKE WITH A FULL GLASS OF  WATER ON AN EMPTY STOMACH     Endocrinology:  Bisphosphonates Failed - 04/20/2022 10:58 AM      Failed - Vitamin D in normal range and within 360 days    Vit D, 25-Hydroxy  Date Value Ref Range Status  05/06/2019 33 30 - 100 ng/mL Final    Comment:    Vitamin D Status         25-OH  Vitamin D: . Deficiency:                    <20 ng/mL Insufficiency:             20 - 29 ng/mL Optimal:                 > or = 30 ng/mL . For 25-OH Vitamin D testing on patients on  D2-supplementation and patients for whom quantitation  of D2 and D3 fractions is required, the QuestAssureD(TM) 25-OH VIT D, (D2,D3), LC/MS/MS is recommended: order  code 92888 (patients >2yrs). See Note 1 . Note 1 . For additional information, please refer to  http://education.[facility].com/faq/FAQ199  (This link is being provided for informational/ educational purposes only.)          Failed - Cr in normal range and within 360 days    Creat  Date Value Ref Range Status  09/13/2021 1.24 (H) 0.60 - 1.00 mg/dL Final         Failed - Mg Level in normal range and within 360 days    Magnesium  Date Value Ref Range Status  09/03/2019 2.2 1.5 - 2.5 mg/dL Final         Failed - Phosphate in normal range and within 360 days    Phosphorus  Date Value Ref Range Status  09/07/2016 3.7 2.5 - 4.5 mg/dL Final         Failed - Bone Mineral Density or Dexa Scan completed in the last 2 years      Passed - Ca in normal range and within 360 days    Calcium  Date Value Ref Range Status  09/13/2021 8.9 8.6 - 10.4 mg/dL Final   Calcium, Total  Date Value Ref Range Status  12/05/2013 9.1 8.5 - 10.1 mg/dL Final         Passed - eGFR is 30 or above and within 360 days      GFR, Est African American  Date Value Ref Range Status  08/20/2020 50 (L) > OR = 60 mL/min/1.73m2 Final   GFR, Est Non African American  Date Value Ref Range Status  08/20/2020 43 (L) > OR = 60 mL/min/1.73m2 Final   eGFR  Date Value Ref Range Status  09/13/2021 45 (L) > OR = 60 mL/min/1.73m2 Final    Comment:    The eGFR is based on the CKD-EPI 2021 equation. To calculate  the new eGFR from a previous Creatinine or Cystatin C result, go to https://www.kidney.org/professionals/ kdoqi/gfr%5Fcalculator          Passed -  Valid encounter within last 12 months    Recent Outpatient Visits           7 months ago Annual physical exam   [facility] Pender, Julie F, FNP   7 months ago Primary hypertension   [facility] Mecum, Erin E, PA-C   1 year ago Low back pain radiating to both legs   [facility] Pender, Julie F, FNP   1 year ago Primary hypertension   [facility] Rumball, Alison M, DO   1 year ago Stage 3a chronic kidney disease (HCC)   [facility] Tapia, Leisa, PA-C       Future Appointments             In 1 year [facility]                

## 2022-04-27 ENCOUNTER — Ambulatory Visit
Admission: RE | Admit: 2022-04-27 | Discharge: 2022-04-27 | Disposition: A | Discharge status: Home / Self Care | Payer: Medicare HMO | Source: Ambulatory Visit | Attending: Family Medicine | Admitting: Family Medicine

## 2022-04-27 ENCOUNTER — Inpatient Hospital Stay: Admission: RE | Admit: 2022-04-27 | Payer: Medicare HMO | Source: Ambulatory Visit

## 2022-04-27 ENCOUNTER — Ambulatory Visit: Payer: Medicare HMO

## 2022-04-27 DIAGNOSIS — M8589 Other specified disorders of bone density and structure, multiple sites: Secondary | ICD-10-CM | POA: Diagnosis not present

## 2022-04-27 DIAGNOSIS — M810 Age-related osteoporosis without current pathological fracture: Secondary | ICD-10-CM

## 2022-04-27 DIAGNOSIS — M816 Localized osteoporosis [Lequesne]: Secondary | ICD-10-CM

## 2022-04-27 DIAGNOSIS — Z1231 Encounter for screening mammogram for malignant neoplasm of breast: Secondary | ICD-10-CM | POA: Insufficient documentation

## 2022-04-27 DIAGNOSIS — Z780 Asymptomatic menopausal state: Secondary | ICD-10-CM | POA: Diagnosis not present

## 2022-05-05 ENCOUNTER — Inpatient Hospital Stay: Payer: Medicare HMO | Attending: Oncology

## 2022-05-05 ENCOUNTER — Encounter: Payer: Self-pay | Admitting: Oncology

## 2022-05-05 ENCOUNTER — Ambulatory Visit: Payer: Medicare HMO

## 2022-05-05 ENCOUNTER — Inpatient Hospital Stay: Payer: Medicare HMO

## 2022-05-05 ENCOUNTER — Ambulatory Visit: Payer: Medicare HMO | Admitting: Oncology

## 2022-05-05 ENCOUNTER — Other Ambulatory Visit: Payer: Medicare HMO

## 2022-05-05 ENCOUNTER — Inpatient Hospital Stay (HOSPITAL_BASED_OUTPATIENT_CLINIC_OR_DEPARTMENT_OTHER): Payer: Medicare HMO | Admitting: Oncology

## 2022-05-05 ENCOUNTER — Other Ambulatory Visit: Payer: Self-pay

## 2022-05-05 VITALS — BP 135/66 | HR 59 | Temp 96.3°F | Resp 18 | Ht 62.0 in | Wt 154.4 lb

## 2022-05-05 DIAGNOSIS — K649 Unspecified hemorrhoids: Secondary | ICD-10-CM | POA: Diagnosis not present

## 2022-05-05 DIAGNOSIS — Z807 Family history of other malignant neoplasms of lymphoid, hematopoietic and related tissues: Secondary | ICD-10-CM | POA: Insufficient documentation

## 2022-05-05 DIAGNOSIS — I129 Hypertensive chronic kidney disease with stage 1 through stage 4 chronic kidney disease, or unspecified chronic kidney disease: Secondary | ICD-10-CM | POA: Diagnosis not present

## 2022-05-05 DIAGNOSIS — Z803 Family history of malignant neoplasm of breast: Secondary | ICD-10-CM | POA: Diagnosis not present

## 2022-05-05 DIAGNOSIS — N1831 Chronic kidney disease, stage 3a: Secondary | ICD-10-CM | POA: Diagnosis not present

## 2022-05-05 DIAGNOSIS — D631 Anemia in chronic kidney disease: Secondary | ICD-10-CM

## 2022-05-05 DIAGNOSIS — Z808 Family history of malignant neoplasm of other organs or systems: Secondary | ICD-10-CM | POA: Diagnosis not present

## 2022-05-05 DIAGNOSIS — K625 Hemorrhage of anus and rectum: Secondary | ICD-10-CM | POA: Diagnosis not present

## 2022-05-05 DIAGNOSIS — N189 Chronic kidney disease, unspecified: Secondary | ICD-10-CM | POA: Diagnosis not present

## 2022-05-05 LAB — HEMOGLOBIN AND HEMATOCRIT, BLOOD
HCT: 29.7 % — ABNORMAL LOW (ref 36.0–46.0)
Hemoglobin: 9.8 g/dL — ABNORMAL LOW (ref 12.0–15.0)

## 2022-05-05 MED ORDER — EPOETIN ALFA 10000 UNIT/ML IJ SOLN
40000.0000 [IU] | Freq: Once | INTRAMUSCULAR | Status: AC
  Administered 2022-05-05: 40000 [IU] via SUBCUTANEOUS
  Filled 2022-05-05: qty 4

## 2022-05-05 MED ORDER — EPOETIN ALFA 40000 UNIT/ML IJ SOLN
40000.0000 [IU] | Freq: Once | INTRAMUSCULAR | Status: DC
  Filled 2022-05-05: qty 1

## 2022-05-05 NOTE — Progress Notes (Signed)
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  INTERVAL HISTORY: Ms. 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  She last received Epogen on 02/03/2022 40,000 units for hemoglobin of 9.9.  She denies any new concerns today.  She was evaluated by Dr. Vicente Males on 11/09/2021 for rectal bleeding. Had a colonoscopy on 11/04/2021 which showed multiple diverticula seen in left colon 1 was actively bleeding which was injected with epinephrine and clipped.  She continues to have intermittent bright red rectal bleeding and was instructed to reach back out to GI should it be persistent.   REVIEW OF SYSTEMS:   Review of Systems  Constitutional: Negative.  Negative for fever, malaise/fatigue and weight loss.  Respiratory: Negative.  Negative for cough, hemoptysis and shortness of breath.   Cardiovascular: Negative.  Negative for chest pain and leg swelling.  Gastrointestinal:  Positive for blood in stool. Negative for abdominal pain and melena.  Genitourinary: Negative.  Negative for hematuria.  Musculoskeletal: Negative.  Negative for back pain.  Skin: Negative.  Negative for rash.  Neurological: Negative.  Negative for dizziness, focal weakness, weakness and headaches.  Psychiatric/Behavioral:  The patient is not nervous/anxious.     As per HPI. Otherwise, a complete review of systems is negative.  PAST MEDICAL HISTORY: Past Medical History:  Diagnosis Date   Age-related osteoporosis without current pathological fracture  09/09/2021   Allergy    Anemia 2000   estimate   Anemia    Blood transfusion without reported diagnosis 04/07/2017   estimate   Bradycardia    Bursitis    CKD (chronic kidney disease) stage 3, GFR 30-59 ml/min (HCC)    Diverticulosis    GERD (gastroesophageal reflux disease)    Hiatal hernia    Hypertension    Hypothyroidism    Migraine with aura    states she doesn't have a headache, just the aura   Mixed hyperlipidemia    Osteoarthritis of knee    Thyroid disease     PAST SURGICAL HISTORY: Past Surgical History:  Procedure Laterality Date   CHOLECYSTECTOMY     COLONOSCOPY  06/02/2014   cleared for 10 yrs- Dr Rayann Heman   COLONOSCOPY N/A 04/07/2017   Procedure: COLONOSCOPY;  Surgeon: Lin Landsman, MD;  Location: [place];  Service: Gastroenterology;  Laterality: N/A;   COLONOSCOPY WITH PROPOFOL N/A 11/04/2021   Procedure: COLONOSCOPY WITH PROPOFOL;  Surgeon: Jonathon Bellows, MD;  Location: [facility] ENDOSCOPY;  Service: Gastroenterology;  Laterality: N/A;   ESOPHAGEAL DILATION     ESOPHAGEAL DILATION  10/21/2021   Procedure: ESOPHAGEAL DILATION;  Surgeon: Lucilla Lame, MD;  Location: [place];  Service: Endoscopy;;  12-18 mm   ESOPHAGOGASTRODUODENOSCOPY N/A 04/07/2017   Procedure: ESOPHAGOGASTRODUODENOSCOPY (EGD);  Surgeon: Lin Landsman, MD;  Location: [facility] ENDOSCOPY;  Service: Gastroenterology;  Laterality: N/A;   ESOPHAGOGASTRODUODENOSCOPY N/A 10/21/2021   Procedure: ESOPHAGOGASTRODUODENOSCOPY (EGD);  Surgeon: Lucilla Lame, MD;  Location: [place];  Service: Endoscopy;  Laterality: N/A;   HERNIA REPAIR  08/28/2014   estimate   TONSILLECTOMY     TUBAL LIGATION  1975   estimate  UPPER GI ENDOSCOPY  06/02/2014    FAMILY HISTORY: Family History  Problem Relation Age of Onset   Kidney disease Mother    Cancer Mother        brain   Arthritis Mother    Obesity Mother    Varicose Veins Mother    Heart disease Father    Heart attack Father     Hearing loss Father    Hypertension Father    Lupus Sister    Diabetes Sister    Thyroid disease Sister    Rheum arthritis Sister    Arthritis Sister    Obesity Sister    Diabetes Sister    Arthritis Sister    Cancer Sister    Hearing loss Sister    Intellectual disability Sister    Learning disabilities Sister    Multiple myeloma Sister    Thyroid disease Sister    Rheum arthritis Sister    Arthritis Sister    Cancer Sister    Obesity Sister    Breast cancer Maternal Aunt    Miscarriages / [place] Daughter     ADVANCED DIRECTIVES (Y/N):  N  HEALTH MAINTENANCE: Social History   Tobacco Use   Smoking status: Never   Smokeless tobacco: Never   Tobacco comments:    Never, but father and husband smoked  Vaping Use   Vaping Use: Never used  Substance Use Topics   Alcohol use: Not Currently    Alcohol/week: 0.0 standard drinks of alcohol    Comment: on special occasions like New Years Eve   Drug use: Never     Colonoscopy:  PAP:  Bone density:  Lipid panel:  Allergies  Allergen Reactions   Lisinopril     cough   Codeine Nausea Only and Nausea And Vomiting    Current Outpatient Medications  Medication Sig Dispense Refill   alendronate (FOSAMAX) 70 MG tablet TAKE 1 TABLET EVERY 7 DAYS,TAKE WITH A FULL GLASS OF  WATER ON AN EMPTY STOMACH 12 tablet 0   AREXVY 120 MCG/0.5ML injection      aspirin EC 81 MG tablet Take 81 mg by mouth every other day.     cetirizine (ZYRTEC) 10 MG tablet Take 10 mg by mouth daily. otc     Cholecalciferol (VITAMIN D-3) 125 MCG (5000 UT) TABS Take 1 capsule by mouth daily.     Cholecalciferol (VITAMIN D-3) 125 MCG (5000 UT) TABS      epoetin alfa (EPOGEN) 10000 UNIT/ML injection Inject into the vein.     fluticasone (FLONASE) 50 MCG/ACT nasal spray Place 2 sprays into both nostrils daily. PRN 16 g 2   FLUZONE HIGH-DOSE QUADRIVALENT 0.7 ML SUSY      levothyroxine (EUTHYROX) 88 MCG tablet Take 1 tablet (88 mcg total) by mouth  daily. First thing in the morning on empty stomach 90 tablet 3   losartan (COZAAR) 100 MG tablet Take 100 mg by mouth daily.     lovastatin (MEVACOR) 20 MG tablet Take 1 tablet (20 mg total) by mouth at bedtime. 90 tablet 3   montelukast (SINGULAIR) 10 MG tablet Take 1 tablet (10 mg total) by mouth daily. 90 tablet 3   Na Sulfate-K Sulfate-Mg Sulf 17.5-3.13-1.6 GM/177ML SOLN TAKE 354MLS BY MOUTH ONCE FOR 1 DOSE - STARTING AT 5PM TAKE ONE BOTTLE & POUR INTO THE SUPPLIED CUP, ADD COOL WATER TO THE FILL 16OZ LINE & DRINK ALL. THEN 5 HOURS BEFORE PROCEDURE POUR THE SECOND BOTTLE INTO THE SUPPLIED CUP ADD COOL  WATER TO THE FILL 16     No current facility-administered medications for this visit.    OBJECTIVE: There were no vitals filed for this visit.    There is no height or weight on file to calculate BMI.    ECOG FS:0 - Asymptomatic  Physical Exam Constitutional:      Appearance: Normal appearance.  Abdominal:     Palpations: Abdomen is soft.  Neurological:     General: No focal deficit present.     Mental Status: She is alert and oriented to person, place, and time.     LAB RESULTS:  Lab Results  Component Value Date   NA 141 09/13/2021   K 3.9 09/13/2021   CL 108 09/13/2021   CO2 26 09/13/2021   GLUCOSE 100 (H) 09/13/2021   BUN 14 09/13/2021   CREATININE 1.24 (H) 09/13/2021   CALCIUM 8.9 09/13/2021   PROT 7.0 09/13/2021   ALBUMIN 3.5 04/06/2017   AST 14 09/13/2021   ALT 8 09/13/2021   ALKPHOS 58 04/06/2017   BILITOT 0.9 09/13/2021   GFRNONAA 43 (L) 08/20/2020   GFRAA 50 (L) 08/20/2020    Lab Results  Component Value Date   WBC 9.0 12/30/2021   NEUTROABS 5.3 11/29/2021   HGB 10.0 (L) 03/03/2022   HCT 32.2 (L) 03/03/2022   MCV 97.4 12/30/2021   PLT 168 12/30/2021     STUDIES: MM 3D SCREEN BREAST BILATERAL  Result Date: 04/28/2022 CLINICAL DATA:  Screening. EXAM: DIGITAL SCREENING BILATERAL MAMMOGRAM WITH TOMOSYNTHESIS AND CAD TECHNIQUE: Bilateral screening  digital craniocaudal and mediolateral oblique mammograms were obtained. Bilateral screening digital breast tomosynthesis was performed. The images were evaluated with computer-aided detection. COMPARISON:  Previous exam(s). ACR Breast Density Category a: The breast tissue is almost entirely fatty. FINDINGS: There are no findings suspicious for malignancy. IMPRESSION: No mammographic evidence of malignancy. A result letter of this screening mammogram will be mailed directly to the patient. RECOMMENDATION: Screening mammogram in one year. (Code:SM-B-01Y) BI-RADS CATEGORY  1: Negative. Electronically Signed   By: Everlean Alstrom M.D.   On: 04/28/2022 16:10   DG Bone Density  Result Date: 04/27/2022 EXAM: DUAL X-RAY ABSORPTIOMETRY (DXA) FOR BONE MINERAL DENSITY IMPRESSION: Your patient Sahithi Ordoyne completed a BMD test on 04/27/2022 using the [place] (software version: 14.10) manufactured by [employer]. The following summarizes the results of our evaluation. Technologist: [employer] [facility] PATIENT BIOGRAPHICAL: Name: Tyan, Lasure Patient ID: [id number] Birth Date: [birth year] Height: 62.0 in. Gender: Female Exam Date: 04/27/2022 Weight: 155.0 lbs. Indications: Family History of Fracture, Advanced Age, Caucasian, Height Loss, History of kidney disease, Postmenopausal Fractures: Treatments: Calcium, Fosamax, Vitamin D DENSITOMETRY RESULTS: Site      Region     Measured Date Measured Age WHO Classification Young Adult T-score BMD         %Change vs. Previous Significant Change (*) AP Spine L1-L4 04/27/2022 76.6 Osteopenia -2.1 0.929 g/cm2 6.7% Yes AP Spine L1-L4 04/15/2020 74.5 Osteoporosis -2.6 0.871 g/cm2 -5.7% Yes AP Spine L1-L4 09/24/2015 70.0 Osteopenia -2.2 0.924 g/cm2 3.5% Yes AP Spine L1-L4 12/29/2011 66.2 Osteopenia -2.4 0.893 g/cm2 1.4% - AP Spine L1-L4 09/05/2007 61.9 Osteoporosis -2.5 0.881 g/cm2 - - DualFemur Neck Right 04/27/2022 76.6 Osteopenia -1.8 0.792 g/cm2 4.9% - DualFemur  Neck Right 04/15/2020 74.5 Osteopenia -2.0 0.755 g/cm2 -9.0% Yes DualFemur Neck Right 09/24/2015 70.0 Osteopenia -1.5 0.830 g/cm2 0.4% - DualFemur Neck Right 12/29/2011 66.2 Osteopenia -1.5 0.827 g/cm2 -11.8% Yes DualFemur Neck Right 09/05/2007 61.9 Normal -  0.7 0.938 g/cm2 - - DualFemur Total Mean 04/27/2022 76.6 Normal -1.0 0.877 g/cm2 8.7% Yes DualFemur Total Mean 04/15/2020 74.5 Osteopenia -1.6 0.807 g/cm2 -13.5% Yes DualFemur Total Mean 09/24/2015 70.0 Normal -0.6 0.933 g/cm2 -9.1% Yes DualFemur Total Mean 12/29/2011 66.2 Normal 0.1 1.026 g/cm2 -1.2% - DualFemur Total Mean 09/05/2007 61.9 Normal 0.2 1.038 g/cm2 - - ASSESSMENT: The BMD measured at AP Spine L1-L4 is 0.929 g/cm2 with a T-score of -2.1. This patient is considered osteopenic according to [facility]) criteria. The scan quality is good. Patient is not a candidate for FRAX due to Fosamax. Compared with prior study, there has been significant increase in the spine. Compared with prior study, there has been significant increase in the total hip. [facility]) criteria for post-menopausal, Caucasian Women: Normal:                   T-score at or above -1 SD Osteopenia/low bone mass: T-score between -1 and -2.5 SD Osteoporosis:             T-score at or below -2.5 SD RECOMMENDATIONS: 1. All patients should optimize calcium and vitamin D intake. 2. Consider FDA-approved medical therapies in postmenopausal women and men aged 59 years and older, based on the following: a. A hip or vertebral(clinical or morphometric) fracture b. T-score < -2.5 at the femoral neck or spine after appropriate evaluation to exclude secondary causes c. Low bone mass (T-score between -1.0 and -2.5 at the femoral neck or spine) and a 10-year probability of a hip fracture > 3% or a 10-year probability of a major osteoporosis-related fracture > 20% based on the [place]-adapted WHO algorithm 3. Clinician judgment and/or patient preferences may indicate  treatment for people with 10-year fracture probabilities above or below these levels FOLLOW-UP: People with diagnosed cases of osteoporosis or at high risk for fracture should have regular bone mineral density tests. For patients eligible for Medicare, routine testing is allowed once every 2 years. The testing frequency can be increased to one year for patients who have rapidly progressing disease, those who are receiving or discontinuing medical therapy to restore bone mass, or have additional risk factors. I have reviewed this report, and agree with the above findings. [facility] Electronically Signed   By: Marin Olp M.D.   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 Last Retacrit was in September 2023.  Labs from today show a hemoglobin of 9.8.  Proceed with 40,000 Retacrit. RTC in 6 weeks for labs and possible retacrit and in 12 weeks for labs, see [place] Ganja and retacrit.   Chronic renal insufficiency- Stage IIIa. Followed by nephrology.  Rectal bleeding- Secondary to hemorrhoids and diverticuli per GI and recent colonoscopy. Followed by Dr. Vicente Males.  Disposition- Retacrit today. Return to clinic in 6 weeks for labs +/- retacrit and in 12 weeks for labs, MD assessment plus or minus Retacrit.    I spent 25 minutes dedicated to the care of this patient (face-to-face and non-face-to-face) on the date of the encounter to include what is described in the assessment and plan.  Patient expressed understanding and was in agreement with this plan. She also understands that She can call clinic at any time with any questions, concerns, or complaints.    Jacquelin Hawking, NP   05/05/2022 12:41 PM

## 2022-06-16 ENCOUNTER — Inpatient Hospital Stay: Payer: Medicare HMO

## 2022-06-16 ENCOUNTER — Inpatient Hospital Stay: Payer: Medicare HMO | Attending: Oncology

## 2022-06-16 DIAGNOSIS — N1831 Chronic kidney disease, stage 3a: Secondary | ICD-10-CM | POA: Insufficient documentation

## 2022-06-16 DIAGNOSIS — I129 Hypertensive chronic kidney disease with stage 1 through stage 4 chronic kidney disease, or unspecified chronic kidney disease: Secondary | ICD-10-CM | POA: Insufficient documentation

## 2022-06-16 DIAGNOSIS — D631 Anemia in chronic kidney disease: Secondary | ICD-10-CM | POA: Insufficient documentation

## 2022-06-16 LAB — HEMOGLOBIN AND HEMATOCRIT, BLOOD
HCT: 31.8 % — ABNORMAL LOW (ref 36.0–46.0)
Hemoglobin: 10.2 g/dL — ABNORMAL LOW (ref 12.0–15.0)

## 2022-07-10 ENCOUNTER — Other Ambulatory Visit: Payer: Self-pay | Admitting: Nurse Practitioner

## 2022-07-11 NOTE — Telephone Encounter (Signed)
Requested medications are due for refill today.  A little too soon  Requested medications are on the active medications list.  yes  Last refill. 04/20/2022 #12 [id number]  Future visit scheduled.   yes  Notes to clinic.  Expired labs    Requested Prescriptions  Pending Prescriptions Disp Refills   alendronate (FOSAMAX) 70 MG tablet [Pharmacy Med Name: ALENDRONATE  TAB '70MG'$ ] 12 tablet 0    Sig: TAKE 1 TABLET EVERY 7 DAYS,TAKE WITH A FULL GLASS OF  WATER ON AN EMPTY STOMACH     Endocrinology:  Bisphosphonates Failed - 07/10/2022  1:18 PM      Failed - Vitamin D in normal range and within 360 days    Vit D, 25-Hydroxy  Date Value Ref Range Status  05/06/2019 33 30 - 100 ng/mL Final    Comment:    Vitamin D Status         25-OH Vitamin D: . Deficiency:                    <20 ng/mL Insufficiency:             20 - 29 ng/mL Optimal:                 > or = 30 ng/mL . For 25-OH Vitamin D testing on patients on  D2-supplementation and patients for whom quantitation  of D2 and D3 fractions is required, the [employer](TM) 25-OH VIT D, (D2,D3), LC/MS/MS is recommended: order  code [phone] (patients >61yr). See Note 1 . Note 1 . For additional information, please refer to  http://education.[facility].com/faq/FAQ199  (This link is being provided for informational/ educational purposes only.)          Failed - Cr in normal range and within 360 days    Creat  Date Value Ref Range Status  09/13/2021 1.24 (H) 0.60 - 1.00 mg/dL Final         Failed - Mg Level in normal range and within 360 days    Magnesium  Date Value Ref Range Status  09/03/2019 2.2 1.5 - 2.5 mg/dL Final         Failed - Phosphate in normal range and within 360 days    Phosphorus  Date Value Ref Range Status  09/07/2016 3.7 2.5 - 4.5 mg/dL Final         Passed - Ca in normal range and within 360 days    Calcium  Date Value Ref Range Status  09/13/2021 8.9 8.6 - 10.4 mg/dL Final   Calcium, Total  Date  Value Ref Range Status  12/05/2013 9.1 8.5 - 10.1 mg/dL Final         Passed - eGFR is 30 or above and within 360 days    GFR, Est African American  Date Value Ref Range Status  08/20/2020 50 (L) > OR = 60 mL/min/1.787mFinal   GFR, Est Non African American  Date Value Ref Range Status  08/20/2020 43 (L) > OR = 60 mL/min/1.7353minal   eGFR  Date Value Ref Range Status  09/13/2021 45 (L) > OR = 60 mL/min/1.30m27mnal    Comment:    The eGFR is based on the CKD-EPI 2021 equation. To calculate  the new eGFR from a previous Creatinine or Cystatin C result, go to https://www.kidney.org/professionals/ kdoqi/gfr%5Fcalculator          Passed - Valid encounter within last 12 months    Recent Outpatient Visits  9 months ago Annual physical exam   [facility] Bo Merino, FNP   10 months ago Primary hypertension   [facility] Mecum, Dani Gobble, PA-C   1 year ago Low back pain radiating to both legs   [place], FNP   1 year ago Primary hypertension   [facility] Myles Gip, DO   1 year ago Stage 3a chronic kidney disease [facility])   [facility] Delsa Grana, PA-C       Future Appointments             In 9 months [place] Density or Dexa Scan completed in the last 2 years

## 2022-07-25 DIAGNOSIS — N1832 Chronic kidney disease, stage 3b: Secondary | ICD-10-CM | POA: Diagnosis not present

## 2022-07-26 ENCOUNTER — Telehealth: Payer: Self-pay | Admitting: *Deleted

## 2022-07-26 NOTE — Telephone Encounter (Signed)
Patient called reporting that she has an appointment tomorrow for lab/ doctor/ injection, but she had labs drawn at [facility] yesterday and her HGB is 1036 so she is asking if she needs to come tomorrow or if it can be pushed out to a later date. Please advise

## 2022-07-27 ENCOUNTER — Encounter: Payer: Self-pay | Admitting: Oncology

## 2022-07-27 DIAGNOSIS — N2581 Secondary hyperparathyroidism of renal origin: Secondary | ICD-10-CM | POA: Diagnosis not present

## 2022-07-27 DIAGNOSIS — D631 Anemia in chronic kidney disease: Secondary | ICD-10-CM | POA: Diagnosis not present

## 2022-07-27 DIAGNOSIS — I10 Essential (primary) hypertension: Secondary | ICD-10-CM | POA: Diagnosis not present

## 2022-07-27 DIAGNOSIS — N1832 Chronic kidney disease, stage 3b: Secondary | ICD-10-CM | POA: Diagnosis not present

## 2022-07-28 ENCOUNTER — Inpatient Hospital Stay: Payer: Medicare HMO | Admitting: Oncology

## 2022-07-28 ENCOUNTER — Inpatient Hospital Stay: Payer: Medicare HMO

## 2022-08-04 ENCOUNTER — Other Ambulatory Visit: Payer: Self-pay | Admitting: Family Medicine

## 2022-08-04 DIAGNOSIS — E782 Mixed hyperlipidemia: Secondary | ICD-10-CM

## 2022-08-04 DIAGNOSIS — J309 Allergic rhinitis, unspecified: Secondary | ICD-10-CM

## 2022-08-04 DIAGNOSIS — E039 Hypothyroidism, unspecified: Secondary | ICD-10-CM

## 2022-08-05 DIAGNOSIS — J45909 Unspecified asthma, uncomplicated: Secondary | ICD-10-CM | POA: Diagnosis not present

## 2022-08-05 DIAGNOSIS — I700 Atherosclerosis of aorta: Secondary | ICD-10-CM | POA: Diagnosis not present

## 2022-08-05 DIAGNOSIS — Z888 Allergy status to other drugs, medicaments and biological substances status: Secondary | ICD-10-CM | POA: Diagnosis not present

## 2022-08-05 DIAGNOSIS — N1832 Chronic kidney disease, stage 3b: Secondary | ICD-10-CM | POA: Diagnosis not present

## 2022-08-05 DIAGNOSIS — E785 Hyperlipidemia, unspecified: Secondary | ICD-10-CM | POA: Diagnosis not present

## 2022-08-05 DIAGNOSIS — H259 Unspecified age-related cataract: Secondary | ICD-10-CM | POA: Diagnosis not present

## 2022-08-05 DIAGNOSIS — Z809 Family history of malignant neoplasm, unspecified: Secondary | ICD-10-CM | POA: Diagnosis not present

## 2022-08-05 DIAGNOSIS — M1990 Unspecified osteoarthritis, unspecified site: Secondary | ICD-10-CM | POA: Diagnosis not present

## 2022-08-05 DIAGNOSIS — I129 Hypertensive chronic kidney disease with stage 1 through stage 4 chronic kidney disease, or unspecified chronic kidney disease: Secondary | ICD-10-CM | POA: Diagnosis not present

## 2022-08-05 DIAGNOSIS — M810 Age-related osteoporosis without current pathological fracture: Secondary | ICD-10-CM | POA: Diagnosis not present

## 2022-08-05 DIAGNOSIS — E039 Hypothyroidism, unspecified: Secondary | ICD-10-CM | POA: Diagnosis not present

## 2022-08-05 DIAGNOSIS — Z8249 Family history of ischemic heart disease and other diseases of the circulatory system: Secondary | ICD-10-CM | POA: Diagnosis not present

## 2022-09-16 DIAGNOSIS — N1831 Chronic kidney disease, stage 3a: Secondary | ICD-10-CM | POA: Diagnosis not present

## 2022-09-16 DIAGNOSIS — L03031 Cellulitis of right toe: Secondary | ICD-10-CM | POA: Diagnosis not present

## 2022-09-16 DIAGNOSIS — Z8673 Personal history of transient ischemic attack (TIA), and cerebral infarction without residual deficits: Secondary | ICD-10-CM | POA: Diagnosis not present

## 2022-09-20 DIAGNOSIS — N1831 Chronic kidney disease, stage 3a: Secondary | ICD-10-CM | POA: Diagnosis not present

## 2022-09-20 DIAGNOSIS — L03031 Cellulitis of right toe: Secondary | ICD-10-CM | POA: Diagnosis not present

## 2022-10-04 DIAGNOSIS — L03031 Cellulitis of right toe: Secondary | ICD-10-CM | POA: Diagnosis not present

## 2022-10-27 ENCOUNTER — Other Ambulatory Visit: Payer: Self-pay

## 2022-10-27 DIAGNOSIS — N189 Chronic kidney disease, unspecified: Secondary | ICD-10-CM

## 2022-10-28 ENCOUNTER — Ambulatory Visit: Payer: Medicare HMO | Admitting: Oncology

## 2022-10-28 ENCOUNTER — Encounter: Payer: Self-pay | Admitting: Medical Oncology

## 2022-10-28 ENCOUNTER — Inpatient Hospital Stay: Payer: Medicare HMO | Attending: Oncology

## 2022-10-28 ENCOUNTER — Inpatient Hospital Stay: Payer: Medicare HMO

## 2022-10-28 ENCOUNTER — Inpatient Hospital Stay: Payer: Medicare HMO | Admitting: Medical Oncology

## 2022-10-28 VITALS — BP 134/58 | HR 63 | Temp 98.1°F | Wt 157.7 lb

## 2022-10-28 DIAGNOSIS — N183 Chronic kidney disease, stage 3 unspecified: Secondary | ICD-10-CM | POA: Diagnosis not present

## 2022-10-28 DIAGNOSIS — N189 Chronic kidney disease, unspecified: Secondary | ICD-10-CM | POA: Diagnosis not present

## 2022-10-28 DIAGNOSIS — Z808 Family history of malignant neoplasm of other organs or systems: Secondary | ICD-10-CM | POA: Diagnosis not present

## 2022-10-28 DIAGNOSIS — Z807 Family history of other malignant neoplasms of lymphoid, hematopoietic and related tissues: Secondary | ICD-10-CM | POA: Insufficient documentation

## 2022-10-28 DIAGNOSIS — D631 Anemia in chronic kidney disease: Secondary | ICD-10-CM | POA: Insufficient documentation

## 2022-10-28 DIAGNOSIS — Z803 Family history of malignant neoplasm of breast: Secondary | ICD-10-CM | POA: Insufficient documentation

## 2022-10-28 DIAGNOSIS — K649 Unspecified hemorrhoids: Secondary | ICD-10-CM | POA: Diagnosis not present

## 2022-10-28 DIAGNOSIS — I129 Hypertensive chronic kidney disease with stage 1 through stage 4 chronic kidney disease, or unspecified chronic kidney disease: Secondary | ICD-10-CM | POA: Diagnosis not present

## 2022-10-28 LAB — CBC WITH DIFFERENTIAL/PLATELET
Abs Immature Granulocytes: 0.04 10*3/uL (ref 0.00–0.07)
Basophils Absolute: 0 10*3/uL (ref 0.0–0.1)
Basophils Relative: 1 %
Eosinophils Absolute: 0.1 10*3/uL (ref 0.0–0.5)
Eosinophils Relative: 1 %
HCT: 31.5 % — ABNORMAL LOW (ref 36.0–46.0)
Hemoglobin: 10.1 g/dL — ABNORMAL LOW (ref 12.0–15.0)
Immature Granulocytes: 1 %
Lymphocytes Relative: 18 %
Lymphs Abs: 1.4 10*3/uL (ref 0.7–4.0)
MCH: 31.9 pg (ref 26.0–34.0)
MCHC: 32.1 g/dL (ref 30.0–36.0)
MCV: 99.4 fL (ref 80.0–100.0)
Monocytes Absolute: 0.5 10*3/uL (ref 0.1–1.0)
Monocytes Relative: 6 %
Neutro Abs: 6 10*3/uL (ref 1.7–7.7)
Neutrophils Relative %: 73 %
Platelets: 138 10*3/uL — ABNORMAL LOW (ref 150–400)
RBC: 3.17 MIL/uL — ABNORMAL LOW (ref 3.87–5.11)
RDW: 14.8 % (ref 11.5–15.5)
WBC: 8.1 10*3/uL (ref 4.0–10.5)
nRBC: 0 % (ref 0.0–0.2)

## 2022-10-28 LAB — IRON AND TIBC
Iron: 99 ug/dL (ref 28–170)
Saturation Ratios: 23 % (ref 10.4–31.8)
TIBC: 433 ug/dL (ref 250–450)
UIBC: 334 ug/dL

## 2022-10-28 LAB — FERRITIN: Ferritin: 17 ng/mL (ref 11–307)

## 2022-10-28 NOTE — Progress Notes (Signed)
Hgb is 10.1, no retacrit today

## 2022-10-28 NOTE — Progress Notes (Signed)
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  She last received Epogen on 05/05/2022 40,000 units for hemoglobin of 9.8. She is followed closely by nephrology with her next appointment being in the next few weeks. She denies any new concerns today.  She was evaluated by Dr. Tobi Bastos on 11/09/2021 for rectal bleeding. Had a colonoscopy on 11/04/2021 which showed multiple diverticula seen in left colon 1 was actively bleeding which was injected with epinephrine and clipped. Today she states that this has been well controlled. No additional bleeding from any epistaxis,    REVIEW OF SYSTEMS:   Review of Systems  Constitutional: Negative.  Negative for fever, malaise/fatigue and weight loss.  Respiratory: Negative.  Negative for cough, hemoptysis and shortness of breath.   Cardiovascular: Negative.  Negative for chest pain and leg swelling.  Gastrointestinal:  Positive for blood in stool. Negative for abdominal pain and melena.  Genitourinary: Negative.  Negative for hematuria.  Musculoskeletal: Negative.  Negative for back pain.  Skin: Negative.  Negative for rash.  Neurological: Negative.  Negative for dizziness, focal weakness, weakness and headaches.  Psychiatric/Behavioral:  The patient is not nervous/anxious.     As per HPI. Otherwise, a complete review of systems is negative.  PAST MEDICAL HISTORY: Past Medical History:  Diagnosis Date    Age-related osteoporosis without current pathological fracture 09/09/2021   Allergy    Anemia 2000   estimate   Anemia    Blood transfusion without reported diagnosis 04/07/2017   estimate   Bradycardia    Bursitis    CKD (chronic kidney disease) stage 3, GFR 30-59 ml/min (HCC)    Diverticulosis    GERD (gastroesophageal reflux disease)    Hiatal hernia    Hypertension    Hypothyroidism    Migraine with aura    states she doesn't have a headache, just the aura   Mixed hyperlipidemia    Osteoarthritis of knee    Thyroid disease     PAST SURGICAL HISTORY: Past Surgical History:  Procedure Laterality Date   CHOLECYSTECTOMY     COLONOSCOPY  06/02/2014   cleared for 10 yrs- Dr Shelle Iron   COLONOSCOPY N/A 04/07/2017   Procedure: COLONOSCOPY;  Surgeon: Toney Reil, MD;  Location: [facility] ENDOSCOPY;  Service: Gastroenterology;  Laterality: N/A;   COLONOSCOPY WITH PROPOFOL N/A 11/04/2021   Procedure: COLONOSCOPY WITH PROPOFOL;  Surgeon: Wyline Mood, MD;  Location: [facility] ENDOSCOPY;  Service: Gastroenterology;  Laterality: N/A;   ESOPHAGEAL DILATION     ESOPHAGEAL DILATION  10/21/2021   Procedure: ESOPHAGEAL DILATION;  Surgeon: Midge Minium, MD;  Location: [facility] SURGERY CNTR;  Service: Endoscopy;;  12-18 mm   ESOPHAGOGASTRODUODENOSCOPY N/A 04/07/2017   Procedure: ESOPHAGOGASTRODUODENOSCOPY (EGD);  Surgeon: Toney Reil, MD;  Location: [facility] ENDOSCOPY;  Service: Gastroenterology;  Laterality: N/A;   ESOPHAGOGASTRODUODENOSCOPY N/A 10/21/2021   Procedure: ESOPHAGOGASTRODUODENOSCOPY (EGD);  Surgeon: Midge Minium, MD;  Location: [facility] SURGERY CNTR;  Service: Endoscopy;  Laterality: N/A;   HERNIA REPAIR  08/28/2014   estimate   TONSILLECTOMY     TUBAL  LIGATION  1975   estimate   UPPER GI ENDOSCOPY  06/02/2014    FAMILY HISTORY: Family History  Problem Relation Age of Onset   Kidney disease Mother    Cancer Mother        brain   Arthritis Mother    Obesity Mother    Varicose  Veins Mother    Heart disease Father    Heart attack Father    Hearing loss Father    Hypertension Father    Lupus Sister    Diabetes Sister    Thyroid disease Sister    Rheum arthritis Sister    Arthritis Sister    Obesity Sister    Diabetes Sister    Arthritis Sister    Cancer Sister    Hearing loss Sister    Intellectual disability Sister    Learning disabilities Sister    Multiple myeloma Sister    Thyroid disease Sister    Rheum arthritis Sister    Arthritis Sister    Cancer Sister    Obesity Sister    Breast cancer Maternal Aunt    Miscarriages / [place] Daughter     ADVANCED DIRECTIVES (Y/N):  N  HEALTH MAINTENANCE: Social History   Tobacco Use   Smoking status: Never   Smokeless tobacco: Never   Tobacco comments:    Never, but father and husband smoked  Vaping Use   Vaping Use: Never used  Substance Use Topics   Alcohol use: Not Currently    Alcohol/week: 0.0 standard drinks of alcohol    Comment: on special occasions like New Years Eve   Drug use: Never     Colonoscopy:  PAP:  Bone density:  Lipid panel:  Allergies  Allergen Reactions   Lisinopril     cough   Codeine Nausea Only and Nausea And Vomiting    Current Outpatient Medications  Medication Sig Dispense Refill   alendronate (FOSAMAX) 70 MG tablet TAKE 1 TABLET EVERY 7 DAYS,TAKE WITH A FULL GLASS OF  WATER ON AN EMPTY STOMACH 12 tablet 1   cetirizine (ZYRTEC) 10 MG tablet Take 10 mg by mouth daily. otc     Cholecalciferol (VITAMIN D-3) 125 MCG (5000 UT) TABS Take 1 capsule by mouth daily.     EUTHYROX 88 MCG tablet TAKE 1 TABLET DAILY FIRST  THING IN THE MORNING ON AN EMPTY STOMACH 90 tablet 3   losartan (COZAAR) 100 MG tablet Take 100 mg by mouth daily.     lovastatin (MEVACOR) 20 MG tablet TAKE 1 TABLET AT BEDTIME 90 tablet 3   montelukast (SINGULAIR) 10 MG tablet TAKE 1 TABLET DAILY 90 tablet 3   fluticasone (FLONASE) 50 MCG/ACT nasal spray Place 2 sprays into both nostrils  daily. PRN (Patient not taking: Reported on 05/05/2022) 16 g 2   No current facility-administered medications for this visit.    OBJECTIVE: Vitals:   10/28/22 1035  BP: (!) 134/58  Pulse: 63  Temp: 98.1 F (36.7 C)  SpO2: 100%      Body mass index is 28.84 kg/m.    ECOG FS:0 - Asymptomatic  Physical Exam Constitutional:      Appearance: Normal appearance.  HENT:     Head: Normocephalic and atraumatic.  Cardiovascular:     Rate and Rhythm: Normal rate and regular rhythm.     Pulses: Normal pulses.     Heart sounds: Normal heart sounds.  Pulmonary:     Effort: Pulmonary effort is normal.     Breath  sounds: Normal breath sounds.  Abdominal:     Palpations: Abdomen is soft.  Musculoskeletal:     Cervical back: Normal range of motion and neck supple.  Lymphadenopathy:     Cervical: No cervical adenopathy.  Neurological:     General: No focal deficit present.     Mental Status: She is alert and oriented to person, place, and time.     LAB RESULTS:  Lab Results  Component Value Date   NA 141 09/13/2021   K 3.9 09/13/2021   CL 108 09/13/2021   CO2 26 09/13/2021   GLUCOSE 100 (H) 09/13/2021   BUN 14 09/13/2021   CREATININE 1.24 (H) 09/13/2021   CALCIUM 8.9 09/13/2021   PROT 7.0 09/13/2021   ALBUMIN 3.5 04/06/2017   AST 14 09/13/2021   ALT 8 09/13/2021   ALKPHOS 58 04/06/2017   BILITOT 0.9 09/13/2021   GFRNONAA 43 (L) 08/20/2020   GFRAA 50 (L) 08/20/2020    Lab Results  Component Value Date   WBC 8.1 10/28/2022   NEUTROABS 6.0 10/28/2022   HGB 10.1 (L) 10/28/2022   HCT 31.5 (L) 10/28/2022   MCV 99.4 10/28/2022   PLT 138 (L) 10/28/2022     STUDIES: No results found.  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 Last Retacrit was in Dec 2023 Labs from today show a hemoglobin of 10.1.  No retacrit today RTC in 8 weeks for APP, labs +_ retacrit    Chronic renal insufficiency- Stage IIIa. Followed by nephrology.  Rectal bleeding- Secondary to hemorrhoids and diverticuli per GI and recent colonoscopy. Stable. Followed by Dr. Tobi Bastos.  Disposition- No Retacrit needed today RTC 2 months APP, labs ( CBC w/, CMP, iron, ferritin) +- Retacrit -[place]  I spent 15 minutes dedicated to the care of this patient (face-to-face and non-face-to-face) on the date of the encounter to include what is described in the assessment and plan.  Patient expressed understanding and was in agreement with this plan. She also understands that She can call clinic at any time with any questions, concerns, or complaints.    Rushie Chestnut, PA-C   10/28/2022 11:14 AM

## 2022-11-28 DIAGNOSIS — N1832 Chronic kidney disease, stage 3b: Secondary | ICD-10-CM | POA: Diagnosis not present

## 2022-12-01 DIAGNOSIS — D631 Anemia in chronic kidney disease: Secondary | ICD-10-CM | POA: Diagnosis not present

## 2022-12-01 DIAGNOSIS — I10 Essential (primary) hypertension: Secondary | ICD-10-CM | POA: Diagnosis not present

## 2022-12-01 DIAGNOSIS — N1832 Chronic kidney disease, stage 3b: Secondary | ICD-10-CM | POA: Diagnosis not present

## 2022-12-01 DIAGNOSIS — N2581 Secondary hyperparathyroidism of renal origin: Secondary | ICD-10-CM | POA: Diagnosis not present

## 2022-12-09 DIAGNOSIS — L298 Other pruritus: Secondary | ICD-10-CM | POA: Diagnosis not present

## 2022-12-09 DIAGNOSIS — D2272 Melanocytic nevi of left lower limb, including hip: Secondary | ICD-10-CM | POA: Diagnosis not present

## 2022-12-09 DIAGNOSIS — D225 Melanocytic nevi of trunk: Secondary | ICD-10-CM | POA: Diagnosis not present

## 2022-12-09 DIAGNOSIS — D2262 Melanocytic nevi of left upper limb, including shoulder: Secondary | ICD-10-CM | POA: Diagnosis not present

## 2022-12-09 DIAGNOSIS — D2271 Melanocytic nevi of right lower limb, including hip: Secondary | ICD-10-CM | POA: Diagnosis not present

## 2022-12-09 DIAGNOSIS — L820 Inflamed seborrheic keratosis: Secondary | ICD-10-CM | POA: Diagnosis not present

## 2022-12-09 DIAGNOSIS — L821 Other seborrheic keratosis: Secondary | ICD-10-CM | POA: Diagnosis not present

## 2022-12-09 DIAGNOSIS — D2261 Melanocytic nevi of right upper limb, including shoulder: Secondary | ICD-10-CM | POA: Diagnosis not present

## 2022-12-09 DIAGNOSIS — L538 Other specified erythematous conditions: Secondary | ICD-10-CM | POA: Diagnosis not present

## 2022-12-13 ENCOUNTER — Other Ambulatory Visit: Payer: Self-pay | Admitting: Family Medicine

## 2022-12-15 NOTE — Telephone Encounter (Signed)
Tried calling pt to let them know they need an appt for the reill request received

## 2022-12-21 ENCOUNTER — Encounter: Payer: Self-pay | Admitting: Family Medicine

## 2022-12-21 ENCOUNTER — Ambulatory Visit (INDEPENDENT_AMBULATORY_CARE_PROVIDER_SITE_OTHER): Payer: Medicare HMO | Admitting: Family Medicine

## 2022-12-21 VITALS — BP 130/72 | HR 75 | Temp 98.4°F | Resp 16 | Ht 62.0 in | Wt 156.7 lb

## 2022-12-21 DIAGNOSIS — E782 Mixed hyperlipidemia: Secondary | ICD-10-CM

## 2022-12-21 DIAGNOSIS — E041 Nontoxic single thyroid nodule: Secondary | ICD-10-CM | POA: Diagnosis not present

## 2022-12-21 DIAGNOSIS — N1832 Chronic kidney disease, stage 3b: Secondary | ICD-10-CM | POA: Diagnosis not present

## 2022-12-21 DIAGNOSIS — Z1231 Encounter for screening mammogram for malignant neoplasm of breast: Secondary | ICD-10-CM | POA: Diagnosis not present

## 2022-12-21 DIAGNOSIS — E039 Hypothyroidism, unspecified: Secondary | ICD-10-CM | POA: Diagnosis not present

## 2022-12-21 DIAGNOSIS — M810 Age-related osteoporosis without current pathological fracture: Secondary | ICD-10-CM

## 2022-12-21 DIAGNOSIS — I10 Essential (primary) hypertension: Secondary | ICD-10-CM

## 2022-12-21 DIAGNOSIS — D696 Thrombocytopenia, unspecified: Secondary | ICD-10-CM | POA: Diagnosis not present

## 2022-12-21 DIAGNOSIS — M816 Localized osteoporosis [Lequesne]: Secondary | ICD-10-CM | POA: Diagnosis not present

## 2022-12-21 DIAGNOSIS — F320 Major depressive disorder, single episode, mild: Secondary | ICD-10-CM

## 2022-12-21 MED ORDER — ALENDRONATE SODIUM 70 MG PO TABS: ORAL_TABLET | ORAL | 1 refills | Status: DC

## 2022-12-21 NOTE — Patient Instructions (Addendum)
Health Maintenance  Topic Date Due   Flu Shot  12/15/2022   COVID-19 Vaccine (5 - 2023-24 season) 01/06/2023*   Zoster (Shingles) Vaccine (1 of 2) 03/23/2023*   Medicare Annual Wellness Visit  04/20/2023   Mammogram  04/28/2023   DEXA scan (bone density measurement)  04/27/2024   Colon Cancer Screening  11/05/2026   Pneumonia Vaccine  Completed   Hepatitis C Screening  Completed   HPV Vaccine  Aged Out   DTaP/Tdap/Td vaccine  Discontinued  *Topic was postponed. The date shown is not the original due date.   You may want to decrease your vit D to 1000 to 2000 unit dose daily - 5000 can be too high to take daily  For cramps/muscle spasms you can push electrolytes in your daily fluid intake and supplement magnesium ~400 mg a day is usually tolerated by everyone and can help prevent cramps

## 2022-12-21 NOTE — Progress Notes (Unsigned)
Name: Pamela Hendrix   MRN: [id number]    DOB: [birth year]   Date:12/21/2022       Progress Note  Chief Complaint  Patient presents with   Follow-up   Hyperlipidemia   Hypertension     Subjective:   Pamela Hendrix is a 77 y.o. female, presents to clinic for   Hyperlipidemia: Currently treated with lovastatin, pt reports good med compliance Last Lipids: Lab Results  Component Value Date   CHOL 128 09/13/2021   HDL 55 09/13/2021   LDLCALC 55 09/13/2021   TRIG 94 09/13/2021   CHOLHDL 2.3 09/13/2021   - Denies: Chest pain, shortness of breath, myalgias, claudication  Osteoporosis on fosamax for about 2 years- needs refill Supplementing with vit d 5000 IU Last vitamin D Lab Results  Component Value Date   VD25OH 33 05/06/2019    Hypothyroidism: Current Medication Regimen: synthroid Takes medicine correctly Current Symptoms: denies fatigue, weight changes, heat/cold intolerance, bowel/skin changes or CVS symptoms Most recent results are below; we will be repeating labs today. Lab Results  Component Value Date   TSH 0.94 09/13/2021       Current Outpatient Medications:    alendronate (FOSAMAX) 70 MG tablet, TAKE 1 TABLET EVERY 7 DAYS,TAKE WITH A FULL GLASS OF  WATER ON AN EMPTY STOMACH (Patient taking differently: TAKE 1 TABLET EVERY 7 DAYS,TAKE WITH A FULL GLASS OF  WATER ON AN EMPTY STOMACH), Disp: 12 tablet, Rfl: 1   cetirizine (ZYRTEC) 10 MG tablet, Take 10 mg by mouth daily. otc, Disp: , Rfl:    Cholecalciferol (VITAMIN D-3) 125 MCG (5000 UT) TABS, Take 1 capsule by mouth daily., Disp: , Rfl:    EUTHYROX 88 MCG tablet, TAKE 1 TABLET DAILY FIRST  THING IN THE MORNING ON AN EMPTY STOMACH, Disp: 90 tablet, Rfl: 3   FIBER PO, Take by mouth., Disp: , Rfl:    losartan (COZAAR) 100 MG tablet, Take 100 mg by mouth daily., Disp: , Rfl:    lovastatin (MEVACOR) 20 MG tablet, TAKE 1 TABLET AT BEDTIME, Disp: 90 tablet, Rfl: 3   montelukast  (SINGULAIR) 10 MG tablet, TAKE 1 TABLET DAILY, Disp: 90 tablet, Rfl: 3   fluticasone (FLONASE) 50 MCG/ACT nasal spray, Place 2 sprays into both nostrils daily. PRN (Patient not taking: Reported on 05/05/2022), Disp: 16 g, Rfl: 2  Patient Active Problem List   Diagnosis Date Noted   Anemia of chronic renal failure 12/30/2021   Esophageal dysphagia    Age-related osteoporosis without current pathological fracture 09/09/2021   Anemia, chronic renal failure 09/03/2019   Vitamin D deficiency 09/03/2019   Migraine aura without headache 01/01/2019   Decreased GFR 01/01/2019   CKD (chronic kidney disease) stage 3, GFR 30-59 ml/min (HCC) 12/04/2018   PFO (patent foramen ovale) 07/31/2018   Abnormal ECG 07/13/2018   Bradycardia 07/13/2018   Primary osteoarthritis of left knee 11/20/2017   History of GI diverticular bleed 05/18/2017   Vitamin B12 deficiency anemia 05/18/2017   Chronic allergic rhinitis 06/28/2016   Mixed hyperlipidemia 06/28/2016   Hiatal hernia 08/28/2014   Hypothyroid 08/28/2014   H/O TIA (transient ischemic attack) and stroke 08/28/2014   Headache 10/25/2013   Hypertension 10/25/2013   Numbness and tingling 10/25/2013   Obesity 10/25/2013   Visual disturbance 10/25/2013   Thyroid nodule 09/19/2013   Onychomycosis due to dermatophyte 08/21/2013    Past Surgical History:  Procedure Laterality Date   CHOLECYSTECTOMY     COLONOSCOPY  06/02/2014  cleared for 10 yrs- Dr Shelle Iron   COLONOSCOPY N/A 04/07/2017   Procedure: COLONOSCOPY;  Surgeon: Toney Reil, MD;  Location: [facility] ENDOSCOPY;  Service: Gastroenterology;  Laterality: N/A;   COLONOSCOPY WITH PROPOFOL N/A 11/04/2021   Procedure: COLONOSCOPY WITH PROPOFOL;  Surgeon: Wyline Mood, MD;  Location: [facility] ENDOSCOPY;  Service: Gastroenterology;  Laterality: N/A;   ESOPHAGEAL DILATION     ESOPHAGEAL DILATION  10/21/2021   Procedure: ESOPHAGEAL DILATION;  Surgeon: Midge Minium, MD;  Location: [facility]ERY [facility];   Service: Endoscopy;;  12-18 mm   ESOPHAGOGASTRODUODENOSCOPY N/A 04/07/2017   Procedure: ESOPHAGOGASTRODUODENOSCOPY (EGD);  Surgeon: Toney Reil, MD;  Location[facility] ENDOSCOPY;  Service: Gastroenterology;  Laterality: N/A;   ESOPHAGOGASTRODUODENOSCOPY N/A 10/21/2021   Procedure: ESOPHAGOGASTRODUODENOSCOPY (EGD);  Surgeon: Midge Minium, MD;  Location: [facility] SURGERY [facility];  Service: Endoscopy;  Laterality: N/A;   HERNIA REPAIR  08/28/2014   estimate   TONSILLECTOMY     TUBAL LIGATION  1975   estimate   UPPER GI ENDOSCOPY  06/02/2014    Family History  Problem Relation Age of Onset   Kidney disease Mother    Cancer Mother        brain   Arthritis Mother    Obesity Mother    Varicose Veins Mother    Heart disease Father    Heart attack Father    Hearing loss Father    Hypertension Father    Lupus Sister    Diabetes Sister    Thyroid disease Sister    Rheum arthritis Sister    Arthritis Sister    Obesity Sister    Diabetes Sister    Arthritis Sister    Cancer Sister    Hearing loss Sister    Intellectual disability Sister    Learning disabilities Sister    Multiple myeloma Sister    Thyroid disease Sister    Rheum arthritis Sister    Arthritis Sister    Cancer Sister    Obesity Sister    Breast cancer Maternal Aunt    Miscarriages / [place] Daughter     Social History   Tobacco Use   Smoking status: Never   Smokeless tobacco: Never   Tobacco comments:    Never, but father and husband smoked  Vaping Use   Vaping status: Never Used  Substance Use Topics   Alcohol use: Not Currently    Alcohol/week: 0.0 standard drinks of alcohol    Comment: on special occasions like New Years Eve   Drug use: Never     Allergies  Allergen Reactions   Lisinopril     cough   Codeine Nausea Only and Nausea And Vomiting    Health Maintenance  Topic Date Due   INFLUENZA VACCINE  12/15/2022   COVID-19 Vaccine (5 - 2023-24 season) 01/06/2023 (Originally 01/14/2022)    Zoster Vaccines- Shingrix (1 of 2) 03/23/2023 (Originally 09/11/1995)   Medicare Annual Wellness (AWV)  04/20/2023   MAMMOGRAM  04/28/2023   DEXA SCAN  04/27/2024   Colonoscopy  11/05/2026   Pneumonia Vaccine 57+ Years old  Completed   Hepatitis C Screening  Completed   HPV VACCINES  Aged Out   DTaP/Tdap/Td  Discontinued    Chart Review Today: I personally reviewed active problem list, medication list, allergies, family history, social history, health maintenance, notes from last encounter, lab results, imaging with the patient/caregiver today.   Review of Systems  Constitutional: Negative.   HENT: Negative.    Eyes: Negative.   Respiratory: Negative.  Cardiovascular: Negative.   Gastrointestinal: Negative.   Endocrine: Negative.   Genitourinary: Negative.   Musculoskeletal: Negative.   Skin: Negative.   Allergic/Immunologic: Negative.   Neurological: Negative.   Hematological: Negative.   Psychiatric/Behavioral: Negative.    All other systems reviewed and are negative.    Objective:   Vitals:   12/21/22 1416  BP: 130/72  Pulse: 75  Resp: 16  Temp: 98.4 F (36.9 C)  TempSrc: Oral  SpO2: 98%  Weight: 156 lb 11.2 oz (71.1 kg)  Height: 5\' 2"  (1.575 m)    Body mass index is 28.66 kg/m.  Physical Exam Vitals and nursing note reviewed.  Constitutional:      Appearance: She is well-developed.  HENT:     Head: Normocephalic and atraumatic.     Nose: Nose normal.  Eyes:     General:        Right eye: No discharge.        Left eye: No discharge.     Conjunctiva/sclera: Conjunctivae normal.  Neck:     Trachea: No tracheal deviation.  Cardiovascular:     Rate and Rhythm: Normal rate and regular rhythm.  Pulmonary:     Effort: Pulmonary effort is normal. No respiratory distress.     Breath sounds: No stridor.  Musculoskeletal:        General: Normal range of motion.  Skin:    General: Skin is warm and dry.     Findings: No rash.  Neurological:      Mental Status: She is alert.     Motor: No abnormal muscle tone.     Coordination: Coordination normal.  Psychiatric:        Behavior: Behavior normal.         Assessment & Plan:   1. Primary hypertension On losartan from nephrology Losartan 100 BP at goal today BP Readings from Last 3 Encounters:  12/21/22 130/72  10/28/22 (!) 134/58  05/05/22 135/66  - COMPLETE METABOLIC PANEL WITH GFR  2. Mixed hyperlipidemia On lovastatin 20 - intolerance to other statins or doses Due for lipids - COMPLETE METABOLIC PANEL WITH GFR - Lipid panel  3. Hypothyroidism, unspecified type On levothyroscine 88 mcg No overly concerning sx, will check labs and adjust meds as needed - TSH  4. Thyroid nodule - COMPLETE METABOLIC PANEL WITH GFR - TSH  5. Localized osteoporosis without current pathological fracture On meds for 2 years, will check labs - Vit D, calcium and she would be do for Dexa Dec 2025 - alendronate (FOSAMAX) 70 MG tablet; TAKE 1 TABLET EVERY 7 DAYS,TAKE WITH A FULL GLASS OF  WATER ON AN EMPTY STOMACH  Dispense: 12 tablet; Refill: 1  6. Age-related osteoporosis without current pathological fracture See above - COMPLETE METABOLIC PANEL WITH GFR - VITAMIN D 25 Hydroxy (Vit-D Deficiency, Fractures) - alendronate (FOSAMAX) 70 MG tablet; TAKE 1 TABLET EVERY 7 DAYS,TAKE WITH A FULL GLASS OF  WATER ON AN EMPTY STOMACH  Dispense: 12 tablet; Refill: 1  7. Encounter for screening mammogram for malignant neoplasm of breast Due Dec 2024 - MM 3D SCREENING MAMMOGRAM BILATERAL BREAST; Future  8. Current mild episode of major depressive disorder, unspecified whether recurrent (HCC) Phq positive    12/21/2022    2:15 PM 04/19/2022   10:13 AM 09/15/2021    8:18 AM  Depression screen PHQ 2/9  Decreased Interest 1 1 1   Down, Depressed, Hopeless 1 1 1   PHQ - 2 Score 2 2 2   Altered sleeping  3 1 0  Tired, decreased energy 3 1 1   Change in appetite 0 1 1  Feeling bad or failure about  yourself  1 0 1  Trouble concentrating 0 3 1  Moving slowly or fidgety/restless 0 0 0  Suicidal thoughts 0 0 0  PHQ-9 Score 9 8 6   Difficult doing work/chores Somewhat difficult Somewhat difficult Not difficult at all  Will be checking tsh  9. Thrombocytopenia (HCC) Chronic she manages with hem/onc  10. Stage 3b chronic kidney disease (HCC) Checking Vit D, managed by nephrology Labs reviewed through care everywhere     Return in about 6 months (around 06/23/2023) for Annual Physical, Medicare Well Visit.   Danelle Berry, PA-C 12/21/22 2:43 PM

## 2022-12-22 ENCOUNTER — Other Ambulatory Visit: Payer: Self-pay | Admitting: Family Medicine

## 2022-12-22 DIAGNOSIS — F320 Major depressive disorder, single episode, mild: Secondary | ICD-10-CM | POA: Insufficient documentation

## 2022-12-22 DIAGNOSIS — D696 Thrombocytopenia, unspecified: Secondary | ICD-10-CM | POA: Insufficient documentation

## 2022-12-29 ENCOUNTER — Other Ambulatory Visit: Payer: Self-pay | Admitting: *Deleted

## 2022-12-29 DIAGNOSIS — N189 Chronic kidney disease, unspecified: Secondary | ICD-10-CM

## 2022-12-30 ENCOUNTER — Inpatient Hospital Stay: Payer: Medicare HMO | Attending: Oncology

## 2022-12-30 ENCOUNTER — Inpatient Hospital Stay: Payer: Medicare HMO | Admitting: Oncology

## 2022-12-30 ENCOUNTER — Inpatient Hospital Stay: Payer: Medicare HMO

## 2022-12-30 ENCOUNTER — Encounter: Payer: Self-pay | Admitting: Oncology

## 2022-12-30 VITALS — BP 109/57 | HR 60 | Temp 98.5°F | Resp 16 | Ht 62.0 in | Wt 156.0 lb

## 2022-12-30 DIAGNOSIS — N189 Chronic kidney disease, unspecified: Secondary | ICD-10-CM | POA: Diagnosis not present

## 2022-12-30 DIAGNOSIS — D631 Anemia in chronic kidney disease: Secondary | ICD-10-CM

## 2022-12-30 DIAGNOSIS — N1831 Chronic kidney disease, stage 3a: Secondary | ICD-10-CM | POA: Insufficient documentation

## 2022-12-30 LAB — CMP (CANCER CENTER ONLY)
ALT: 8 U/L (ref 0–44)
AST: 16 U/L (ref 15–41)
Albumin: 3.6 g/dL (ref 3.5–5.0)
Alkaline Phosphatase: 47 U/L (ref 38–126)
Anion gap: 6 (ref 5–15)
BUN: 18 mg/dL (ref 8–23)
CO2: 25 mmol/L (ref 22–32)
Calcium: 8.4 mg/dL — ABNORMAL LOW (ref 8.9–10.3)
Chloride: 105 mmol/L (ref 98–111)
Creatinine: 1.26 mg/dL — ABNORMAL HIGH (ref 0.44–1.00)
GFR, Estimated: 44 mL/min — ABNORMAL LOW (ref >60)
Glucose, Bld: 129 mg/dL — ABNORMAL HIGH (ref 70–99)
Potassium: 4.2 mmol/L (ref 3.5–5.1)
Sodium: 136 mmol/L (ref 135–145)
Total Bilirubin: 0.9 mg/dL (ref 0.3–1.2)
Total Protein: 7 g/dL (ref 6.5–8.1)

## 2022-12-30 LAB — CBC WITH DIFFERENTIAL/PLATELET
Abs Immature Granulocytes: 0.03 10*3/uL (ref 0.00–0.07)
Basophils Absolute: 0 10*3/uL (ref 0.0–0.1)
Basophils Relative: 1 %
Eosinophils Absolute: 0 10*3/uL (ref 0.0–0.5)
Eosinophils Relative: 1 %
HCT: 30.3 % — ABNORMAL LOW (ref 36.0–46.0)
Hemoglobin: 9.8 g/dL — ABNORMAL LOW (ref 12.0–15.0)
Immature Granulocytes: 1 %
Lymphocytes Relative: 17 %
Lymphs Abs: 1 10*3/uL (ref 0.7–4.0)
MCH: 32.6 pg (ref 26.0–34.0)
MCHC: 32.3 g/dL (ref 30.0–36.0)
MCV: 100.7 fL — ABNORMAL HIGH (ref 80.0–100.0)
Monocytes Absolute: 0.5 10*3/uL (ref 0.1–1.0)
Monocytes Relative: 7 %
Neutro Abs: 4.6 10*3/uL (ref 1.7–7.7)
Neutrophils Relative %: 73 %
Platelets: 141 10*3/uL — ABNORMAL LOW (ref 150–400)
RBC: 3.01 MIL/uL — ABNORMAL LOW (ref 3.87–5.11)
RDW: 14.6 % (ref 11.5–15.5)
WBC: 6.2 10*3/uL (ref 4.0–10.5)
nRBC: 0.3 % — ABNORMAL HIGH (ref 0.0–0.2)

## 2022-12-30 LAB — IRON AND TIBC
Iron: 68 ug/dL (ref 28–170)
Saturation Ratios: 18 % (ref 10.4–31.8)
TIBC: 389 ug/dL (ref 250–450)
UIBC: 321 ug/dL

## 2022-12-30 LAB — FERRITIN: Ferritin: 22 ng/mL (ref 11–307)

## 2022-12-30 MED ORDER — EPOETIN ALFA 40000 UNIT/ML IJ SOLN
40000.0000 [IU] | Freq: Once | INTRAMUSCULAR | Status: AC
  Administered 2022-12-30: 40000 [IU] via SUBCUTANEOUS
  Filled 2022-12-30: qty 1

## 2022-12-30 NOTE — Progress Notes (Signed)
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  INTERVAL HISTORY: Patient returns to clinic today for repeat laboratory work, further evaluation, and consideration of additional Retacrit.  She has occasional cramping in her lower extremities, but otherwise feels well. She has no neurologic complaints.  She denies any recent fevers or illnesses.  She has a good appetite and denies weight loss.  She has no chest pain, shortness of breath, cough, or hemoptysis.  She denies any nausea, vomiting, constipation, or diarrhea.  She has no melena or hematochezia.  She has no urinary complaints.  Patient no further specific complaints today.  REVIEW OF SYSTEMS:   Review of Systems  Constitutional: Negative.  Negative for fever, malaise/fatigue and weight loss.  Respiratory: Negative.  Negative for cough, hemoptysis and shortness of breath.   Cardiovascular: Negative.  Negative for chest pain and leg swelling.  Gastrointestinal: Negative.  Negative for abdominal pain, blood in stool and melena.  Genitourinary: Negative.  Negative for hematuria.  Musculoskeletal: Negative.  Negative for back pain.  Skin: Negative.  Negative for rash.  Neurological: Negative.  Negative for dizziness, focal weakness, weakness and headaches.  Psychiatric/Behavioral:  The patient is not nervous/anxious.     As per HPI. Otherwise, a complete review of systems is negative.  PAST MEDICAL HISTORY: Past Medical History:  Diagnosis Date   Age-related osteoporosis without current pathological fracture 09/09/2021    Allergy    Anemia 2000   estimate   Anemia    Blood transfusion without reported diagnosis 04/07/2017   estimate   Bradycardia    Bursitis    CKD (chronic kidney disease) stage 3, GFR 30-59 ml/min (HCC)    Diverticulosis    GERD (gastroesophageal reflux disease)    Hiatal hernia    Hypertension    Hypothyroidism    Migraine with aura    states she doesn't have a headache, just the aura   Mixed hyperlipidemia    Osteoarthritis of knee    Thyroid disease     PAST SURGICAL HISTORY: Past Surgical History:  Procedure Laterality Date   CHOLECYSTECTOMY     COLONOSCOPY  06/02/2014   cleared for 10 yrs- Dr Shelle Iron   COLONOSCOPY N/A 04/07/2017   Procedure: COLONOSCOPY;  Surgeon: Toney Reil, MD;  Location: [facility] ENDOSCOPY;  Service: Gastroenterology;  Laterality: N/A;   COLONOSCOPY WITH PROPOFOL N/A 11/04/2021   Procedure: COLONOSCOPY WITH PROPOFOL;  Surgeon: Wyline Mood, MD;  Location: [facility] ENDOSCOPY;  Service: Gastroenterology;  Laterality: N/A;   ESOPHAGEAL DILATION     ESOPHAGEAL DILATION  10/21/2021   Procedure: ESOPHAGEAL DILATION;  Surgeon: Midge Minium, MD;  Location: [facility];  Service: Endoscopy;;  12-18 mm   ESOPHAGOGASTRODUODENOSCOPY N/A 04/07/2017   Procedure: ESOPHAGOGASTRODUODENOSCOPY (EGD);  Surgeon: Toney Reil, MD;  Location: [facility] ENDOSCOPY;  Service: Gastroenterology;  Laterality: N/A;   ESOPHAGOGASTRODUODENOSCOPY N/A 10/21/2021   Procedure: ESOPHAGOGASTRODUODENOSCOPY (EGD);  Surgeon: Midge Minium, MD;  Location: [facility];  Service: Endoscopy;  Laterality: N/A;   HERNIA REPAIR  08/28/2014   estimate   TONSILLECTOMY     TUBAL LIGATION  1975   estimate   UPPER GI ENDOSCOPY  06/02/2014  FAMILY HISTORY: Family History  Problem Relation Age of Onset   Kidney disease Mother    Cancer Mother        brain   Arthritis Mother    Obesity Mother    Varicose Veins Mother    Heart disease Father    Heart attack Father    Hearing loss  Father    Hypertension Father    Lupus Sister    Diabetes Sister    Thyroid disease Sister    Rheum arthritis Sister    Arthritis Sister    Obesity Sister    Diabetes Sister    Arthritis Sister    Cancer Sister    Hearing loss Sister    Intellectual disability Sister    Learning disabilities Sister    Multiple myeloma Sister    Thyroid disease Sister    Rheum arthritis Sister    Arthritis Sister    Cancer Sister    Obesity Sister    Breast cancer Maternal Aunt    Miscarriages / [place] Daughter     ADVANCED DIRECTIVES (Y/N):  N  HEALTH MAINTENANCE: Social History   Tobacco Use   Smoking status: Never   Smokeless tobacco: Never   Tobacco comments:    Never, but father and husband smoked  Vaping Use   Vaping status: Never Used  Substance Use Topics   Alcohol use: Not Currently    Alcohol/week: 0.0 standard drinks of alcohol    Comment: on special occasions like New Years Eve   Drug use: Never     Colonoscopy:  PAP:  Bone density:  Lipid panel:  Allergies  Allergen Reactions   Lisinopril     cough   Codeine Nausea Only and Nausea And Vomiting    Current Outpatient Medications  Medication Sig Dispense Refill   alendronate (FOSAMAX) 70 MG tablet TAKE 1 TABLET EVERY 7 DAYS,TAKE WITH A FULL GLASS OF  WATER ON AN EMPTY STOMACH 12 tablet 1   cetirizine (ZYRTEC) 10 MG tablet Take 10 mg by mouth daily. otc     Cholecalciferol (VITAMIN D-3) 125 MCG (5000 UT) TABS Take 1 capsule by mouth daily.     EUTHYROX 88 MCG tablet TAKE 1 TABLET DAILY FIRST  THING IN THE MORNING ON AN EMPTY STOMACH 90 tablet 3   FIBER PO Take by mouth.     losartan (COZAAR) 100 MG tablet Take 100 mg by mouth daily.     lovastatin (MEVACOR) 20 MG tablet TAKE 1 TABLET AT BEDTIME 90 tablet 3   montelukast (SINGULAIR) 10 MG tablet TAKE 1 TABLET DAILY 90 tablet 3   No current facility-administered medications for this visit.    OBJECTIVE: Vitals:   12/30/22 1023  BP: (!) 109/57  Pulse:  60  Resp: 16  Temp: 98.5 F (36.9 C)  SpO2: 100%     Body mass index is 28.53 kg/m.    ECOG FS:0 - Asymptomatic  General: Well-developed, well-nourished, no acute distress. Eyes: Pink conjunctiva, anicteric sclera. HEENT: Normocephalic, moist mucous membranes. Lungs: No audible wheezing or coughing. Heart: Regular rate and rhythm. Abdomen: Soft, nontender, no obvious distention. Musculoskeletal: No edema, cyanosis, or clubbing. Neuro: Alert, answering all questions appropriately. Cranial nerves grossly intact. Skin: No rashes or petechiae noted. Psych: Normal affect.  LAB RESULTS:  Lab Results  Component Value Date   NA 136 12/30/2022   K 4.2 12/30/2022   CL 105 12/30/2022   CO2 25 12/30/2022   GLUCOSE 129 (H) 12/30/2022   BUN  18 12/30/2022   CREATININE 1.26 (H) 12/30/2022   CALCIUM 8.4 (L) 12/30/2022   PROT 7.0 12/30/2022   ALBUMIN 3.6 12/30/2022   AST 16 12/30/2022   ALT 8 12/30/2022   ALKPHOS 47 12/30/2022   BILITOT 0.9 12/30/2022   GFRNONAA 44 (L) 12/30/2022   GFRAA 50 (L) 08/20/2020    Lab Results  Component Value Date   WBC 6.2 12/30/2022   NEUTROABS 4.6 12/30/2022   HGB 9.8 (L) 12/30/2022   HCT 30.3 (L) 12/30/2022   MCV 100.7 (H) 12/30/2022   PLT 141 (L) 12/30/2022   Lab Results  Component Value Date   IRON 68 12/30/2022   TIBC 389 12/30/2022   IRONPCTSAT 18 12/30/2022   Lab Results  Component Value Date   FERRITIN 22 12/30/2022     STUDIES: No results found.  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  Previously all of her other laboratory work including iron stores were either negative or within normal limits.  It appears patient only requires treatment approximately every 6 months.  Proceed with 40,000 units Retacrit today.  Return to clinic in 6 months with repeat laboratory work, further evaluation, and continuation of treatment if  needed.   Chronic renal insufficiency: Stage IIIa.  Chronic and unchanged.  Continue follow-up with nephrology as indicated.  I spent a total of 20 minutes reviewing chart data, face-to-face evaluation with the patient, counseling and coordination of care as detailed above.   Patient expressed understanding and was in agreement with this plan. She also understands that She can call clinic at any time with any questions, concerns, or complaints.    Jeralyn Ruths, MD   12/30/2022 12:32 PM

## 2023-01-19 DIAGNOSIS — N1831 Chronic kidney disease, stage 3a: Secondary | ICD-10-CM | POA: Diagnosis not present

## 2023-01-19 DIAGNOSIS — M7989 Other specified soft tissue disorders: Secondary | ICD-10-CM | POA: Diagnosis not present

## 2023-01-19 DIAGNOSIS — Q2112 Patent foramen ovale: Secondary | ICD-10-CM | POA: Diagnosis not present

## 2023-01-19 DIAGNOSIS — I10 Essential (primary) hypertension: Secondary | ICD-10-CM | POA: Diagnosis not present

## 2023-01-19 DIAGNOSIS — I340 Nonrheumatic mitral (valve) insufficiency: Secondary | ICD-10-CM | POA: Diagnosis not present

## 2023-01-19 DIAGNOSIS — R001 Bradycardia, unspecified: Secondary | ICD-10-CM | POA: Diagnosis not present

## 2023-01-19 DIAGNOSIS — E782 Mixed hyperlipidemia: Secondary | ICD-10-CM | POA: Diagnosis not present

## 2023-01-19 DIAGNOSIS — M79662 Pain in left lower leg: Secondary | ICD-10-CM | POA: Diagnosis not present

## 2023-01-24 ENCOUNTER — Encounter: Payer: Self-pay | Admitting: Student

## 2023-01-25 ENCOUNTER — Other Ambulatory Visit: Payer: Self-pay | Admitting: Student

## 2023-01-25 ENCOUNTER — Encounter: Payer: Self-pay | Admitting: Student

## 2023-01-25 DIAGNOSIS — M79662 Pain in left lower leg: Secondary | ICD-10-CM

## 2023-01-26 DIAGNOSIS — I340 Nonrheumatic mitral (valve) insufficiency: Secondary | ICD-10-CM | POA: Diagnosis not present

## 2023-01-26 DIAGNOSIS — Q2112 Patent foramen ovale: Secondary | ICD-10-CM | POA: Diagnosis not present

## 2023-01-30 ENCOUNTER — Ambulatory Visit
Admission: RE | Admit: 2023-01-30 | Discharge: 2023-01-30 | Disposition: A | Discharge status: Home / Self Care | Payer: Medicare HMO | Source: Ambulatory Visit | Attending: Student | Admitting: Student

## 2023-01-30 DIAGNOSIS — M7989 Other specified soft tissue disorders: Secondary | ICD-10-CM | POA: Diagnosis not present

## 2023-01-30 DIAGNOSIS — M79605 Pain in left leg: Secondary | ICD-10-CM | POA: Diagnosis not present

## 2023-01-30 DIAGNOSIS — M79662 Pain in left lower leg: Secondary | ICD-10-CM | POA: Diagnosis not present

## 2023-01-30 DIAGNOSIS — M7122 Synovial cyst of popliteal space [Baker], left knee: Secondary | ICD-10-CM | POA: Diagnosis not present

## 2023-01-30 DIAGNOSIS — R2242 Localized swelling, mass and lump, left lower limb: Secondary | ICD-10-CM | POA: Diagnosis not present

## 2023-03-20 ENCOUNTER — Other Ambulatory Visit: Payer: Self-pay | Admitting: Family Medicine

## 2023-03-20 DIAGNOSIS — M810 Age-related osteoporosis without current pathological fracture: Secondary | ICD-10-CM

## 2023-03-20 DIAGNOSIS — M816 Localized osteoporosis [Lequesne]: Secondary | ICD-10-CM

## 2023-03-20 NOTE — Telephone Encounter (Signed)
Medication Refill - Medication: alendronate (FOSAMAX) 70 MG tablet [[id number]]    Has the patient contacted their pharmacy? Yes.     (Agent: If yes, when and what did the pharmacy advise?) Contact Office, pharmacy has sent requests   Preferred Pharmacy (with phone number or street name): [facility]   Has the patient been seen for an appointment in the last year OR does the patient have an upcoming appointment? Yes.    Agent: Please be advised that RX refills may take up to 3 business days. We ask that you follow-up with your pharmacy.   Pt is requesting an update via MyChart when the medication is sent.

## 2023-03-21 ENCOUNTER — Other Ambulatory Visit: Payer: Self-pay

## 2023-03-21 DIAGNOSIS — M816 Localized osteoporosis [Lequesne]: Secondary | ICD-10-CM

## 2023-03-21 DIAGNOSIS — M810 Age-related osteoporosis without current pathological fracture: Secondary | ICD-10-CM

## 2023-03-21 MED ORDER — ALENDRONATE SODIUM 70 MG PO TABS: ORAL_TABLET | ORAL | 0 refills | Status: DC

## 2023-03-28 DIAGNOSIS — H2513 Age-related nuclear cataract, bilateral: Secondary | ICD-10-CM | POA: Diagnosis not present

## 2023-04-03 DIAGNOSIS — N1832 Chronic kidney disease, stage 3b: Secondary | ICD-10-CM | POA: Diagnosis not present

## 2023-04-06 DIAGNOSIS — N2581 Secondary hyperparathyroidism of renal origin: Secondary | ICD-10-CM | POA: Diagnosis not present

## 2023-04-06 DIAGNOSIS — I10 Essential (primary) hypertension: Secondary | ICD-10-CM | POA: Diagnosis not present

## 2023-04-06 DIAGNOSIS — D631 Anemia in chronic kidney disease: Secondary | ICD-10-CM | POA: Diagnosis not present

## 2023-04-06 DIAGNOSIS — N1832 Chronic kidney disease, stage 3b: Secondary | ICD-10-CM | POA: Diagnosis not present

## 2023-04-10 ENCOUNTER — Encounter: Payer: Self-pay | Admitting: Physician Assistant

## 2023-04-10 ENCOUNTER — Other Ambulatory Visit (HOSPITAL_COMMUNITY)
Admission: RE | Admit: 2023-04-10 | Discharge: 2023-04-10 | Disposition: A | Discharge status: Home / Self Care | Payer: Medicare HMO | Source: Ambulatory Visit | Attending: Physician Assistant | Admitting: Physician Assistant

## 2023-04-10 ENCOUNTER — Ambulatory Visit (INDEPENDENT_AMBULATORY_CARE_PROVIDER_SITE_OTHER): Payer: Medicare HMO | Admitting: Physician Assistant

## 2023-04-10 VITALS — BP 124/78 | HR 72 | Temp 98.1°F | Resp 16 | Ht 62.0 in | Wt 157.7 lb

## 2023-04-10 DIAGNOSIS — Z0000 Encounter for general adult medical examination without abnormal findings: Secondary | ICD-10-CM | POA: Diagnosis not present

## 2023-04-10 DIAGNOSIS — N760 Acute vaginitis: Secondary | ICD-10-CM

## 2023-04-10 DIAGNOSIS — D518 Other vitamin B12 deficiency anemias: Secondary | ICD-10-CM

## 2023-04-10 DIAGNOSIS — L292 Pruritus vulvae: Secondary | ICD-10-CM | POA: Insufficient documentation

## 2023-04-10 DIAGNOSIS — B9689 Other specified bacterial agents as the cause of diseases classified elsewhere: Secondary | ICD-10-CM

## 2023-04-10 NOTE — Progress Notes (Signed)
Annual Physical Exam   Name: Pamela Hendrix   MRN: [id number]    DOB: [birth year]   Date:04/10/2023  Today's Provider: Jacquelin Hawking, MHS, PA-C Introduced myself to the patient as a PA-C and provided education on APPs in clinical practice.         Subjective  Chief Complaint  Chief Complaint  Patient presents with   Annual Exam    HPI  Patient presents for annual CPE.  Diet: She reports she does not eat right- does not eat enough vegetables or fruits and eats a lot of fried foods  Exercise: She is not engaged in regular exercise. Is trying to go to yoga once per week   Sleep: "has not been that good lately, plus I have to get up and go to the bathroom" she reports trouble going to sleep but she sleeps in. On avg getting about 8  hours per night but it is broken up by waking up  Mood: "Sometimes good sometimes bad, I get agitated real easy"  Flowsheet Row Office Visit from 04/10/2023 in [facility]  AUDIT-C Score 1       Depression: Phq 9 is  positive    04/10/2023   10:16 AM 12/21/2022    2:15 PM 04/19/2022   10:13 AM 09/15/2021    8:18 AM 09/09/2021    1:05 PM  Depression screen PHQ 2/9  Decreased Interest 1 1 1 1 1   Down, Depressed, Hopeless 1 1 1 1 1   PHQ - 2 Score 2 2 2 2 2   Altered sleeping 1 3 1  0 0  Tired, decreased energy 1 3 1 1 3   Change in appetite 1 0 1 1 0  Feeling bad or failure about yourself  3 1 0 1 1  Trouble concentrating 1 0 3 1 0  Moving slowly or fidgety/restless 0 0 0 0 0  Suicidal thoughts 0 0 0 0 0  PHQ-9 Score 9 9 8 6 6   Difficult doing work/chores Somewhat difficult Somewhat difficult Somewhat difficult Not difficult at all    Hypertension: BP Readings from Last 3 Encounters:  04/10/23 124/78  12/30/22 (!) 109/57  12/21/22 130/72   Obesity: Wt Readings from Last 3 Encounters:  04/10/23 157 lb 11.2 oz (71.5 kg)  12/30/22 156 lb (70.8 kg)  12/21/22 156 lb 11.2 oz (71.1 kg)   BMI Readings from  Last 3 Encounters:  04/10/23 28.84 kg/m  12/30/22 28.53 kg/m  12/21/22 28.66 kg/m      Health Maintenance  Topic Date Due   Medicare Annual Wellness Visit  04/20/2023   COVID-19 Vaccine (5 - 2023-24 season) 04/26/2023*   Mammogram  04/28/2023   Zoster (Shingles) Vaccine (2 of 2) 05/10/2023   DEXA scan (bone density measurement)  04/27/2024   Colon Cancer Screening  11/05/2026   Pneumonia Vaccine  Completed   Flu Shot  Completed   Hepatitis C Screening  Completed   HPV Vaccine  Aged Out   DTaP/Tdap/Td vaccine  Discontinued  *Topic was postponed. The date shown is not the original due date.      STD testing and prevention (HIV/chl/gon/syphilis): declines today  Intimate partner violence: negative Sexual History: She is not sexually active  Menstrual History/LMP/Abnormal Bleeding: she denies vaginal bleeding.  Discussed importance of follow up if any post-menopausal bleeding: yes Incontinence Symptoms: Yes.    Breast cancer hx:  - Last Mammogram: scheduled in Dec  - BRCA gene screening:  Osteoporosis Prevention : Discussed high calcium and vitamin D supplementation, weight bearing exercises Bone density :yes- UTD   Cervical cancer screening: discontinued   Skin cancer: Discussed monitoring for atypical lesions  Lung cancer:  Low Dose CT Chest recommended if Age 77-80 years, 20 pack-year currently smoking OR have quit w/in 15years. Patient does not qualify.   ECG: NA  Advanced Care Planning: A voluntary discussion about advance care planning including the explanation and discussion of advance directives.  Discussed health care proxy and Living will, and the patient was able to identify a health care proxy as her husband,.Marylyn Ishihara.  Patient does have a living will in effect.  Lipids: Lab Results  Component Value Date   CHOL 119 12/21/2022   CHOL 128 09/13/2021   CHOL 137 03/10/2021   Lab Results  Component Value Date   HDL 62 12/21/2022   HDL 55  09/13/2021   HDL 57 03/10/2021   Lab Results  Component Value Date   LDLCALC 39 12/21/2022   LDLCALC 55 09/13/2021   LDLCALC 62 03/10/2021   Lab Results  Component Value Date   TRIG 95 12/21/2022   TRIG 94 09/13/2021   TRIG 101 03/10/2021   Lab Results  Component Value Date   CHOLHDL 1.9 12/21/2022   CHOLHDL 2.3 09/13/2021   CHOLHDL 2.4 03/10/2021   No results found for: "LDLDIRECT"  Glucose: Glucose  Date Value Ref Range Status  12/05/2013 102 (H) 65 - 99 mg/dL Final  04/54/0981 191 (H) 65 - 99 mg/dL Final   Glucose, Bld  Date Value Ref Range Status  12/30/2022 129 (H) 70 - 99 mg/dL Final    Comment:    Glucose reference range applies only to samples taken after fasting for at least 8 hours.  12/21/2022 89 65 - 99 mg/dL Final    Comment:    .            Fasting reference interval .   09/13/2021 100 (H) 65 - 99 mg/dL Final    Comment:    .            Fasting reference interval . For someone without known diabetes, a glucose value between 100 and 125 mg/dL is consistent with prediabetes and should be confirmed with a follow-up test. .    Glucose-Capillary  Date Value Ref Range Status  04/07/2017 101 (H) 65 - 99 mg/dL Final    Patient Active Problem List   Diagnosis Date Noted   Current mild episode of major depressive disorder, unspecified whether recurrent (HCC) 12/22/2022   Thrombocytopenia (HCC) 12/22/2022   Anemia of chronic renal failure 12/30/2021   Esophageal dysphagia    Age-related osteoporosis without current pathological fracture 09/09/2021   Anemia, chronic renal failure 09/03/2019   Vitamin D deficiency 09/03/2019   Migraine aura without headache 01/01/2019   Decreased GFR 01/01/2019   CKD (chronic kidney disease) stage 3, GFR 30-59 ml/min (HCC) 12/04/2018   PFO (patent foramen ovale) 07/31/2018   Abnormal ECG 07/13/2018   Bradycardia 07/13/2018   Primary osteoarthritis of left knee 11/20/2017   History of GI diverticular bleed  05/18/2017   Vitamin B12 deficiency anemia 05/18/2017   Chronic allergic rhinitis 06/28/2016   Mixed hyperlipidemia 06/28/2016   Hiatal hernia 08/28/2014   Hypothyroid 08/28/2014   H/O TIA (transient ischemic attack) and stroke 08/28/2014   Headache 10/25/2013   Hypertension 10/25/2013   Numbness and tingling 10/25/2013   Obesity 10/25/2013   Visual disturbance 10/25/2013   Thyroid  nodule 09/19/2013   Onychomycosis due to dermatophyte 08/21/2013    Past Surgical History:  Procedure Laterality Date   CHOLECYSTECTOMY     COLONOSCOPY  06/02/2014   cleared for 10 yrs- Dr Shelle Iron   COLONOSCOPY N/A 04/07/2017   Procedure: COLONOSCOPY;  Surgeon: Toney Reil, MD;  Location: [facility] ENDOSCOPY;  Service: Gastroenterology;  Laterality: N/A;   COLONOSCOPY WITH PROPOFOL N/A 11/04/2021   Procedure: COLONOSCOPY WITH PROPOFOL;  Surgeon: Wyline Mood, MD;  Location: [facility] ENDOSCOPY;  Service: Gastroenterology;  Laterality: N/A;   ESOPHAGEAL DILATION     ESOPHAGEAL DILATION  10/21/2021   Procedure: ESOPHAGEAL DILATION;  Surgeon: Midge Minium, MD;  Location: [facility];  Service: Endoscopy;;  12-18 mm   ESOPHAGOGASTRODUODENOSCOPY N/A 04/07/2017   Procedure: ESOPHAGOGASTRODUODENOSCOPY (EGD);  Surgeon: Toney Reil, MD;  Location: [facility] ENDOSCOPY;  Service: Gastroenterology;  Laterality: N/A;   ESOPHAGOGASTRODUODENOSCOPY N/A 10/21/2021   Procedure: ESOPHAGOGASTRODUODENOSCOPY (EGD);  Surgeon: Midge Minium, MD;  Location: [facility];  Service: Endoscopy;  Laterality: N/A;   HERNIA REPAIR  08/28/2014   estimate   TONSILLECTOMY     TUBAL LIGATION  1975   estimate   UPPER GI ENDOSCOPY  06/02/2014    Family History  Problem Relation Age of Onset   Kidney disease Mother    Cancer Mother        brain   Arthritis Mother    Obesity Mother    Varicose Veins Mother    Heart disease Father    Heart attack Father    Hearing loss Father    Hypertension Father    Lupus Sister     Diabetes Sister    Thyroid disease Sister    Rheum arthritis Sister    Arthritis Sister    Obesity Sister    Kidney disease Sister    Diabetes Sister    Arthritis Sister    Cancer Sister    Hearing loss Sister    Intellectual disability Sister    Learning disabilities Sister    Multiple myeloma Sister    Thyroid disease Sister    Rheum arthritis Sister    Arthritis Sister    Cancer Sister    Obesity Sister    Breast cancer Maternal Aunt    Miscarriages / [place] Daughter     Social History   Socioeconomic History   Marital status: Married    Spouse name: Aneta Mins   Number of children: 1   Years of education: some college   Highest education level: 12th grade  Occupational History   Occupation: Retired  Tobacco Use   Smoking status: Never   Smokeless tobacco: Never   Tobacco comments:    Never, but father and husband smoked  Vaping Use   Vaping status: Never Used  Substance and Sexual Activity   Alcohol use: Not Currently    Comment: on special occasions like New Years Eve   Drug use: Never   Sexual activity: Not Currently    Birth control/protection: Post-menopausal, Surgical    Comment: tubes tied in my 30's, now post-menopausal  Other Topics Concern   Not on file  Social History Narrative   Not on file   Social Determinants of Health   Financial Resource Strain: Low Risk  (04/07/2023)   Overall Financial Resource Strain (CARDIA)    Difficulty of Paying Living Expenses: Not hard at all  Food Insecurity: No Food Insecurity (04/07/2023)   Hunger Vital Sign    Worried About Running Out of Food in the  Last Year: Never true    Ran Out of Food in the Last Year: Never true  Transportation Needs: No Transportation Needs (04/07/2023)   PRAPARE - Administrator, Civil Service (Medical): No    Lack of Transportation (Non-Medical): No  Physical Activity: Insufficiently Active (04/07/2023)   Exercise Vital Sign    Days of Exercise per Week: 1 day     Minutes of Exercise per Session: 50 min  Stress: No Stress Concern Present (04/07/2023)   [employer] of Occupational Health - Occupational Stress Questionnaire    Feeling of Stress : Only a little  Social Connections: Socially Integrated (04/07/2023)   Social Connection and Isolation Panel [NHANES]    Frequency of Communication with Friends and Family: More than three times a week    Frequency of Social Gatherings with Friends and Family: Twice a week    Attends Religious Services: More than 4 times per year    Active Member of [employer] or Organizations: Yes    Attends Engineer, structural: More than 4 times per year    Marital Status: Married  Catering manager Violence: Not At Risk (04/10/2023)   Humiliation, Afraid, Rape, and Kick questionnaire    Fear of Current or Ex-Partner: No    Emotionally Abused: No    Physically Abused: No    Sexually Abused: No     Current Outpatient Medications:    alendronate (FOSAMAX) 70 MG tablet, TAKE 1 TABLET EVERY 7 DAYS,TAKE WITH A FULL GLASS OF  WATER ON AN EMPTY STOMACH, Disp: 12 tablet, Rfl: 0   cetirizine (ZYRTEC) 10 MG tablet, Take 10 mg by mouth daily. otc, Disp: , Rfl:    Cholecalciferol (VITAMIN D-3) 125 MCG (5000 UT) TABS, Take 1 capsule by mouth daily., Disp: , Rfl:    EUTHYROX 88 MCG tablet, TAKE 1 TABLET DAILY FIRST  THING IN THE MORNING ON AN EMPTY STOMACH, Disp: 90 tablet, Rfl: 3   FIBER PO, Take by mouth., Disp: , Rfl:    losartan (COZAAR) 100 MG tablet, Take 100 mg by mouth daily., Disp: , Rfl:    lovastatin (MEVACOR) 20 MG tablet, TAKE 1 TABLET AT BEDTIME, Disp: 90 tablet, Rfl: 3   montelukast (SINGULAIR) 10 MG tablet, TAKE 1 TABLET DAILY, Disp: 90 tablet, Rfl: 3  Allergies  Allergen Reactions   Lisinopril     cough   Codeine Nausea Only and Nausea And Vomiting     Review of Systems  Constitutional:  Positive for diaphoresis (part of aura). Negative for chills, fever, malaise/fatigue and weight loss.  HENT:   Positive for tinnitus (chronic). Negative for hearing loss, nosebleeds and sore throat.   Eyes:  Positive for photophobia. Negative for blurred vision and double vision.  Respiratory:  Positive for cough. Negative for shortness of breath and wheezing.   Cardiovascular:  Negative for chest pain, palpitations and leg swelling.  Gastrointestinal:  Positive for constipation and diarrhea. Negative for blood in stool, heartburn, nausea and vomiting.  Genitourinary:  Negative for dysuria and frequency.       Vaginal itching   Musculoskeletal:  Negative for falls, joint pain and myalgias.  Skin:  Positive for rash (along thighs- sees Derm). Negative for itching.  Neurological:  Positive for headaches. Negative for dizziness, tingling, tremors, loss of consciousness and weakness.       Had an aura recently   Psychiatric/Behavioral:  Negative for depression and memory loss. The patient is not nervous/anxious and does not have insomnia.  Objective  Vitals:   04/10/23 1016  BP: 124/78  Pulse: 72  Resp: 16  Temp: 98.1 F (36.7 C)  TempSrc: Oral  SpO2: 98%  Weight: 157 lb 11.2 oz (71.5 kg)  Height: 5\' 2"  (1.575 m)    Body mass index is 28.84 kg/m.  Physical Exam Vitals reviewed.  Constitutional:      General: She is awake.     Appearance: Normal appearance. She is well-developed and well-groomed.  HENT:     Head: Normocephalic and atraumatic.     Right Ear: Hearing, tympanic membrane, ear canal and external ear normal.     Left Ear: Hearing, tympanic membrane, ear canal and external ear normal.     Nose: Nose normal.     Mouth/Throat:     Lips: Pink.     Mouth: Mucous membranes are moist.     Pharynx: Oropharynx is clear. No oropharyngeal exudate or posterior oropharyngeal erythema.  Eyes:     General: Lids are normal. Gaze aligned appropriately.     Extraocular Movements: Extraocular movements intact.     Conjunctiva/sclera: Conjunctivae normal.     Pupils: Pupils are  equal, round, and reactive to light.  Neck:     Thyroid: No thyroid mass, thyromegaly or thyroid tenderness.  Cardiovascular:     Rate and Rhythm: Normal rate and regular rhythm.     Pulses: Normal pulses.          Radial pulses are 2+ on the right side and 2+ on the left side.     Heart sounds: Normal heart sounds. No murmur heard.    No friction rub. No gallop.  Pulmonary:     Effort: Pulmonary effort is normal.     Breath sounds: Normal breath sounds. No decreased air movement. No decreased breath sounds, wheezing, rhonchi or rales.  Abdominal:     General: Abdomen is flat. Bowel sounds are normal.     Palpations: Abdomen is soft.     Tenderness: There is no abdominal tenderness.  Musculoskeletal:        General: Normal range of motion.     Cervical back: Normal range of motion and neck supple. No pain with movement.     Right lower leg: No edema.     Left lower leg: No edema.  Lymphadenopathy:     Head:     Right side of head: No submental, submandibular or preauricular adenopathy.     Left side of head: No submental, submandibular or preauricular adenopathy.     Cervical:     Right cervical: No superficial or posterior cervical adenopathy.    Left cervical: No superficial or posterior cervical adenopathy.     Upper Body:     Right upper body: No supraclavicular adenopathy.     Left upper body: No supraclavicular adenopathy.  Skin:    General: Skin is warm and dry.     Capillary Refill: Capillary refill takes less than 2 seconds.  Neurological:     General: No focal deficit present.     Mental Status: She is alert and oriented to person, place, and time.     GCS: GCS eye subscore is 4. GCS verbal subscore is 5. GCS motor subscore is 6.     Cranial Nerves: No cranial nerve deficit, dysarthria or facial asymmetry.     Motor: No weakness, tremor, atrophy or abnormal muscle tone.     Gait: Gait is intact.     Deep Tendon Reflexes:  Reflex Scores:      Patellar reflexes  are 2+ on the right side and 2+ on the left side. Psychiatric:        Attention and Perception: Attention and perception normal.        Mood and Affect: Mood and affect normal.        Speech: Speech normal.        Behavior: Behavior normal. Behavior is cooperative.        Thought Content: Thought content normal.        Cognition and Memory: Cognition and memory normal.        Judgment: Judgment normal.      No results found for this or any previous visit (from the past 2160 hour(s)).   Fall Risk:    04/10/2023   10:16 AM 12/21/2022    2:15 PM 04/19/2022   10:23 AM 09/15/2021    8:14 AM 09/09/2021    1:05 PM  Fall Risk   Falls in the past year? 0 0 0 0 0  Number falls in past yr: 0 0  0 0  Injury with Fall? 0 0  0 0  Risk for fall due to : No Fall Risks No Fall Risks No Fall Risks  No Fall Risks  Follow up Falls prevention discussed;Education provided;Falls evaluation completed Falls prevention discussed;Education provided;Falls evaluation completed Education provided;Falls prevention discussed Falls evaluation completed Falls prevention discussed     Functional Status Survey: Is the patient deaf or have difficulty hearing?: No Does the patient have difficulty seeing, even when wearing glasses/contacts?: No Does the patient have difficulty concentrating, remembering, or making decisions?: No Does the patient have difficulty walking or climbing stairs?: No Does the patient have difficulty dressing or bathing?: No Does the patient have difficulty doing errands alone such as visiting a doctor's office or shopping?: No   Assessment & Plan  Problem List Items Addressed This Visit       Other   Vitamin B12 deficiency anemia   Relevant Orders   B12   Other Visit Diagnoses     Annual physical exam    -  Primary   Relevant Orders   TSH   Hemoglobin A1c   Lipid panel   CBC with Differential/Platelet   COMPLETE METABOLIC PANEL WITH GFR   Vulvovaginal itching       Relevant  Orders   Cervicovaginal ancillary only       -USPSTF grade A and B recommendations reviewed with patient; age-appropriate recommendations, preventive care, screening tests, etc discussed and encouraged; healthy living encouraged; see AVS for patient education given to patient -Discussed importance of 150 minutes of physical activity weekly, eat two servings of fish weekly, eat one serving of tree nuts ( cashews, pistachios, pecans, almonds.Marland Kitchen) every other day, eat 6 servings of fruit/vegetables daily and drink plenty of water and avoid sweet beverages.   -Reviewed Health Maintenance: Yes.    Patient reports vulvovaginal itching, ongoing for some time Will get cervicovaginal swab to rule out BV, trich, yeast Results to dictate further management    No follow-ups on file.   I, Azzam Mehra E Laurette Villescas, PA-C, have reviewed all documentation for this visit. The documentation on 04/10/23 for the exam, diagnosis, procedures, and orders are all accurate and complete.   Jacquelin Hawking, MHS, PA-C [facility]

## 2023-04-10 NOTE — Patient Instructions (Addendum)
If you would like to discuss you mood and sleep concerns please schedule an apt in the next few weeks to talk further. There are plenty of options that we could try to provide some stability and sleep improvement if you want to pursue this.

## 2023-04-11 LAB — COMPLETE METABOLIC PANEL WITH GFR
AG Ratio: 1.2 (calc) (ref 1.0–2.5)
ALT: 8 U/L (ref 6–29)
AST: 17 U/L (ref 10–35)
Albumin: 4.2 g/dL (ref 3.6–5.1)
Alkaline phosphatase (APISO): 56 U/L (ref 37–153)
BUN/Creatinine Ratio: 10 (calc) (ref 6–22)
BUN: 13 mg/dL (ref 7–25)
CO2: 28 mmol/L (ref 20–32)
Calcium: 9.5 mg/dL (ref 8.6–10.4)
Chloride: 101 mmol/L (ref 98–110)
Creat: 1.31 mg/dL — ABNORMAL HIGH (ref 0.60–1.00)
Globulin: 3.4 g/dL (ref 1.9–3.7)
Glucose, Bld: 102 mg/dL — ABNORMAL HIGH (ref 65–99)
Potassium: 4.9 mmol/L (ref 3.5–5.3)
Sodium: 137 mmol/L (ref 135–146)
Total Bilirubin: 1.3 mg/dL — ABNORMAL HIGH (ref 0.2–1.2)
Total Protein: 7.6 g/dL (ref 6.1–8.1)
eGFR: 42 mL/min/{1.73_m2} — ABNORMAL LOW (ref >=60)

## 2023-04-11 LAB — CBC WITH DIFFERENTIAL/PLATELET
Absolute Lymphocytes: 1522 {cells}/uL (ref 850–3900)
Absolute Monocytes: 550 {cells}/uL (ref 200–950)
Basophils Absolute: 52 {cells}/uL (ref 0–200)
Basophils Relative: 0.6 %
Eosinophils Absolute: 26 {cells}/uL (ref 15–500)
Eosinophils Relative: 0.3 %
HCT: 32.5 % — ABNORMAL LOW (ref 35.0–45.0)
Hemoglobin: 10.8 g/dL — ABNORMAL LOW (ref 11.7–15.5)
MCH: 32.7 pg (ref 27.0–33.0)
MCHC: 33.2 g/dL (ref 32.0–36.0)
MCV: 98.5 fL (ref 80.0–100.0)
MPV: 13.3 fL — ABNORMAL HIGH (ref 7.5–12.5)
Monocytes Relative: 6.4 %
Neutro Abs: 6450 {cells}/uL (ref 1500–7800)
Neutrophils Relative %: 75 %
Platelets: 139 10*3/uL — ABNORMAL LOW (ref 140–400)
RBC: 3.3 10*6/uL — ABNORMAL LOW (ref 3.80–5.10)
RDW: 14.1 % (ref 11.0–15.0)
Total Lymphocyte: 17.7 %
WBC: 8.6 10*3/uL (ref 3.8–10.8)

## 2023-04-11 LAB — HEMOGLOBIN A1C
Hgb A1c MFr Bld: 6.1 %{Hb} — ABNORMAL HIGH (ref <5.7)
Mean Plasma Glucose: 128 mg/dL
eAG (mmol/L): 7.1 mmol/L

## 2023-04-11 LAB — CERVICOVAGINAL ANCILLARY ONLY
Bacterial Vaginitis (gardnerella): POSITIVE — AB
Candida Glabrata: NEGATIVE
Candida Vaginitis: NEGATIVE
Comment: NEGATIVE
Comment: NEGATIVE
Comment: NEGATIVE
Comment: NEGATIVE
Trichomonas: NEGATIVE

## 2023-04-11 LAB — LIPID PANEL
Cholesterol: 155 mg/dL (ref <200)
HDL: 66 mg/dL (ref >=50)
LDL Cholesterol (Calc): 69 mg/dL
Non-HDL Cholesterol (Calc): 89 mg/dL (ref <130)
Total CHOL/HDL Ratio: 2.3 (calc) (ref <5.0)
Triglycerides: 117 mg/dL (ref <150)

## 2023-04-11 LAB — VITAMIN B12: Vitamin B-12: 486 pg/mL (ref 200–1100)

## 2023-04-11 LAB — TSH: TSH: 1.84 m[IU]/L (ref 0.40–4.50)

## 2023-04-12 MED ORDER — METRONIDAZOLE 500 MG PO TABS: 500.0000 mg | ORAL_TABLET | Freq: Two times a day (BID) | ORAL | 0 refills | Status: DC

## 2023-04-12 MED ORDER — METRONIDAZOLE 500 MG PO TABS
500.0000 mg | ORAL_TABLET | Freq: Two times a day (BID) | ORAL | 0 refills | Status: DC
Start: 2023-04-12 — End: 2023-05-25

## 2023-04-12 NOTE — Progress Notes (Signed)
Your labs are back Your A1c was 6.1% which is in the prediabetic range. No medications are indicated at this time but I do recommend reducing your sugar and carb intake and making sure you are exercising regularly Your CBC demonstrates mild anemia that appears to be improving since it was last checked 3 months ago.  Please continue your current treatment regimen Your electrolytes appear to be in overall normal ranges.  Your kidney function is decreased but this appears to be stable compared to previous results.  Your liver function testing appears to be in normal ranges Your thyroid testing was normal Your cholesterol testing was in normal ranges Your vitamin B12 is in normal range Your cervicovaginal swab was positive for bacterial vaginosis.  I have sent in a script for Flagyl to be taken by mouth twice per day for 7 days to the [facility] on [address] . Please finish the entire course unless you are instructed to stop or develop an allergic reaction. Please note that this medication can cause severe nausea and vomiting if alcohol is consumed while you are taking it so please refrain from this during the week you are on it. Please let us know if you have further questions or concerns.

## 2023-04-12 NOTE — Addendum Note (Signed)
Addended by: Jacquelin Hawking on: 04/12/2023 01:04 PM   Modules accepted: Orders

## 2023-04-25 ENCOUNTER — Ambulatory Visit: Payer: Medicare HMO

## 2023-05-01 ENCOUNTER — Ambulatory Visit
Admission: RE | Admit: 2023-05-01 | Discharge: 2023-05-01 | Disposition: A | Discharge status: Home / Self Care | Payer: Medicare HMO | Source: Ambulatory Visit | Attending: Family Medicine | Admitting: Family Medicine

## 2023-05-01 DIAGNOSIS — Z1231 Encounter for screening mammogram for malignant neoplasm of breast: Secondary | ICD-10-CM | POA: Diagnosis not present

## 2023-05-18 ENCOUNTER — Encounter: Payer: Self-pay | Admitting: Oncology

## 2023-05-22 ENCOUNTER — Encounter: Payer: Self-pay | Admitting: Oncology

## 2023-05-25 ENCOUNTER — Ambulatory Visit (INDEPENDENT_AMBULATORY_CARE_PROVIDER_SITE_OTHER): Payer: Medicare Other

## 2023-05-25 DIAGNOSIS — Z0000 Encounter for general adult medical examination without abnormal findings: Secondary | ICD-10-CM

## 2023-05-25 NOTE — Patient Instructions (Addendum)
 Pamela Hendrix , Thank you for taking time to come for your Medicare Wellness Visit. I appreciate your ongoing commitment to your health goals. Please review the following plan we discussed and let me know if I can assist you in the future.   Referrals/Orders/Follow-Ups/Clinician Recommendations: NONE  This is a list of the screening recommended for you and due dates:  Health Maintenance  Topic Date Due   COVID-19 Vaccine (5 - 2024-25 season) 01/15/2023   Zoster (Shingles) Vaccine (2 of 2) 05/10/2023   DEXA scan (bone density measurement)  04/27/2024   Mammogram  04/30/2024   Medicare Annual Wellness Visit  05/24/2024   Colon Cancer Screening  11/05/2026   Pneumonia Vaccine  Completed   Flu Shot  Completed   Hepatitis C Screening  Completed   HPV Vaccine  Aged Out   DTaP/Tdap/Td vaccine  Discontinued    Advanced directives: (In Chart) A copy of your advanced directives are scanned into your chart should your provider ever need it.  Next Medicare Annual Wellness Visit scheduled for next year: Yes   05/30/24 @ 11:30 AM BY VIDEO

## 2023-05-25 NOTE — Progress Notes (Signed)
 Subjective:   Pamela Hendrix is a 78 y.o. female who presents for Medicare Annual (Subsequent) preventive examination.  Visit Complete: Virtual I connected with  Rock Leanna Clay on 05/25/23 by a video and audio enabled telemedicine application and verified that I am speaking with the correct person using two identifiers.  Patient Location: Home  Provider Location: Office/Clinic  I discussed the limitations of evaluation and management by telemedicine. The patient expressed understanding and agreed to proceed.  Vital Signs: Because this visit was a virtual/telehealth visit, some criteria may be missing or patient reported. Any vitals not documented were not able to be obtained and vitals that have been documented are patient reported.  Cardiac Risk Factors include: advanced age (>81men, >109 women);dyslipidemia;hypertension;sedentary lifestyle     Objective:    There were no vitals filed for this visit. There is no height or weight on file to calculate BMI.     05/25/2023    9:42 AM 12/30/2022   10:23 AM 10/28/2022   10:33 AM 05/05/2022    1:01 PM 12/30/2021    1:33 PM 11/04/2021    9:48 AM 10/21/2021    7:06 AM  Advanced Directives  Does Patient Have a Medical Advance Directive? Yes Yes Yes Yes Yes Yes Yes  Type of Estate Agent of [place];Living will Healthcare Power of [place];Living will  Healthcare Power of [place];Living will Healthcare Power of [place];Living will  Healthcare Power of [place];Living will  Does patient want to make changes to medical advance directive? No - Patient declined   No - Patient declined   No - Patient declined  Copy of Healthcare Power of Attorney in Chart? Yes - validated most recent copy scanned in chart (See row information)   No - copy requested   No - copy requested    Current Medications (verified) Outpatient Encounter Medications as of 05/25/2023  Medication Sig   alendronate  (FOSAMAX ) 70 MG tablet  TAKE 1 TABLET EVERY 7 DAYS,TAKE WITH A FULL GLASS OF  WATER  ON AN EMPTY STOMACH   cetirizine (ZYRTEC) 10 MG tablet Take 10 mg by mouth daily. otc   Cholecalciferol (VITAMIN D -3) 125 MCG (5000 UT) TABS Take 1 capsule by mouth daily.   EUTHYROX  88 MCG tablet TAKE 1 TABLET DAILY FIRST  THING IN THE MORNING ON AN EMPTY STOMACH   FIBER PO Take by mouth.   losartan (COZAAR) 100 MG tablet Take 100 mg by mouth daily.   lovastatin  (MEVACOR ) 20 MG tablet TAKE 1 TABLET AT BEDTIME   magnesium gluconate (MAGONATE) 500 MG tablet Take 250 mg by mouth daily.   montelukast  (SINGULAIR ) 10 MG tablet TAKE 1 TABLET DAILY   [DISCONTINUED] metroNIDAZOLE  (FLAGYL ) 500 MG tablet Take 1 tablet (500 mg total) by mouth 2 (two) times daily.   No facility-administered encounter medications on file as of 05/25/2023.    Allergies (verified) Lisinopril and Codeine   History: Past Medical History:  Diagnosis Date   Age-related osteoporosis without current pathological fracture 09/09/2021   Allergy    Anemia 2000   estimate   Anemia    Anxiety    not bad   Blood transfusion without reported diagnosis 04/07/2017   estimate   Bradycardia    Bursitis    CKD (chronic kidney disease) stage 3, GFR 30-59 ml/min (HCC)    Depression    not bad   Diverticulosis    GERD (gastroesophageal reflux disease)    Hiatal hernia    Hypertension    Hypothyroidism  Migraine with aura    states she doesn't have a headache, just the aura   Mixed hyperlipidemia    Osteoarthritis of knee    Thyroid  disease    Past Surgical History:  Procedure Laterality Date   CHOLECYSTECTOMY     COLONOSCOPY  06/02/2014   cleared for 10 yrs- Dr Jeri   COLONOSCOPY N/A 04/07/2017   Procedure: COLONOSCOPY;  Surgeon: Unk Corinn Skiff, MD;  Location: [facility] ENDOSCOPY;  Service: Gastroenterology;  Laterality: N/A;   COLONOSCOPY WITH PROPOFOL  N/A 11/04/2021   Procedure: COLONOSCOPY WITH PROPOFOL ;  Surgeon: Therisa Bi, MD;  Location: [facility]  ENDOSCOPY;  Service: Gastroenterology;  Laterality: N/A;   ESOPHAGEAL DILATION     ESOPHAGEAL DILATION  10/21/2021   Procedure: ESOPHAGEAL DILATION;  Surgeon: Jinny Carmine, MD;  Location: [facility];  Service: Endoscopy;;  12-18 mm   ESOPHAGOGASTRODUODENOSCOPY N/A 04/07/2017   Procedure: ESOPHAGOGASTRODUODENOSCOPY (EGD);  Surgeon: Unk Corinn Skiff, MD;  Location: [facility] ENDOSCOPY;  Service: Gastroenterology;  Laterality: N/A;   ESOPHAGOGASTRODUODENOSCOPY N/A 10/21/2021   Procedure: ESOPHAGOGASTRODUODENOSCOPY (EGD);  Surgeon: Jinny Carmine, MD;  Location: [facility];  Service: Endoscopy;  Laterality: N/A;   HERNIA REPAIR  08/28/2014   estimate   TONSILLECTOMY     TUBAL LIGATION  1975   estimate   UPPER GI ENDOSCOPY  06/02/2014   Family History  Problem Relation Age of Onset   Kidney disease Mother    Cancer Mother        brain   Arthritis Mother    Obesity Mother    Varicose Veins Mother    Heart disease Father    Heart attack Father    Hearing loss Father    Hypertension Father    Lupus Sister    Diabetes Sister    Thyroid  disease Sister    Rheum arthritis Sister    Arthritis Sister    Obesity Sister    Kidney disease Sister    Diabetes Sister    Arthritis Sister    Cancer Sister    Hearing loss Sister    Intellectual disability Sister    Learning disabilities Sister    Multiple myeloma Sister    Thyroid  disease Sister    Rheum arthritis Sister    Arthritis Sister    Cancer Sister    Obesity Sister    Breast cancer Maternal Aunt    Miscarriages / Stillbirths Daughter    Social History   Socioeconomic History   Marital status: Married    Spouse name: Manus   Number of children: 1   Years of education: some college   Highest education level: 12th grade  Occupational History   Occupation: Retired  Tobacco Use   Smoking status: Never   Smokeless tobacco: Never   Tobacco comments:    Never, but father and husband smoked  Vaping Use   Vaping  status: Never Used  Substance and Sexual Activity   Alcohol use: Not Currently    Comment: on special occasions like New Years Eve   Drug use: Never   Sexual activity: Not Currently    Birth control/protection: Post-menopausal, Surgical    Comment: tubes tied in my 30's, now post-menopausal  Other Topics Concern   Not on file  Social History Narrative   Not on file   Social Drivers of Health   Financial Resource Strain: Low Risk  (05/25/2023)   Overall Financial Resource Strain (CARDIA)    Difficulty of Paying Living Expenses: Not hard at all  Food Insecurity:  No Food Insecurity (05/25/2023)   Hunger Vital Sign    Worried About Running Out of Food in the Last Year: Never true    Ran Out of Food in the Last Year: Never true  Transportation Needs: No Transportation Needs (05/25/2023)   PRAPARE - Administrator, Civil Service (Medical): No    Lack of Transportation (Non-Medical): No  Physical Activity: Insufficiently Active (05/25/2023)   Exercise Vital Sign    Days of Exercise per Week: 1 day    Minutes of Exercise per Session: 60 min  Stress: No Stress Concern Present (05/25/2023)   [employer] of Occupational Health - Occupational Stress Questionnaire    Feeling of Stress : Only a little  Social Connections: Socially Integrated (05/25/2023)   Social Connection and Isolation Panel [NHANES]    Frequency of Communication with Friends and Family: Twice a week    Frequency of Social Gatherings with Friends and Family: Once a week    Attends Religious Services: More than 4 times per year    Active Member of [employer] or Organizations: Yes    Attends Engineer, Structural: More than 4 times per year    Marital Status: Married    Tobacco Counseling Counseling given: Not Answered Tobacco comments: Never, but father and husband smoked   Clinical Intake:  Pre-visit preparation completed: Yes  Pain : No/denies pain     Nutritional Risks: None Diabetes: No  How  often do you need to have someone help you when you read instructions, pamphlets, or other written materials from your doctor or pharmacy?: 1 - Never  Interpreter Needed?: No  Information entered by :: JHONNIE DAS, LPN   Activities of Daily Living    05/25/2023    9:44 AM 04/10/2023   10:16 AM  In your present state of health, do you have any difficulty performing the following activities:  Hearing? 0 0  Vision? 0 0  Difficulty concentrating or making decisions? 0 0  Walking or climbing stairs? 0 0  Dressing or bathing? 0 0  Doing errands, shopping? 0 0  Preparing Food and eating ? N   Using the Toilet? N   In the past six months, have you accidently leaked urine? N   Do you have problems with loss of bowel control? N   Managing your Medications? N   Managing your Finances? N   Housekeeping or managing your Housekeeping? N     Patient Care Team: Leavy Mole, PA-C as PCP - General (Family Medicine) Damian Therisa HERO, MD as Physician Assistant (Endocrinology) Hester Wolm PARAS, MD as Consulting Physician (Cardiology) Unk Corinn Skiff, MD as Consulting Physician (Gastroenterology) Dingeldein, Elspeth, MD (Ophthalmology)  Indicate any recent Medical Services you may have received from other than [facility] providers in the past year (date may be approximate).     Assessment:   This is a routine wellness examination for [place].  Hearing/Vision screen Hearing Screening - Comments:: NO AIDS Vision Screening - Comments:: WEARS GLASSES ALL THE TIME- DR.DINGELDEIN   Goals Addressed             This Visit's Progress    DIET - EAT MORE FRUITS AND VEGETABLES         Depression Screen    05/25/2023    9:37 AM 04/10/2023   10:16 AM 12/21/2022    2:15 PM 04/19/2022   10:13 AM 09/15/2021    8:18 AM 09/09/2021    1:05 PM 04/15/2021   10:13 AM  PHQ 2/9 Scores  PHQ - 2 Score 2 2 2 2 2 2 2   PHQ- 9 Score 3 9 9 8 6 6 4     Fall Risk    05/25/2023    9:44 AM 04/10/2023   10:16 AM  12/21/2022    2:15 PM 04/19/2022   10:23 AM 09/15/2021    8:14 AM  Fall Risk   Falls in the past year? 0 0 0 0 0  Number falls in past yr: 0 0 0  0  Injury with Fall? 0 0 0  0  Risk for fall due to : No Fall Risks No Fall Risks No Fall Risks No Fall Risks   Follow up Falls prevention discussed;Falls evaluation completed Falls prevention discussed;Education provided;Falls evaluation completed Falls prevention discussed;Education provided;Falls evaluation completed Education provided;Falls prevention discussed Falls evaluation completed    MEDICARE RISK AT HOME: Medicare Risk at Home Any stairs in or around the home?: Yes If so, are there any without handrails?: No Home free of loose throw rugs in walkways, pet beds, electrical cords, etc?: Yes Adequate lighting in your home to reduce risk of falls?: Yes Life alert?: No Use of a cane, walker or w/c?: No Grab bars in the bathroom?: Yes Shower chair or bench in shower?: Yes Elevated toilet seat or a handicapped toilet?: No  TIMED UP AND GO:  Was the test performed?  No    Cognitive Function:        05/25/2023    9:46 AM 04/19/2022   10:20 AM 02/20/2020    1:56 PM 02/19/2019    1:54 PM 09/04/2017    1:55 PM  6CIT Screen  What Year? 0 points 0 points 0 points 0 points 0 points  What month? 0 points 0 points 0 points 0 points 0 points  What time? 0 points 0 points 0 points 0 points 0 points  Count back from 20 0 points 0 points 0 points 0 points 0 points  Months in reverse 0 points 0 points 0 points 0 points 0 points  Repeat phrase 0 points 0 points 2 points 0 points 0 points  Total Score 0 points 0 points 2 points 0 points 0 points    Immunizations Immunization History  Administered Date(s) Administered   Fluad Quad(high Dose 65+) 02/16/2019, 02/20/2020   H1N1 03/01/2017   Influenza, High Dose Seasonal PF 03/01/2017, 01/22/2018   Influenza,inj,Quad PF,6+ Mos 04/02/2015   Influenza-Unspecified 02/25/2021, 01/24/2022, 01/30/2023    PFIZER(Purple Top)SARS-COV-2 Vaccination 07/08/2019, 07/29/2019, 02/10/2020, 09/07/2020   Pneumococcal Conjugate-13 06/19/2015   Pneumococcal Polysaccharide-23 03/05/2012   Rsv, Bivalent, Protein Subunit Rsvpref,pf [place]) 01/24/2022   Tdap 10/14/2010   Zoster Recombinant(Shingrix) 03/15/2023   Zoster, Live 02/25/2011  HAS HAD 1ST SHINGRIX  TDAP status: Due, Education has been provided regarding the importance of this vaccine. Advised may receive this vaccine at local pharmacy or Health Dept. Aware to provide a copy of the vaccination record if obtained from local pharmacy or Health Dept. Verbalized acceptance and understanding.  Flu Vaccine status: Up to date  Pneumococcal vaccine status: Declined,  Education has been provided regarding the importance of this vaccine but patient still declined. Advised may receive this vaccine at local pharmacy or Health Dept. Aware to provide a copy of the vaccination record if obtained from local pharmacy or Health Dept. Verbalized acceptance and understanding.   Covid-19 vaccine status: Completed vaccines  Qualifies for Shingles Vaccine? Yes   Zostavax completed Yes   Shingrix Completed?: No.  Education has been provided regarding the importance of this vaccine. Patient has been advised to call insurance company to determine out of pocket expense if they have not yet received this vaccine. Advised may also receive vaccine at local pharmacy or Health Dept. Verbalized acceptance and understanding.  Screening Tests Health Maintenance  Topic Date Due   COVID-19 Vaccine (5 - 2024-25 season) 01/15/2023   Zoster Vaccines- Shingrix (2 of 2) 05/10/2023   DEXA SCAN  04/27/2024   MAMMOGRAM  04/30/2024   Medicare Annual Wellness (AWV)  05/24/2024   Colonoscopy  11/05/2026   Pneumonia Vaccine 89+ Years old  Completed   INFLUENZA VACCINE  Completed   Hepatitis C Screening  Completed   HPV VACCINES  Aged Out   DTaP/Tdap/Td  Discontinued    Health  Maintenance  Health Maintenance Due  Topic Date Due   COVID-19 Vaccine (5 - 2024-25 season) 01/15/2023   Zoster Vaccines- Shingrix (2 of 2) 05/10/2023    Colorectal cancer screening: Type of screening: Colonoscopy. Completed 11/04/21. Repeat every 2 years  Mammogram status: Completed 05/01/23. Repeat every year- AGED OUT  Bone Density status: Completed 04/27/22. Results reflect: Bone density results: OSTEOPOROSIS. Repeat every 2 years.  Lung Cancer Screening: (Low Dose CT Chest recommended if Age 64-80 years, 20 pack-year currently smoking OR have quit w/in 15years.) does not qualify.    Additional Screening:  Hepatitis C Screening: does qualify; Completed 06/28/16  Vision Screening: Recommended annual ophthalmology exams for early detection of glaucoma and other disorders of the eye. Is the patient up to date with their annual eye exam?  Yes  Who is the provider or what is the name of the office in which the patient attends annual eye exams? DR.DINGELDEIN If pt is not established with a provider, would they like to be referred to a provider to establish care? No .   Dental Screening: Recommended annual dental exams for proper oral hygiene   Community Resource Referral / Chronic Care Management: CRR required this visit?  No   CCM required this visit?  No     Plan:     I have personally reviewed and noted the following in the patient's chart:   Medical and social history Use of alcohol, tobacco or illicit drugs  Current medications and supplements including opioid prescriptions. Patient is not currently taking opioid prescriptions. Functional ability and status Nutritional status Physical activity Advanced directives List of other physicians Hospitalizations, surgeries, and ER visits in previous 12 months Vitals Screenings to include cognitive, depression, and falls Referrals and appointments  In addition, I have reviewed and discussed with patient certain  preventive protocols, quality metrics, and best practice recommendations. A written personalized care plan for preventive services as well as general preventive health recommendations were provided to patient.     Jhonnie GORMAN Das, LPN   [id number]/[id number]   After Visit Summary: (MyChart) Due to this being a telephonic visit, the after visit summary with patients personalized plan was offered to patient via MyChart   Nurse Notes: NONE

## 2023-06-12 ENCOUNTER — Other Ambulatory Visit: Payer: Self-pay | Admitting: Family Medicine

## 2023-06-12 DIAGNOSIS — M810 Age-related osteoporosis without current pathological fracture: Secondary | ICD-10-CM

## 2023-06-12 DIAGNOSIS — M816 Localized osteoporosis [Lequesne]: Secondary | ICD-10-CM

## 2023-06-12 NOTE — Telephone Encounter (Signed)
Medication Refill -  Most Recent Primary Care Visit:  Provider: Hal Hope  Department: [facility]  Visit Type: MEDICARE WELL VISIT  Date: 05/25/2023  Medication: alendronate (FOSAMAX) 70 MG tablet [[id number]]   Has the patient contacted their pharmacy? Yes (Agent: If no, request that the patient contact the pharmacy for the refill. If patient does not wish to contact the pharmacy document the reason why and proceed with request.) (Agent: If yes, when and what did the pharmacy advise?)  Is this the correct pharmacy for this prescription? Yes If no, delete pharmacy and type the correct one.  This is the patient's preferred pharmacy: [facility] - [place], [place] - [address] [[id number]]   Has the prescription been filled recently? Yes  Is the patient out of the medication? Yes  Has the patient been seen for an appointment in the last year OR does the patient have an upcoming appointment? Yes  Can we respond through MyChart? Yes  Agent: Please be advised that Rx refills may take up to 3 business days. We ask that you follow-up with your pharmacy.

## 2023-06-14 MED ORDER — ALENDRONATE SODIUM 70 MG PO TABS: ORAL_TABLET | ORAL | 0 refills | Status: DC

## 2023-06-14 NOTE — Progress Notes (Signed)
The following biosimilar Retacrit (epoetin alpha-epbx) has been selected for use in this patient.  Ebony Hail, Pharm.D., CPP 06/14/2023@12 :02 PM

## 2023-06-14 NOTE — Telephone Encounter (Signed)
Change in pharmacy- Rx forwarded Requested Prescriptions  Pending Prescriptions Disp Refills   alendronate (FOSAMAX) 70 MG tablet 12 tablet 0    Sig: TAKE 1 TABLET EVERY 7 DAYS,TAKE WITH A FULL GLASS OF  WATER ON AN EMPTY STOMACH     Endocrinology:  Bisphosphonates Failed - 06/14/2023 11:28 AM      Failed - Cr in normal range and within 360 days    Creat  Date Value Ref Range Status  04/10/2023 1.31 (H) 0.60 - 1.00 mg/dL Final         Failed - Mg Level in normal range and within 360 days    Magnesium  Date Value Ref Range Status  09/03/2019 2.2 1.5 - 2.5 mg/dL Final         Failed - Phosphate in normal range and within 360 days    Phosphorus  Date Value Ref Range Status  09/07/2016 3.7 2.5 - 4.5 mg/dL Final         Passed - Ca in normal range and within 360 days    Calcium  Date Value Ref Range Status  04/10/2023 9.5 8.6 - 10.4 mg/dL Final   Calcium, Total  Date Value Ref Range Status  12/05/2013 9.1 8.5 - 10.1 mg/dL Final         Passed - Vitamin D in normal range and within 360 days    Vit D, 25-Hydroxy  Date Value Ref Range Status  12/21/2022 72 30 - 100 ng/mL Final    Comment:    Vitamin D Status         25-OH Vitamin D: . Deficiency:                    <20 ng/mL Insufficiency:             20 - 29 ng/mL Optimal:                 > or = 30 ng/mL . For 25-OH Vitamin D testing on patients on  D2-supplementation and patients for whom quantitation  of D2 and D3 fractions is required, the [employer]ure[facility](TM) 25-OH VIT D, (D2,D3), LC/MS/MS is recommended: order  code 53664 (patients >86yrs). . See Note 1 . Note 1 . For additional information, please refer to  http://education.[facility].com/faq/FAQ199  (This link is being provided for informational/ educational purposes only.)          Passed - eGFR is 30 or above and within 360 days    GFR, Est African American  Date Value Ref Range Status  08/20/2020 50 (L) > OR = 60 mL/min/1.80m2 Final   GFR, Est  Non African American  Date Value Ref Range Status  08/20/2020 43 (L) > OR = 60 mL/min/1.15m2 Final   GFR, Estimated  Date Value Ref Range Status  12/30/2022 44 (L) >60 mL/min Final    Comment:    (NOTE) Calculated using the CKD-EPI Creatinine Equation (2021)    eGFR  Date Value Ref Range Status  04/10/2023 42 (L) > OR = 60 mL/min/1.23m2 Final         Passed - Valid encounter within last 12 months    Recent Outpatient Visits           2 months ago Annual physical exam   [facility] Mecum, Oswaldo Conroy, PA-C   5 months ago Primary hypertension   [place] Danelle Berry, PA-C   1 year ago Annual physical exam   [facility] Berniece Salines, FNP   1 year ago Primary hypertension   [place] [facility] [facility] Mecum, Oswaldo Conroy, PA-C   2 years ago Low back pain radiating to both legs   [facility] Berniece Salines, [place]              Passed - Bone Mineral Density or Dexa Scan completed in the last 2 years

## 2023-06-28 ENCOUNTER — Encounter: Payer: Medicare HMO | Admitting: Family Medicine

## 2023-06-30 ENCOUNTER — Other Ambulatory Visit: Payer: Self-pay | Admitting: *Deleted

## 2023-06-30 DIAGNOSIS — D631 Anemia in chronic kidney disease: Secondary | ICD-10-CM

## 2023-07-03 ENCOUNTER — Inpatient Hospital Stay: Payer: Medicare Other | Attending: Oncology

## 2023-07-03 ENCOUNTER — Inpatient Hospital Stay: Payer: Medicare Other | Admitting: Oncology

## 2023-07-03 ENCOUNTER — Encounter: Payer: Self-pay | Admitting: Oncology

## 2023-07-03 ENCOUNTER — Inpatient Hospital Stay: Payer: Medicare Other

## 2023-07-03 VITALS — BP 126/65 | HR 65 | Temp 97.9°F | Resp 16 | Ht 62.0 in | Wt 159.5 lb

## 2023-07-03 DIAGNOSIS — N189 Chronic kidney disease, unspecified: Secondary | ICD-10-CM

## 2023-07-03 DIAGNOSIS — Z807 Family history of other malignant neoplasms of lymphoid, hematopoietic and related tissues: Secondary | ICD-10-CM | POA: Insufficient documentation

## 2023-07-03 DIAGNOSIS — Z803 Family history of malignant neoplasm of breast: Secondary | ICD-10-CM | POA: Diagnosis not present

## 2023-07-03 DIAGNOSIS — N1831 Chronic kidney disease, stage 3a: Secondary | ICD-10-CM | POA: Insufficient documentation

## 2023-07-03 DIAGNOSIS — D631 Anemia in chronic kidney disease: Secondary | ICD-10-CM

## 2023-07-03 DIAGNOSIS — Z808 Family history of malignant neoplasm of other organs or systems: Secondary | ICD-10-CM | POA: Insufficient documentation

## 2023-07-03 LAB — CBC WITH DIFFERENTIAL/PLATELET
Abs Immature Granulocytes: 0.06 10*3/uL (ref 0.00–0.07)
Basophils Absolute: 0.1 10*3/uL (ref 0.0–0.1)
Basophils Relative: 1 %
Eosinophils Absolute: 0.1 10*3/uL (ref 0.0–0.5)
Eosinophils Relative: 1 %
HCT: 32.8 % — ABNORMAL LOW (ref 36.0–46.0)
Hemoglobin: 11 g/dL — ABNORMAL LOW (ref 12.0–15.0)
Immature Granulocytes: 1 %
Lymphocytes Relative: 13 %
Lymphs Abs: 1.2 10*3/uL (ref 0.7–4.0)
MCH: 33.5 pg (ref 26.0–34.0)
MCHC: 33.5 g/dL (ref 30.0–36.0)
MCV: 100 fL (ref 80.0–100.0)
Monocytes Absolute: 0.5 10*3/uL (ref 0.1–1.0)
Monocytes Relative: 5 %
Neutro Abs: 7.5 10*3/uL (ref 1.7–7.7)
Neutrophils Relative %: 79 %
Platelets: 176 10*3/uL (ref 150–400)
RBC: 3.28 MIL/uL — ABNORMAL LOW (ref 3.87–5.11)
RDW: 14.2 % (ref 11.5–15.5)
WBC: 9.3 10*3/uL (ref 4.0–10.5)
nRBC: 0.2 % (ref 0.0–0.2)

## 2023-07-03 LAB — CMP (CANCER CENTER ONLY)
ALT: 10 U/L (ref 0–44)
AST: 18 U/L (ref 15–41)
Albumin: 3.6 g/dL (ref 3.5–5.0)
Alkaline Phosphatase: 51 U/L (ref 38–126)
Anion gap: 7 (ref 5–15)
BUN: 18 mg/dL (ref 8–23)
CO2: 27 mmol/L (ref 22–32)
Calcium: 8.5 mg/dL — ABNORMAL LOW (ref 8.9–10.3)
Chloride: 101 mmol/L (ref 98–111)
Creatinine: 1.38 mg/dL — ABNORMAL HIGH (ref 0.44–1.00)
GFR, Estimated: 39 mL/min — ABNORMAL LOW (ref >60)
Glucose, Bld: 146 mg/dL — ABNORMAL HIGH (ref 70–99)
Potassium: 4.4 mmol/L (ref 3.5–5.1)
Sodium: 135 mmol/L (ref 135–145)
Total Bilirubin: 1.1 mg/dL (ref 0.0–1.2)
Total Protein: 7.3 g/dL (ref 6.5–8.1)

## 2023-07-03 LAB — IRON AND TIBC
Iron: 91 ug/dL (ref 28–170)
Saturation Ratios: 21 % (ref 10.4–31.8)
TIBC: 444 ug/dL (ref 250–450)
UIBC: 353 ug/dL

## 2023-07-03 LAB — FERRITIN: Ferritin: 18 ng/mL (ref 11–307)

## 2023-07-03 NOTE — Progress Notes (Signed)
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  INTERVAL HISTORY: Patient returns to clinic today for repeat laboratory work, further evaluation, and consideration of additional Retacrit.  She currently feels well and is asymptomatic.  She does not complain of any weakness or fatigue. She has no neurologic complaints.  She denies any recent fevers or illnesses.  She has a good appetite and denies weight loss.  She has no chest pain, shortness of breath, cough, or hemoptysis.  She denies any nausea, vomiting, constipation, or diarrhea.  She has no melena or hematochezia.  She has no urinary complaints.  Patient offers no specific complaints today.  REVIEW OF SYSTEMS:   Review of Systems  Constitutional: Negative.  Negative for fever, malaise/fatigue and weight loss.  Respiratory: Negative.  Negative for cough, hemoptysis and shortness of breath.   Cardiovascular: Negative.  Negative for chest pain and leg swelling.  Gastrointestinal: Negative.  Negative for abdominal pain, blood in stool and melena.  Genitourinary: Negative.  Negative for hematuria.  Musculoskeletal: Negative.  Negative for back pain.  Skin: Negative.  Negative for rash.  Neurological: Negative.  Negative for dizziness, focal weakness, weakness and headaches.  Psychiatric/Behavioral:  The patient is not nervous/anxious.     As per HPI. Otherwise, a complete review of systems is negative.  PAST MEDICAL HISTORY: Past Medical History:  Diagnosis Date   Age-related  osteoporosis without current pathological fracture 09/09/2021   Allergy    Anemia 2000   estimate   Anemia    Anxiety    not bad   Blood transfusion without reported diagnosis 04/07/2017   estimate   Bradycardia    Bursitis    CKD (chronic kidney disease) stage 3, GFR 30-59 ml/min (HCC)    Depression    not bad   Diverticulosis    GERD (gastroesophageal reflux disease)    Hiatal hernia    Hypertension    Hypothyroidism    Migraine with aura    states she doesn't have a headache, just the aura   Mixed hyperlipidemia    Osteoarthritis of knee    Thyroid disease     PAST SURGICAL HISTORY: Past Surgical History:  Procedure Laterality Date   CHOLECYSTECTOMY     COLONOSCOPY  06/02/2014   cleared for 10 yrs- Dr Shelle Iron   COLONOSCOPY N/A 04/07/2017   Procedure: COLONOSCOPY;  Surgeon: Toney Reil, MD;  Location: [facility] ENDOSCOPY;  Service: Gastroenterology;  Laterality: N/A;   COLONOSCOPY WITH PROPOFOL N/A 11/04/2021   Procedure: COLONOSCOPY WITH PROPOFOL;  Surgeon: Wyline Mood, MD;  Location: [facility] ENDOSCOPY;  Service: Gastroenterology;  Laterality: N/A;   ESOPHAGEAL DILATION     ESOPHAGEAL DILATION  10/21/2021   Procedure: ESOPHAGEAL DILATION;  Surgeon: Midge Minium, MD;  Location: [facility];  Service: Endoscopy;;  12-18 mm   ESOPHAGOGASTRODUODENOSCOPY N/A 04/07/2017   Procedure: ESOPHAGOGASTRODUODENOSCOPY (EGD);  Surgeon: Toney Reil, MD;  Location: [facility] ENDOSCOPY;  Service: Gastroenterology;  Laterality: N/A;   ESOPHAGOGASTRODUODENOSCOPY N/A 10/21/2021   Procedure: ESOPHAGOGASTRODUODENOSCOPY (EGD);  Surgeon: Midge Minium, MD;  Location: [facility];  Service: Endoscopy;  Laterality: N/A;   HERNIA REPAIR  08/28/2014  estimate   TONSILLECTOMY     TUBAL LIGATION  1975   estimate   UPPER GI ENDOSCOPY  06/02/2014    FAMILY HISTORY: Family History  Problem Relation Age of Onset   Kidney disease Mother    Cancer Mother        brain   Arthritis  Mother    Obesity Mother    Varicose Veins Mother    Heart disease Father    Heart attack Father    Hearing loss Father    Hypertension Father    Lupus Sister    Diabetes Sister    Thyroid disease Sister    Rheum arthritis Sister    Arthritis Sister    Obesity Sister    Kidney disease Sister    Diabetes Sister    Arthritis Sister    Cancer Sister    Hearing loss Sister    Intellectual disability Sister    Learning disabilities Sister    Multiple myeloma Sister    Thyroid disease Sister    Rheum arthritis Sister    Arthritis Sister    Cancer Sister    Obesity Sister    Breast cancer Maternal Aunt    Miscarriages / [place] Daughter     ADVANCED DIRECTIVES (Y/N):  N  HEALTH MAINTENANCE: Social History   Tobacco Use   Smoking status: Never   Smokeless tobacco: Never   Tobacco comments:    Never, but father and husband smoked  Vaping Use   Vaping status: Never Used  Substance Use Topics   Alcohol use: Not Currently    Comment: on special occasions like New Years Eve   Drug use: Never     Colonoscopy:  PAP:  Bone density:  Lipid panel:  Allergies  Allergen Reactions   Lisinopril     cough   Codeine Nausea Only and Nausea And Vomiting    Current Outpatient Medications  Medication Sig Dispense Refill   alendronate (FOSAMAX) 70 MG tablet TAKE 1 TABLET EVERY 7 DAYS,TAKE WITH A FULL GLASS OF  WATER ON AN EMPTY STOMACH 12 tablet 0   cetirizine (ZYRTEC) 10 MG tablet Take 10 mg by mouth daily. otc     Cholecalciferol (VITAMIN D-3) 125 MCG (5000 UT) TABS Take 1 capsule by mouth daily.     EUTHYROX 88 MCG tablet TAKE 1 TABLET DAILY FIRST  THING IN THE MORNING ON AN EMPTY STOMACH 90 tablet 3   FIBER PO Take by mouth.     losartan (COZAAR) 100 MG tablet Take 100 mg by mouth daily.     lovastatin (MEVACOR) 20 MG tablet TAKE 1 TABLET AT BEDTIME 90 tablet 3   magnesium gluconate (MAGONATE) 500 MG tablet Take 250 mg by mouth daily.     montelukast (SINGULAIR) 10 MG  tablet TAKE 1 TABLET DAILY 90 tablet 3   No current facility-administered medications for this visit.    OBJECTIVE: Vitals:   07/03/23 1301  BP: 126/65  Pulse: 65  Resp: 16  Temp: 97.9 F (36.6 C)  SpO2: 98%     Body mass index is 29.17 kg/m.    ECOG FS:0 - Asymptomatic  General: Well-developed, well-nourished, no acute distress. Eyes: Pink conjunctiva, anicteric sclera. HEENT: Normocephalic, moist mucous membranes. Lungs: No audible wheezing or coughing. Heart: Regular rate and rhythm. Abdomen: Soft, nontender, no obvious distention. Musculoskeletal: No edema, cyanosis, or clubbing. Neuro: Alert, answering all questions appropriately. Cranial nerves grossly intact. Skin: No rashes or petechiae noted. Psych: Normal affect.  LAB  RESULTS:  Lab Results  Component Value Date   NA 135 07/03/2023   K PENDING 07/03/2023   CL 101 07/03/2023   CO2 27 07/03/2023   GLUCOSE 146 (H) 07/03/2023   BUN 18 07/03/2023   CREATININE 1.38 (H) 07/03/2023   CALCIUM 8.5 (L) 07/03/2023   PROT 7.3 07/03/2023   ALBUMIN 3.6 07/03/2023   AST 18 07/03/2023   ALT 10 07/03/2023   ALKPHOS 51 07/03/2023   BILITOT 1.1 07/03/2023   GFRNONAA 39 (L) 07/03/2023   GFRAA 50 (L) 08/20/2020    Lab Results  Component Value Date   WBC 9.3 07/03/2023   NEUTROABS 7.5 07/03/2023   HGB 11.0 (L) 07/03/2023   HCT 32.8 (L) 07/03/2023   MCV 100.0 07/03/2023   PLT 176 07/03/2023   Lab Results  Component Value Date   IRON 68 12/30/2022   TIBC 389 12/30/2022   IRONPCTSAT 18 12/30/2022   Lab Results  Component Value Date   FERRITIN 22 12/30/2022     STUDIES: No results found.  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  PLAN:    Anemia in chronic renal failure: Patient's hemoglobin is 11.0 today.  Iron stores continue to be within normal limits.  She does not require additional Retacrit today.  She last received treatment on December 30, 2022.  Return to clinic in 6 months with repeat laboratory  work and consideration of Retacrit if her hemoglobin falls below 10.0.  If patient does not require treatment at this visit, can consider discharging her from clinic.    Chronic renal insufficiency, stage IIIa:  Chronic and unchanged.  Patient's most recent GFR is 39.  Continue follow-up with nephrology as indicated.  I spent a total of 20 minutes reviewing chart data, face-to-face evaluation with the patient, counseling and coordination of care as detailed above.    Patient expressed understanding and was in agreement with this plan. She also understands that She can call clinic at any time with any questions, concerns, or complaints.    Jeralyn Ruths, MD   07/03/2023 1:36 PM

## 2023-07-27 DIAGNOSIS — K080 Exfoliation of teeth due to systemic causes: Secondary | ICD-10-CM | POA: Diagnosis not present

## 2023-08-03 DIAGNOSIS — N1832 Chronic kidney disease, stage 3b: Secondary | ICD-10-CM | POA: Diagnosis not present

## 2023-08-03 DIAGNOSIS — I10 Essential (primary) hypertension: Secondary | ICD-10-CM | POA: Diagnosis not present

## 2023-08-08 DIAGNOSIS — N1832 Chronic kidney disease, stage 3b: Secondary | ICD-10-CM | POA: Diagnosis not present

## 2023-08-08 DIAGNOSIS — I10 Essential (primary) hypertension: Secondary | ICD-10-CM | POA: Diagnosis not present

## 2023-08-08 DIAGNOSIS — D631 Anemia in chronic kidney disease: Secondary | ICD-10-CM | POA: Diagnosis not present

## 2023-08-08 DIAGNOSIS — N2581 Secondary hyperparathyroidism of renal origin: Secondary | ICD-10-CM | POA: Diagnosis not present

## 2023-08-27 ENCOUNTER — Other Ambulatory Visit: Payer: Self-pay | Admitting: Family Medicine

## 2023-08-27 ENCOUNTER — Encounter: Payer: Self-pay | Admitting: Family Medicine

## 2023-08-27 DIAGNOSIS — M816 Localized osteoporosis [Lequesne]: Secondary | ICD-10-CM

## 2023-08-27 DIAGNOSIS — M810 Age-related osteoporosis without current pathological fracture: Secondary | ICD-10-CM

## 2023-08-30 ENCOUNTER — Telehealth: Payer: Self-pay

## 2023-08-30 DIAGNOSIS — M810 Age-related osteoporosis without current pathological fracture: Secondary | ICD-10-CM

## 2023-08-30 DIAGNOSIS — M816 Localized osteoporosis [Lequesne]: Secondary | ICD-10-CM

## 2023-08-30 MED ORDER — ALENDRONATE SODIUM 70 MG PO TABS
ORAL_TABLET | ORAL | 0 refills | Status: DC
Start: 2023-08-30 — End: 2023-11-27

## 2023-08-30 NOTE — Telephone Encounter (Signed)
 Copied from CRM [id number]. Topic: Clinical - Prescription Issue >> Aug 30, 2023  3:31 PM Crispin Dolphin wrote: Reason for CRM: Patient called states she received a message from pharmacy to contact provider for prescription for alendronate (FOSAMAX) 70 MG tablet. States it was written by Dr Ava Lei because PCP was out and declined for needs appt. Patient would like to know if pcp will refill it. States she has had labs done in February and march that provider should be able to access and she thought she came in for a physical but I told her that appt was canceled and the appt that day was for the [facility]. Patient states she would rather not come in if she does not have to. Thank You

## 2023-09-12 ENCOUNTER — Telehealth: Payer: Self-pay | Admitting: Family Medicine

## 2023-09-12 DIAGNOSIS — E782 Mixed hyperlipidemia: Secondary | ICD-10-CM

## 2023-09-12 DIAGNOSIS — J309 Allergic rhinitis, unspecified: Secondary | ICD-10-CM

## 2023-09-12 DIAGNOSIS — E039 Hypothyroidism, unspecified: Secondary | ICD-10-CM

## 2023-09-12 NOTE — Telephone Encounter (Signed)
 Copied from CRM [id number]. Topic: Clinical - Medication Refill >> Sep 12, 2023  9:07 AM Rennis Case wrote: Most Recent Primary Care Visit:  Provider: Pinky Bright  Department: [facility]  Visit Type: MEDICARE WELL VISIT  Date: 05/25/2023  Medication: montelukast  (SINGULAIR ) 10 MG tablet lovastatin  (MEVACOR ) 20 MG tablet EUTHYROX  88 MCG tablet Patient now using [facility] due to insurance change at beginning of year. Requesting rx to be sent to pharmacy below.   Has the patient contacted their pharmacy? Yes Told to contact the office.   Is this the correct pharmacy for this prescription? Yes This is the patient's preferred pharmacy:  [facility] - [place], [place] - [address] [place] [address] Phone: [phone] Fax: [phone]   Has the prescription been filled recently? No  Is the patient out of the medication? Yes, out of the euthyrox  but has a few of the other two rx   Has the patient been seen for an appointment in the last year OR does the patient have an upcoming appointment? Yes  Can we respond through MyChart? No  Agent: Please be advised that Rx refills may take up to 3 business days. We ask that you follow-up with your pharmacy.

## 2023-09-14 MED ORDER — MONTELUKAST SODIUM 10 MG PO TABS: 10.0000 mg | ORAL_TABLET | Freq: Every day | ORAL | 0 refills | Status: DC

## 2023-09-14 MED ORDER — LOVASTATIN 20 MG PO TABS: 20.0000 mg | ORAL_TABLET | Freq: Every day | ORAL | 0 refills | Status: DC

## 2023-09-14 MED ORDER — LEVOTHYROXINE SODIUM 88 MCG PO TABS: 88.0000 ug | ORAL_TABLET | Freq: Every day | ORAL | 0 refills | Status: DC

## 2023-09-14 NOTE — Telephone Encounter (Signed)
 Last CPE/OV Nov. 2024, within protocol.  Requested Prescriptions  Pending Prescriptions Disp Refills   montelukast  (SINGULAIR ) 10 MG tablet 90 tablet 0    Sig: Take 1 tablet (10 mg total) by mouth daily.     Pulmonology:  Leukotriene Inhibitors Failed - 09/14/2023  2:05 PM      Failed - Valid encounter within last 12 months    Recent Outpatient Visits   None             lovastatin  (MEVACOR ) 20 MG tablet 90 tablet 0    Sig: Take 1 tablet (20 mg total) by mouth at bedtime.     Cardiovascular:  Antilipid - Statins 2 Failed - 09/14/2023  2:05 PM      Failed - Cr in normal range and within 360 days    Creatinine  Date Value Ref Range Status  07/03/2023 1.38 (H) 0.44 - 1.00 mg/dL Final   Creat  Date Value Ref Range Status  04/10/2023 1.31 (H) 0.60 - 1.00 mg/dL Final         Failed - Valid encounter within last 12 months    Recent Outpatient Visits   None            Failed - Lipid Panel in normal range within the last 12 months    Cholesterol, Total  Date Value Ref Range Status  01/22/2018 145 100 - 199 mg/dL Final   Cholesterol  Date Value Ref Range Status  04/10/2023 155 <200 mg/dL Final   LDL Cholesterol (Calc)  Date Value Ref Range Status  04/10/2023 69 mg/dL (calc) Final    Comment:    Reference range: <100 . Desirable range <100 mg/dL for primary prevention;   <70 mg/dL for patients with CHD or diabetic patients  with > or = 2 CHD risk factors. Aaron Aas LDL-C is now calculated using the Martin-Hopkins  calculation, which is a validated novel method providing  better accuracy than the Friedewald equation in the  estimation of LDL-C.  Melinda Sprawls et al. Erroll Heard. 1191;478(29): 2061-2068  (http://education.[employer].com/faq/FAQ164)    HDL  Date Value Ref Range Status  04/10/2023 66 > OR = 50 mg/dL Final  56/21/3086 64 >57 mg/dL Final   Triglycerides  Date Value Ref Range Status  04/10/2023 117 <150 mg/dL Final         Passed - Patient is not pregnant        levothyroxine  (EUTHYROX ) 88 MCG tablet 90 tablet 0    Sig: Take 1 tablet (88 mcg total) by mouth daily before breakfast.     Endocrinology:  Hypothyroid Agents Failed - 09/14/2023  2:05 PM      Failed - Valid encounter within last 12 months    Recent Outpatient Visits   None            Passed - TSH in normal range and within 360 days    TSH  Date Value Ref Range Status  04/10/2023 1.84 0.40 - 4.50 mIU/L Final

## 2023-09-18 DIAGNOSIS — K080 Exfoliation of teeth due to systemic causes: Secondary | ICD-10-CM | POA: Diagnosis not present

## 2023-09-22 ENCOUNTER — Ambulatory Visit (INDEPENDENT_AMBULATORY_CARE_PROVIDER_SITE_OTHER): Admitting: Nurse Practitioner

## 2023-09-22 ENCOUNTER — Ambulatory Visit
Admission: RE | Admit: 2023-09-22 | Discharge: 2023-09-22 | Disposition: A | Discharge status: Home / Self Care | Source: Ambulatory Visit | Attending: Nurse Practitioner

## 2023-09-22 ENCOUNTER — Encounter: Payer: Self-pay | Admitting: Nurse Practitioner

## 2023-09-22 VITALS — BP 128/64 | HR 80 | Resp 16 | Ht 62.0 in | Wt 159.0 lb

## 2023-09-22 DIAGNOSIS — M79601 Pain in right arm: Secondary | ICD-10-CM | POA: Insufficient documentation

## 2023-09-22 DIAGNOSIS — E039 Hypothyroidism, unspecified: Secondary | ICD-10-CM

## 2023-09-22 MED ORDER — LEVOTHYROXINE SODIUM 88 MCG PO TABS: 88.0000 ug | ORAL_TABLET | Freq: Every day | ORAL | 1 refills | Status: DC

## 2023-09-22 NOTE — Progress Notes (Signed)
 BP 128/64   Pulse 80   Resp 16   Ht 5\' 2"  (1.575 m)   Wt 159 lb (72.1 kg)   SpO2 95%   BMI 29.08 kg/m    Subjective:    Patient ID: Pamela Hendrix, female    DOB: 07-17-1945, 78 y.o.   MRN: [id number]  HPI: Pamela Hendrix is a 78 y.o. female  Chief Complaint  Patient presents with   Arm Pain    Right, comes/goes since "middle of march"   Medication Refill    Discussed the use of AI scribe software for clinical note transcription with the patient, who gave verbal consent to proceed.  History of Present Illness Pamela Hendrix is a 78 year old female who presents with right arm pain following a blood draw in March.  She has been experiencing intermittent right arm pain since a blood draw in mid-March. The pain is triggered by certain movements, such as turning her arm while brushing her teeth, and can wake her at night if her arm is bent during sleep. The pain does not typically last all day. No numbness, tingling, or swelling in the arm. Initially, there was a knot at the site of the blood draw, but this has resolved. She avoids taking medication for the pain due to kidney problems and tries to avoid ibuprofen unless necessary.  She has a history of hypothyroidism and is currently on Euthyrox  for thyroid  management, having previously been on levothyroxine . She reports that now it is expensive. Will change to levothyroxine .     Relevant past medical, surgical, family and social history reviewed and updated as indicated. Interim medical history since our last visit reviewed. Allergies and medications reviewed and updated.  Review of Systems  Constitutional: Negative for fever or weight change.  Respiratory: Negative for cough and shortness of breath.   Cardiovascular: Negative for chest pain or palpitations.  Gastrointestinal: Negative for abdominal pain, no bowel changes.  Musculoskeletal: Negative for gait problem or joint swelling.  Skin:  Negative for rash.  Neurological: Negative for dizziness or headache.  No other specific complaints in a complete review of systems (except as listed in HPI above).      Objective:      BP 128/64   Pulse 80   Resp 16   Ht 5\' 2"  (1.575 m)   Wt 159 lb (72.1 kg)   SpO2 95%   BMI 29.08 kg/m    Wt Readings from Last 3 Encounters:  09/22/23 159 lb (72.1 kg)  07/03/23 159 lb 8 oz (72.3 kg)  04/10/23 157 lb 11.2 oz (71.5 kg)    Physical Exam Vitals reviewed.  Constitutional:      Appearance: Normal appearance.  HENT:     Head: Normocephalic.  Cardiovascular:     Rate and Rhythm: Normal rate and regular rhythm.  Pulmonary:     Effort: Pulmonary effort is normal.     Breath sounds: Normal breath sounds.  Musculoskeletal:        General: Tenderness present. Normal range of motion.     Comments: Antecubital space  Skin:    General: Skin is warm and dry.  Neurological:     General: No focal deficit present.     Mental Status: She is alert and oriented to person, place, and time. Mental status is at baseline.  Psychiatric:        Mood and Affect: Mood normal.        Behavior: Behavior normal.  Thought Content: Thought content normal.        Judgment: Judgment normal.       Results for orders placed or performed in visit on 07/03/23  Iron and TIBC([facility])   Collection Time: 07/03/23 12:32 PM  Result Value Ref Range   Iron 91 28 - 170 ug/dL   TIBC 161 096 - 045 ug/dL   Saturation Ratios 21 10.4 - 31.8 %   UIBC 353 ug/dL  Ferritin   Collection Time: 07/03/23 12:32 PM  Result Value Ref Range   Ferritin 18 11 - 307 ng/mL  CBC with Differential/Platelet   Collection Time: 07/03/23 12:32 PM  Result Value Ref Range   WBC 9.3 4.0 - 10.5 K/uL   RBC 3.28 (L) 3.87 - 5.11 MIL/uL   Hemoglobin 11.0 (L) 12.0 - 15.0 g/dL   HCT 40.9 (L) 81.1 - 91.4 %   MCV 100.0 80.0 - 100.0 fL   MCH 33.5 26.0 - 34.0 pg   MCHC 33.5 30.0 - 36.0 g/dL   RDW 78.2 95.6 - 21.3 %    Platelets 176 150 - 400 K/uL   nRBC 0.2 0.0 - 0.2 %   Neutrophils Relative % 79 %   Neutro Abs 7.5 1.7 - 7.7 K/uL   Lymphocytes Relative 13 %   Lymphs Abs 1.2 0.7 - 4.0 K/uL   Monocytes Relative 5 %   Monocytes Absolute 0.5 0.1 - 1.0 K/uL   Eosinophils Relative 1 %   Eosinophils Absolute 0.1 0.0 - 0.5 K/uL   Basophils Relative 1 %   Basophils Absolute 0.1 0.0 - 0.1 K/uL   Immature Granulocytes 1 %   Abs Immature Granulocytes 0.06 0.00 - 0.07 K/uL  CMP (Cancer Center only)   Collection Time: 07/03/23 12:33 PM  Result Value Ref Range   Sodium 135 135 - 145 mmol/L   Potassium 4.4 3.5 - 5.1 mmol/L   Chloride 101 98 - 111 mmol/L   CO2 27 22 - 32 mmol/L   Glucose, Bld 146 (H) 70 - 99 mg/dL   BUN 18 8 - 23 mg/dL   Creatinine 0.86 (H) 5.78 - 1.00 mg/dL   Calcium 8.5 (L) 8.9 - 10.3 mg/dL   Total Protein 7.3 6.5 - 8.1 g/dL   Albumin 3.6 3.5 - 5.0 g/dL   AST 18 15 - 41 U/L   ALT 10 0 - 44 U/L   Alkaline Phosphatase 51 38 - 126 U/L   Total Bilirubin 1.1 0.0 - 1.2 mg/dL   GFR, Estimated 39 (L) >60 mL/min   Anion gap 7 5 - 15          Assessment & Plan:   Problem List Items Addressed This Visit       Endocrine   Hypothyroid   Relevant Medications   levothyroxine  (SYNTHROID ) 88 MCG tablet   Other Visit Diagnoses       Right arm pain    -  Primary   Relevant Orders   US  Venous Img Upper Uni Right(DVT)        Assessment and Plan Assessment & Plan Arm pain post blood draw Intermittent right arm pain since mid-March following a blood draw, exacerbated by movements such as brushing teeth or twisting the arm. No numbness, tingling, or swelling. Initial concern about potential nerve injury or complications from the blood draw. No analgesics taken due to renal issues. Likely benign, but further investigation is warranted to rule out serious underlying issues. - Order right upper extremity ultrasound to evaluate  the cause of arm pain.  Hypothyroidism Currently on Euthyrox   (levothyroxine ) for hypothyroidism. Reports issues with medication refills and insurance coverage, requesting a switch back to levothyroxine  due to cost concerns. No symptoms related to hypothyroidism discussed during the visit. - Switch prescription from Euthyrox  to levothyroxine  for hypothyroidism management.        Follow up plan: Return if symptoms worsen or fail to improve.

## 2023-09-25 ENCOUNTER — Encounter: Payer: Self-pay | Admitting: Nurse Practitioner

## 2023-11-24 ENCOUNTER — Other Ambulatory Visit: Payer: Self-pay | Admitting: Family Medicine

## 2023-11-24 DIAGNOSIS — E039 Hypothyroidism, unspecified: Secondary | ICD-10-CM

## 2023-11-24 NOTE — Telephone Encounter (Unsigned)
 Copied from CRM [id number]. Topic: Clinical - Medication Refill >> Nov 24, 2023  9:47 AM [name] wrote: Medication: levothyroxine  (SYNTHROID ) 88 MCG tablet  Has the patient contacted their pharmacy? Yes (Agent: If no, request that the patient contact the pharmacy for the refill. If patient does not wish to contact the pharmacy document the reason why and proceed with request.) (Agent: If yes, when and what did the pharmacy advise?)  This is the patient's preferred pharmacy:  [facility] - [place], [place] - [address] [place] [address] Phone: [phone] Fax: [phone]  Is this the correct pharmacy for this prescription? Yes If no, delete pharmacy and type the correct one.   Has the prescription been filled recently? No  Is the patient out of the medication? Yes  Has the patient been seen for an appointment in the last year OR does the patient have an upcoming appointment? Yes  Can we respond through MyChart? Yes  Agent: Please be advised that Rx refills may take up to 3 business days. We ask that you follow-up with your pharmacy.

## 2023-11-27 ENCOUNTER — Telehealth: Payer: Self-pay

## 2023-11-27 ENCOUNTER — Telehealth: Payer: Self-pay | Admitting: Family Medicine

## 2023-11-27 ENCOUNTER — Other Ambulatory Visit: Payer: Self-pay | Admitting: Family Medicine

## 2023-11-27 DIAGNOSIS — M816 Localized osteoporosis [Lequesne]: Secondary | ICD-10-CM

## 2023-11-27 DIAGNOSIS — M810 Age-related osteoporosis without current pathological fracture: Secondary | ICD-10-CM

## 2023-11-27 DIAGNOSIS — E782 Mixed hyperlipidemia: Secondary | ICD-10-CM

## 2023-11-27 DIAGNOSIS — J309 Allergic rhinitis, unspecified: Secondary | ICD-10-CM

## 2023-11-27 MED ORDER — ALENDRONATE SODIUM 70 MG PO TABS: ORAL_TABLET | ORAL | 0 refills | Status: DC

## 2023-11-27 MED ORDER — LEVOTHYROXINE SODIUM 88 MCG PO TABS: 88.0000 ug | ORAL_TABLET | Freq: Every day | ORAL | 0 refills | Status: DC

## 2023-11-27 NOTE — Telephone Encounter (Signed)
Refill sent in today

## 2023-11-27 NOTE — Telephone Encounter (Signed)
Rx have been refilled

## 2023-11-27 NOTE — Telephone Encounter (Signed)
 Requested Prescriptions  Pending Prescriptions Disp Refills   lovastatin  (MEVACOR ) 20 MG tablet [Pharmacy Med Name: LOVASTATIN  TABS 20MG ] 90 tablet 0    Sig: TAKE 1 TABLET AT BEDTIME     Cardiovascular:  Antilipid - Statins 2 Failed - 11/27/2023 11:15 AM      Failed - Cr in normal range and within 360 days    Creatinine  Date Value Ref Range Status  07/03/2023 1.38 (H) 0.44 - 1.00 mg/dL Final   Creat  Date Value Ref Range Status  04/10/2023 1.31 (H) 0.60 - 1.00 mg/dL Final         Failed - Lipid Panel in normal range within the last 12 months    Cholesterol, Total  Date Value Ref Range Status  01/22/2018 145 100 - 199 mg/dL Final   Cholesterol  Date Value Ref Range Status  04/10/2023 155 <200 mg/dL Final   LDL Cholesterol (Calc)  Date Value Ref Range Status  04/10/2023 69 mg/dL (calc) Final    Comment:    Reference range: <100 . Desirable range <100 mg/dL for primary prevention;   <70 mg/dL for patients with CHD or diabetic patients  with > or = 2 CHD risk factors. SABRA LDL-C is now calculated using the Martin-Hopkins  calculation, which is a validated novel method providing  better accuracy than the Friedewald equation in the  estimation of LDL-C.  Gladis APPLETHWAITE et al. SANDREA. 7986;689(80): 2061-2068  (http://education.Quest[facility].com/faq/FAQ164)    HDL  Date Value Ref Range Status  04/10/2023 66 > OR = 50 mg/dL Final  90/90/7980 64 >60 mg/dL Final   Triglycerides  Date Value Ref Range Status  04/10/2023 117 <150 mg/dL Final         Passed - Patient is not pregnant      Passed - Valid encounter within last 12 months    Recent Outpatient Visits           2 months ago Right arm pain   [facility] Gareth Mliss [place], FNP               montelukast  (SINGULAIR ) 10 MG tablet [Pharmacy Med Name: MONTELUKAST  SODIUM TABS 10MG ] 90 tablet 0    Sig: TAKE 1 TABLET DAILY     Pulmonology:  Leukotriene Inhibitors Passed - 11/27/2023 11:15 AM       Passed - Valid encounter within last 12 months    Recent Outpatient Visits           2 months ago Right arm pain   [facility] Gareth Mliss F, FNP               alendronate  (FOSAMAX ) 70 MG tablet 12 tablet 0    Sig: TAKE 1 TABLET EVERY 7 DAYS,TAKE WITH A FULL GLASS OF  WATER  ON AN EMPTY STOMACH     Endocrinology:  Bisphosphonates Failed - 11/27/2023 11:15 AM      Failed - Ca in normal range and within 360 days    Calcium  Date Value Ref Range Status  07/03/2023 8.5 (L) 8.9 - 10.3 mg/dL Final   Calcium, Total  Date Value Ref Range Status  12/05/2013 9.1 8.5 - 10.1 mg/dL Final         Failed - Cr in normal range and within 360 days    Creatinine  Date Value Ref Range Status  07/03/2023 1.38 (H) 0.44 - 1.00 mg/dL Final   Creat  Date Value Ref Range Status  04/10/2023  1.31 (H) 0.60 - 1.00 mg/dL Final         Failed - Mg Level in normal range and within 360 days    Magnesium  Date Value Ref Range Status  09/03/2019 2.2 1.5 - 2.5 mg/dL Final         Failed - Phosphate in normal range and within 360 days    Phosphorus  Date Value Ref Range Status  09/07/2016 3.7 2.5 - 4.5 mg/dL Final         Passed - Vitamin D  in normal range and within 360 days    Vit D, 25-Hydroxy  Date Value Ref Range Status  12/21/2022 72 30 - 100 ng/mL Final    Comment:    Vitamin D  Status         25-OH Vitamin D : . Deficiency:                    <20 ng/mL Insufficiency:             20 - 29 ng/mL Optimal:                 > or = 30 ng/mL . For 25-OH Vitamin D  testing on patients on  D2-supplementation and patients for whom quantitation  of D2 and D3 fractions is required, the QuestAssureD(TM) 25-OH VIT D, (D2,D3), LC/MS/MS is recommended: order  code 07111 (patients >14yrs). . See Note 1 . Note 1 . For additional information, please refer to  http://education.[facility].com/faq/FAQ199  (This link is being provided for  informational/ educational purposes only.)          Passed - eGFR is 30 or above and within 360 days    GFR, Est African American  Date Value Ref Range Status  08/20/2020 50 (L) > OR = 60 mL/min/1.28m2 Final   GFR, Est Non African American  Date Value Ref Range Status  08/20/2020 43 (L) > OR = 60 mL/min/1.3m2 Final   GFR, Estimated  Date Value Ref Range Status  07/03/2023 39 (L) >60 mL/min Final    Comment:    (NOTE) Calculated using the CKD-EPI Creatinine Equation (2021)    eGFR  Date Value Ref Range Status  04/10/2023 42 (L) > OR = 60 mL/min/1.77m2 Final         Passed - Valid encounter within last 12 months    Recent Outpatient Visits           2 months ago Right arm pain   [facility] Gareth Mliss [place], [place]              Passed - Bone Mineral Density or Dexa Scan completed in the last 2 years

## 2023-11-27 NOTE — Telephone Encounter (Signed)
 Copied from CRM [id number]. Topic: Clinical - Medication Refill >> Nov 24, 2023  9:47 AM [name] wrote: Medication: levothyroxine  (SYNTHROID ) 88 MCG tablet  Has the patient contacted their pharmacy? Yes (Agent: If no, request that the patient contact the pharmacy for the refill. If patient does not wish to contact the pharmacy document the reason why and proceed with request.) (Agent: If yes, when and what did the pharmacy advise?)  This is the patient's preferred pharmacy:  [facility] - [name], [place] - [address] [place] [address] Phone: [phone] Fax: [phone]  Is this the correct pharmacy for this prescription? Yes If no, delete pharmacy and type the correct one.   Has the prescription been filled recently? No  Is the patient out of the medication? Yes  Has the patient been seen for an appointment in the last year OR does the patient have an upcoming appointment? Yes  Can we respond through MyChart? Yes  Agent: Please be advised that Rx refills may take up to 3 business days. We ask that you follow-up with your pharmacy. >> Nov 27, 2023  9:35 AM [name] wrote: Patient calling for an update stated she was informed she would get a callback, but hasn't heard anything yet. Advised her that it could take up to 3 days not including the weekend days, after looking at the chart further it looks like the refill requests are not being routed to the clinic its being routed back to [facility] nurse pool, after calling [facility] to confirm I spoke with Cassandra who stated it did in fact not make its way to the clinic she advised for me to route it directly to the clinical pool.

## 2023-11-27 NOTE — Telephone Encounter (Signed)
 Copied from CRM [id number]. Topic: Clinical - Medication Refill >> Nov 27, 2023  9:25 AM [name] wrote: Medication: alendronate  (FOSAMAX ) 70 MG tablet  Has the patient contacted their pharmacy? Yes (Agent: If no, request that the patient contact the pharmacy for the refill. If patient does not wish to contact the pharmacy document the reason why and proceed with request.) (Agent: If yes, when and what did the pharmacy advise?)  This is the patient's preferred pharmacy:  [facility] - [name], [place] - [address] [place] [address] Phone: [phone] Fax: [phone]  Is this the correct pharmacy for this prescription? Yes If no, delete pharmacy and type the correct one.   Has the prescription been filled recently? No  Is the patient out of the medication? Yes  Has the patient been seen for an appointment in the last year OR does the patient have an upcoming appointment? Yes  Can we respond through MyChart? Yes  Agent: Please be advised that Rx refills may take up to 3 business days. We ask that you follow-up with your pharmacy.

## 2023-11-27 NOTE — Telephone Encounter (Signed)
 Requested Prescriptions  Pending Prescriptions Disp Refills   levothyroxine  (SYNTHROID ) 88 MCG tablet 90 tablet 1    Sig: Take 1 tablet (88 mcg total) by mouth daily.     Endocrinology:  Hypothyroid Agents Passed - 11/27/2023 11:13 AM      Passed - TSH in normal range and within 360 days    TSH  Date Value Ref Range Status  04/10/2023 1.84 0.40 - 4.50 mIU/L Final         Passed - Valid encounter within last 12 months    Recent Outpatient Visits           2 months ago Right arm pain   [facility] Gareth Mliss [place], [place]

## 2023-11-28 ENCOUNTER — Telehealth: Payer: Self-pay

## 2023-11-28 NOTE — Telephone Encounter (Signed)
 Copied from CRM [id number]. Topic: Clinical - Medication Question >> Nov 28, 2023  9:46 AM Silvana PARAS wrote: Reason for CRM: [facility] calling to verify information regarding pt's levothyroxine  (SYNTHROID ) 88 MCG tablet medication. Spoke with clinic and nurse has information to give a callback reference: [id number]. [phone].

## 2023-11-28 NOTE — Telephone Encounter (Signed)
 Called and notified

## 2023-11-28 NOTE — Telephone Encounter (Signed)
 Copied from CRM [id number]. Topic: Clinical - Medication Prior Auth >> Nov 27, 2023  5:13 PM Tiffini S wrote: Reason for CRM: [place] with [facility] [phone] called asking to change the manufacturer for the medication refill levothyroxine  (SYNTHROID ) 88 MCG tablet/ the call dropped before obtaining the reference number for this request.

## 2023-12-11 DIAGNOSIS — I10 Essential (primary) hypertension: Secondary | ICD-10-CM | POA: Diagnosis not present

## 2023-12-11 DIAGNOSIS — N2581 Secondary hyperparathyroidism of renal origin: Secondary | ICD-10-CM | POA: Diagnosis not present

## 2023-12-11 DIAGNOSIS — N1832 Chronic kidney disease, stage 3b: Secondary | ICD-10-CM | POA: Diagnosis not present

## 2023-12-11 DIAGNOSIS — D631 Anemia in chronic kidney disease: Secondary | ICD-10-CM | POA: Diagnosis not present

## 2023-12-14 DIAGNOSIS — N1832 Chronic kidney disease, stage 3b: Secondary | ICD-10-CM | POA: Diagnosis not present

## 2023-12-14 DIAGNOSIS — N2581 Secondary hyperparathyroidism of renal origin: Secondary | ICD-10-CM | POA: Diagnosis not present

## 2023-12-14 DIAGNOSIS — D631 Anemia in chronic kidney disease: Secondary | ICD-10-CM | POA: Diagnosis not present

## 2023-12-14 DIAGNOSIS — I10 Essential (primary) hypertension: Secondary | ICD-10-CM | POA: Diagnosis not present

## 2023-12-16 ENCOUNTER — Encounter: Payer: Self-pay | Admitting: Oncology

## 2023-12-19 DIAGNOSIS — D2261 Melanocytic nevi of right upper limb, including shoulder: Secondary | ICD-10-CM | POA: Diagnosis not present

## 2023-12-19 DIAGNOSIS — D225 Melanocytic nevi of trunk: Secondary | ICD-10-CM | POA: Diagnosis not present

## 2023-12-19 DIAGNOSIS — D2272 Melanocytic nevi of left lower limb, including hip: Secondary | ICD-10-CM | POA: Diagnosis not present

## 2023-12-19 DIAGNOSIS — D2262 Melanocytic nevi of left upper limb, including shoulder: Secondary | ICD-10-CM | POA: Diagnosis not present

## 2024-01-01 ENCOUNTER — Other Ambulatory Visit: Payer: Medicare Other

## 2024-01-01 ENCOUNTER — Ambulatory Visit: Payer: Medicare Other | Admitting: Oncology

## 2024-01-01 ENCOUNTER — Ambulatory Visit: Payer: Medicare Other

## 2024-01-22 DIAGNOSIS — Q2112 Patent foramen ovale: Secondary | ICD-10-CM | POA: Diagnosis not present

## 2024-01-22 DIAGNOSIS — I10 Essential (primary) hypertension: Secondary | ICD-10-CM | POA: Diagnosis not present

## 2024-01-22 DIAGNOSIS — I340 Nonrheumatic mitral (valve) insufficiency: Secondary | ICD-10-CM | POA: Diagnosis not present

## 2024-01-22 DIAGNOSIS — E782 Mixed hyperlipidemia: Secondary | ICD-10-CM | POA: Diagnosis not present

## 2024-01-30 ENCOUNTER — Other Ambulatory Visit: Payer: Self-pay | Admitting: Family Medicine

## 2024-01-30 DIAGNOSIS — M816 Localized osteoporosis [Lequesne]: Secondary | ICD-10-CM

## 2024-01-30 DIAGNOSIS — M810 Age-related osteoporosis without current pathological fracture: Secondary | ICD-10-CM

## 2024-01-31 NOTE — Telephone Encounter (Signed)
 Requested Prescriptions  Pending Prescriptions Disp Refills   alendronate  (FOSAMAX ) 70 MG tablet [Pharmacy Med Name: ALENDRONATE  SODIUM TABS 4'S 70MG ] 12 tablet 3    Sig: TAKE 1 TABLET EVERY 7 DAYS WITH A FULL GLASS OF WATER  ON AN EMPTY STOMACH     Endocrinology:  Bisphosphonates Failed - 01/31/2024 12:24 PM      Failed - Ca in normal range and within 360 days    Calcium  Date Value Ref Range Status  07/03/2023 8.5 (L) 8.9 - 10.3 mg/dL Final   Calcium, Total  Date Value Ref Range Status  12/05/2013 9.1 8.5 - 10.1 mg/dL Final         Failed - Vitamin D  in normal range and within 360 days    Vit D, 25-Hydroxy  Date Value Ref Range Status  12/21/2022 72 30 - 100 ng/mL Final    Comment:    Vitamin D  Status         25-OH Vitamin D : . Deficiency:                    <20 ng/mL Insufficiency:             20 - 29 ng/mL Optimal:                 > or = 30 ng/mL . For 25-OH Vitamin D  testing on patients on  D2-supplementation and patients for whom quantitation  of D2 and D3 fractions is required, the QuestAssureD(TM) 25-OH VIT D, (D2,D3), LC/MS/MS is recommended: order  code 07111 (patients >40yrs). . See Note 1 . Note 1 . For additional information, please refer to  http://education.[facility].com/faq/FAQ199  (This link is being provided for informational/ educational purposes only.)          Failed - Cr in normal range and within 360 days    Creatinine  Date Value Ref Range Status  07/03/2023 1.38 (H) 0.44 - 1.00 mg/dL Final   Creat  Date Value Ref Range Status  04/10/2023 1.31 (H) 0.60 - 1.00 mg/dL Final         Failed - Mg Level in normal range and within 360 days    Magnesium  Date Value Ref Range Status  09/03/2019 2.2 1.5 - 2.5 mg/dL Final         Failed - Phosphate in normal range and within 360 days    Phosphorus  Date Value Ref Range Status  09/07/2016 3.7 2.5 - 4.5 mg/dL Final         Passed - eGFR is 30 or above and within 360 days    GFR, Est  African American  Date Value Ref Range Status  08/20/2020 50 (L) > OR = 60 mL/min/1.67m2 Final   GFR, Est Non African American  Date Value Ref Range Status  08/20/2020 43 (L) > OR = 60 mL/min/1.17m2 Final   GFR, Estimated  Date Value Ref Range Status  07/03/2023 39 (L) >60 mL/min Final    Comment:    (NOTE) Calculated using the CKD-EPI Creatinine Equation (2021)    eGFR  Date Value Ref Range Status  04/10/2023 42 (L) > OR = 60 mL/min/1.73m2 Final         Passed - Valid encounter within last 12 months    Recent Outpatient Visits           4 months ago Right arm pain   [facility] Gareth Mliss [place], [place]  Passed - Bone Mineral Density or Dexa Scan completed in the last 2 years

## 2024-02-14 ENCOUNTER — Ambulatory Visit (INDEPENDENT_AMBULATORY_CARE_PROVIDER_SITE_OTHER)

## 2024-02-14 DIAGNOSIS — Z23 Encounter for immunization: Secondary | ICD-10-CM

## 2024-02-14 NOTE — Progress Notes (Signed)
 Patient is in office today for a nurse visit for Flu Immunization. Patient Injection was given in the  Left deltoid. Patient tolerated injection well.

## 2024-02-26 ENCOUNTER — Other Ambulatory Visit: Payer: Self-pay | Admitting: Family Medicine

## 2024-02-26 DIAGNOSIS — E782 Mixed hyperlipidemia: Secondary | ICD-10-CM

## 2024-02-26 DIAGNOSIS — J309 Allergic rhinitis, unspecified: Secondary | ICD-10-CM

## 2024-02-27 NOTE — Telephone Encounter (Signed)
 Requested Prescriptions  Pending Prescriptions Disp Refills   montelukast  (SINGULAIR ) 10 MG tablet [Pharmacy Med Name: MONTELUKAST  SODIUM TABS 10MG ] 90 tablet 0    Sig: TAKE 1 TABLET DAILY     Pulmonology:  Leukotriene Inhibitors Passed - 02/27/2024  3:59 PM      Passed - Valid encounter within last 12 months    Recent Outpatient Visits           5 months ago Right arm pain   [facility] Gareth Mliss [place], FNP               lovastatin  (MEVACOR ) 20 MG tablet [Pharmacy Med Name: LOVASTATIN  TABS 20MG ] 90 tablet 0    Sig: TAKE 1 TABLET AT BEDTIME     Cardiovascular:  Antilipid - Statins 2 Failed - 02/27/2024  3:59 PM      Failed - Cr in normal range and within 360 days    Creatinine  Date Value Ref Range Status  07/03/2023 1.38 (H) 0.44 - 1.00 mg/dL Final   Creat  Date Value Ref Range Status  04/10/2023 1.31 (H) 0.60 - 1.00 mg/dL Final         Failed - Lipid Panel in normal range within the last 12 months    Cholesterol, Total  Date Value Ref Range Status  01/22/2018 145 100 - 199 mg/dL Final   Cholesterol  Date Value Ref Range Status  04/10/2023 155 <200 mg/dL Final   LDL Cholesterol (Calc)  Date Value Ref Range Status  04/10/2023 69 mg/dL (calc) Final    Comment:    Reference range: <100 . Desirable range <100 mg/dL for primary prevention;   <70 mg/dL for patients with CHD or diabetic patients  with > or = 2 CHD risk factors. SABRA LDL-C is now calculated using the Martin-Hopkins  calculation, which is a validated novel method providing  better accuracy than the Friedewald equation in the  estimation of LDL-C.  Gladis APPLETHWAITE et al. SANDREA. 7986;689(80): 2061-2068  (http://education.[facility].com/faq/FAQ164)    HDL  Date Value Ref Range Status  04/10/2023 66 > OR = 50 mg/dL Final  90/90/7980 64 >60 mg/dL Final   Triglycerides  Date Value Ref Range Status  04/10/2023 117 <150 mg/dL Final         Passed - Patient is not  pregnant      Passed - Valid encounter within last 12 months    Recent Outpatient Visits           5 months ago Right arm pain   [facility] Gareth Mliss [place], [place]

## 2024-03-29 ENCOUNTER — Other Ambulatory Visit: Payer: Self-pay | Admitting: Family Medicine

## 2024-03-29 DIAGNOSIS — Z1231 Encounter for screening mammogram for malignant neoplasm of breast: Secondary | ICD-10-CM

## 2024-04-25 ENCOUNTER — Other Ambulatory Visit: Payer: Self-pay | Admitting: Family Medicine

## 2024-04-25 DIAGNOSIS — M810 Age-related osteoporosis without current pathological fracture: Secondary | ICD-10-CM

## 2024-04-25 DIAGNOSIS — M816 Localized osteoporosis [Lequesne]: Secondary | ICD-10-CM

## 2024-04-25 NOTE — Telephone Encounter (Unsigned)
 Copied from CRM #[id number]. Topic: Clinical - Medication Refill >> Apr 25, 2024 11:27 AM Nathanel BROCKS wrote: Medication: alendronate  (FOSAMAX ) 70 MG tablet   Has the patient contacted their pharmacy? Yes   This is the patient's preferred pharmacy:  [facility] - [name], [place] - [address] [place] [address] Phone: [phone] Fax: [phone]  Is this the correct pharmacy for this prescription? Yes If no, delete pharmacy and type the correct one.   Has the prescription been filled recently? Yes  Is the patient out of the medication? Yes  Has the patient been seen for an appointment in the last year OR does the patient have an upcoming appointment? Yes  Can we respond through MyChart? Yes  Agent: Please be advised that Rx refills may take up to 3 business days. We ask that you follow-up with your pharmacy.   ----------------------------------------------------------------------- From previous Reason for Contact - Prescription Issue: Reason for CRM:

## 2024-04-29 MED ORDER — ALENDRONATE SODIUM 70 MG PO TABS: ORAL_TABLET | ORAL | 0 refills | Status: AC

## 2024-04-29 NOTE — Telephone Encounter (Signed)
 Requested medication (s) are due for refill today: yes  Requested medication (s) are on the active medication list: yes  Last refill:  01/21/24  Future visit scheduled: no  Notes to clinic:  Unable to refill per protocol due to failed labs, no updated results.      Requested Prescriptions  Pending Prescriptions Disp Refills   alendronate  (FOSAMAX ) 70 MG tablet 12 tablet 0    Sig: TAKE 1 TABLET EVERY 7 DAYS WITH A FULL GLASS OF WATER  ON AN EMPTY STOMACH     Endocrinology:  Bisphosphonates Failed - 04/29/2024  8:35 AM      Failed - Ca in normal range and within 360 days    Calcium  Date Value Ref Range Status  07/03/2023 8.5 (L) 8.9 - 10.3 mg/dL Final   Calcium, Total  Date Value Ref Range Status  12/05/2013 9.1 8.5 - 10.1 mg/dL Final         Failed - Vitamin D  in normal range and within 360 days    Vit D, 25-Hydroxy  Date Value Ref Range Status  12/21/2022 72 30 - 100 ng/mL Final    Comment:    Vitamin D  Status         25-OH Vitamin D : . Deficiency:                    <20 ng/mL Insufficiency:             20 - 29 ng/mL Optimal:                 > or = 30 ng/mL . For 25-OH Vitamin D  testing on patients on  D2-supplementation and patients for whom quantitation  of D2 and D3 fractions is required, the QuestAssureD(TM) 25-OH VIT D, (D2,D3), LC/MS/MS is recommended: order  code 07111 (patients >24yrs). . See Note 1 . Note 1 . For additional information, please refer to  http://education.[facility].com/faq/FAQ199  (This link is being provided for informational/ educational purposes only.)          Failed - Cr in normal range and within 360 days    Creatinine  Date Value Ref Range Status  07/03/2023 1.38 (H) 0.44 - 1.00 mg/dL Final   Creat  Date Value Ref Range Status  04/10/2023 1.31 (H) 0.60 - 1.00 mg/dL Final         Failed - Mg Level in normal range and within 360 days    Magnesium  Date Value Ref Range Status  09/03/2019 2.2 1.5 - 2.5 mg/dL Final          Failed - Phosphate in normal range and within 360 days    Phosphorus  Date Value Ref Range Status  09/07/2016 3.7 2.5 - 4.5 mg/dL Final         Failed - Bone Mineral Density or Dexa Scan completed in the last 2 years      Passed - eGFR is 30 or above and within 360 days    GFR, Est African American  Date Value Ref Range Status  08/20/2020 50 (L) > OR = 60 mL/min/1.40m2 Final   GFR, Est Non African American  Date Value Ref Range Status  08/20/2020 43 (L) > OR = 60 mL/min/1.84m2 Final   GFR, Estimated  Date Value Ref Range Status  07/03/2023 39 (L) >60 mL/min Final    Comment:    (NOTE) Calculated using the CKD-EPI Creatinine Equation (2021)    eGFR  Date Value Ref Range Status  04/10/2023  42 (L) > OR = 60 mL/min/1.63m2 Final         Passed - Valid encounter within last 12 months    Recent Outpatient Visits           7 months ago Right arm pain   [facility] Gareth Mliss [place], [place]

## 2024-05-06 ENCOUNTER — Ambulatory Visit
Admission: RE | Admit: 2024-05-06 | Discharge: 2024-05-06 | Disposition: A | Discharge status: Home / Self Care | Source: Ambulatory Visit | Attending: Family Medicine | Admitting: Family Medicine

## 2024-05-06 DIAGNOSIS — Z1231 Encounter for screening mammogram for malignant neoplasm of breast: Secondary | ICD-10-CM | POA: Insufficient documentation

## 2024-05-21 ENCOUNTER — Encounter: Payer: Self-pay | Admitting: Internal Medicine

## 2024-05-22 ENCOUNTER — Encounter: Payer: Self-pay | Admitting: Oncology

## 2024-05-23 ENCOUNTER — Encounter: Payer: Self-pay | Admitting: Oncology

## 2024-05-24 ENCOUNTER — Encounter: Payer: Self-pay | Admitting: Oncology

## 2024-05-30 ENCOUNTER — Ambulatory Visit: Payer: Self-pay

## 2024-05-30 VITALS — BP 129/61 | HR 72

## 2024-05-30 DIAGNOSIS — Z0000 Encounter for general adult medical examination without abnormal findings: Secondary | ICD-10-CM | POA: Diagnosis not present

## 2024-05-30 NOTE — Patient Instructions (Addendum)
 [name],  Thank you for taking the time for your Medicare Wellness Visit. I appreciate your continued commitment to your health goals. Please review the care plan we discussed, and feel free to reach out if I can assist you further.  Please note that Annual Wellness Visits do not include a physical exam. Some assessments may be limited, especially if the visit was conducted virtually. If needed, we may recommend an in-person follow-up with your provider.  Ongoing Care Seeing your primary care provider every 3 to 6 months helps us  monitor your health and provide consistent, personalized care. 07/26/24 @ 10:20 AM APPT W/ DR.[email or web address]  Referrals If a referral was made during today's visit and you haven't received any updates within two weeks, please contact the referred provider directly to check on the status.  Recommended Screenings:  Health Maintenance  Topic Date Due   COVID-19 Vaccine (5 - 2025-26 season) 01/15/2024   Osteoporosis screening with Bone Density Scan  04/27/2024   Medicare Annual Wellness Visit  05/24/2024   Breast Cancer Screening  05/06/2025   Colon Cancer Screening  11/05/2026   Pneumococcal Vaccine for age over 77  Completed   Flu Shot  Completed   Hepatitis C Screening  Completed   Zoster (Shingles) Vaccine  Completed   Meningitis B Vaccine  Aged Out   DTaP/Tdap/Td vaccine  Discontinued       05/30/2024   11:25 AM  Advanced Directives  Does Patient Have a Medical Advance Directive? Yes  Type of Estate Agent of [place];Living will  Does patient want to make changes to medical advance directive? No - Patient declined  Copy of Healthcare Power of Attorney in Chart? Yes - validated most recent copy scanned in chart (See row information)    Vision: Annual vision screenings are recommended for early detection of glaucoma, cataracts, and diabetic retinopathy. These exams can also reveal signs of chronic conditions such as diabetes and high  blood pressure.  Dental: Annual dental screenings help detect early signs of oral cancer, gum disease, and other conditions linked to overall health, including heart disease and diabetes.  Please see the attached documents for additional preventive care recommendations.   NEXT AWV 06/05/25 @ 10:50 AM BY VIDEO

## 2024-05-30 NOTE — Progress Notes (Signed)
 "  Chief Complaint  Patient presents with   Medicare Wellness     Subjective:   Shanyah Gattuso is a 79 y.o. female who presents for a Medicare Annual Wellness Visit.  Visit info / Clinical Intake: Medicare Wellness Visit Type:: Subsequent Annual Wellness Visit Persons participating in visit and providing information:: patient Medicare Wellness Visit Mode:: Video Since this visit was completed virtually, some vitals may be partially provided or unavailable. Missing vitals are due to the limitations of the virtual format.: Unable to obtain vitals - no equipment If Telephone or Video please confirm:: I connected with patient using audio/video enable telemedicine. I verified patient identity with two identifiers, discussed telehealth limitations, and patient agreed to proceed. Patient Location:: HOME Provider Location:: OFFICE Interpreter Needed?: No Pre-visit prep was completed: yes AWV questionnaire completed by patient prior to visit?: yes Date:: 05/27/24 Living arrangements:: lives with spouse/significant other Patient's Overall Health Status Rating: good Typical amount of pain: some Does pain affect daily life?: no Are you currently prescribed opioids?: no  Dietary Habits and Nutritional Risks How many meals a day?: 3 Eats fruit and vegetables daily?: (!) no Most meals are obtained by: preparing own meals In the last 2 weeks, have you had any of the following?: none Diabetic:: no  Functional Status Activities of Daily Living (to include ambulation/medication): Independent Ambulation: Independent Medication Administration: Independent Home Management (perform basic housework or laundry): Independent Manage your own finances?: yes Primary transportation is: driving Concerns about vision?: no *vision screening is required for WTM* ([facility]- DR.DINGELDEIN) Concerns about hearing?: no  Fall Screening Falls in the past year?: 0 Number of falls in past year:  0 Was there an injury with Fall?: 0 Fall Risk Category Calculator: 0 Patient Fall Risk Level: Low Fall Risk  Fall Risk Patient at Risk for Falls Due to: No Fall Risks Fall risk Follow up: Falls evaluation completed; Falls prevention discussed  Home and Transportation Safety: All rugs have non-skid backing?: (!) no All stairs or steps have railings?: yes Grab bars in the bathtub or shower?: yes Have non-skid surface in bathtub or shower?: yes Good home lighting?: yes Regular seat belt use?: yes Hospital stays in the last year:: no  Cognitive Assessment Difficulty concentrating, remembering, or making decisions? : yes (MEMORY) Will 6CIT or Mini Cog be Completed: yes What year is it?: 0 points What month is it?: 0 points Give patient an address phrase to remember (5 components): [address] [place], [place] About what time is it?: 0 points Count backwards from 20 to 1: 0 points Say the months of the year in reverse: 0 points Repeat the address phrase from earlier: 0 points 6 CIT Score: 0 points  Advance Directives (For Healthcare) Does Patient Have a Medical Advance Directive?: Yes Does patient want to make changes to medical advance directive?: No - Patient declined Type of Advance Directive: Healthcare Power of [place]; Living will Copy of Healthcare Power of Attorney in Chart?: Yes - validated most recent copy scanned in chart (See row information) Copy of Living Will in Chart?: Yes - validated most recent copy scanned in chart (See row information)  Reviewed/Updated  Reviewed/Updated: Reviewed All (Medical, Surgical, Family, Medications, Allergies, Care Teams, Patient Goals)    Allergies (verified) Lisinopril and Codeine   Current Medications (verified) Outpatient Encounter Medications as of 05/30/2024  Medication Sig   alendronate  (FOSAMAX ) 70 MG tablet TAKE 1 TABLET EVERY 7 DAYS WITH A FULL GLASS OF WATER  ON AN EMPTY STOMACH  cetirizine (ZYRTEC) 10 MG tablet  Take 10 mg by mouth daily. otc   Cholecalciferol (VITAMIN D -3) 125 MCG (5000 UT) TABS Take 1 capsule by mouth daily.   FIBER PO Take by mouth.   lactulose (CHRONULAC) 10 GM/15ML solution Take 10 g by mouth daily.   levothyroxine  (SYNTHROID ) 88 MCG tablet Take 1 tablet (88 mcg total) by mouth daily.   losartan (COZAAR) 100 MG tablet Take 100 mg by mouth daily.   lovastatin  (MEVACOR ) 20 MG tablet TAKE 1 TABLET AT BEDTIME   montelukast  (SINGULAIR ) 10 MG tablet TAKE 1 TABLET DAILY   magnesium gluconate (MAGONATE) 500 MG tablet Take 250 mg by mouth daily. (Patient not taking: Reported on 05/30/2024)   Omega-3 Fatty Acids (OMEGA 3 500 PO) Take by mouth daily. (Patient not taking: Reported on 05/30/2024)   No facility-administered encounter medications on file as of 05/30/2024.    History: Past Medical History:  Diagnosis Date   Age-related osteoporosis without current pathological fracture 09/09/2021   Allergy    Anemia 2000   estimate   Anemia    Anxiety    not bad   Blood transfusion without reported diagnosis 04/07/2017   estimate   Bradycardia    Bursitis    CKD (chronic kidney disease) stage 3, GFR 30-59 ml/min (HCC)    Depression    not bad   Diverticulosis    GERD (gastroesophageal reflux disease)    Hiatal hernia    Hypertension    Hypothyroidism    Migraine with aura    states she doesn't have a headache, just the aura   Mixed hyperlipidemia    Osteoarthritis of knee    Thyroid  disease    Past Surgical History:  Procedure Laterality Date   CHOLECYSTECTOMY     COLONOSCOPY  06/02/2014   cleared for 10 yrs- Dr Jeri   COLONOSCOPY N/A 04/07/2017   Procedure: COLONOSCOPY;  Surgeon: Unk Corinn Skiff, MD;  Location: [facility] ENDOSCOPY;  Service: Gastroenterology;  Laterality: N/A;   COLONOSCOPY WITH PROPOFOL  N/A 11/04/2021   Procedure: COLONOSCOPY WITH PROPOFOL ;  Surgeon: Therisa Bi, MD;  Location: [facility] ENDOSCOPY;  Service: Gastroenterology;  Laterality: N/A;   ESOPHAGEAL  DILATION     ESOPHAGEAL DILATION  10/21/2021   Procedure: ESOPHAGEAL DILATION;  Surgeon: Jinny Carmine, MD;  Location: [facility];  Service: Endoscopy;;  12-18 mm   ESOPHAGOGASTRODUODENOSCOPY N/A 04/07/2017   Procedure: ESOPHAGOGASTRODUODENOSCOPY (EGD);  Surgeon: Unk Corinn Skiff, MD;  Location: [facility] ENDOSCOPY;  Service: Gastroenterology;  Laterality: N/A;   ESOPHAGOGASTRODUODENOSCOPY N/A 10/21/2021   Procedure: ESOPHAGOGASTRODUODENOSCOPY (EGD);  Surgeon: Jinny Carmine, MD;  Location: [facility];  Service: Endoscopy;  Laterality: N/A;   HERNIA REPAIR  08/28/2014   estimate   TONSILLECTOMY     TUBAL LIGATION  1975   estimate   UPPER GI ENDOSCOPY  06/02/2014   Family History  Problem Relation Age of Onset   Kidney disease Mother    Cancer Mother        brain   Arthritis Mother    Obesity Mother    Varicose Veins Mother    Heart disease Father    Heart attack Father    Hearing loss Father    Hypertension Father    Lupus Sister    Diabetes Sister    Thyroid  disease Sister    Rheum arthritis Sister    Arthritis Sister    Obesity Sister    Kidney disease Sister    Diabetes Sister    Arthritis Sister  Cancer Sister    Hearing loss Sister    Intellectual disability Sister    Learning disabilities Sister    Multiple myeloma Sister    Thyroid  disease Sister    Rheum arthritis Sister    Arthritis Sister    Cancer Sister    Obesity Sister    Breast cancer Maternal Aunt    Miscarriages / Stillbirths Daughter    Social History   Occupational History   Occupation: Retired  Tobacco Use   Smoking status: Never   Smokeless tobacco: Never   Tobacco comments:    Never, but father and husband smoked  Vaping Use   Vaping status: Never Used  Substance and Sexual Activity   Alcohol use: Not Currently    Comment: on special occasions like New Years Eve   Drug use: Never   Sexual activity: Not Currently    Birth control/protection: Post-menopausal, Surgical     Comment: tubes tied in my 30's, now post-menopausal   Tobacco Counseling Counseling given: Not Answered Tobacco comments: Never, but father and husband smoked  SDOH Screenings   Food Insecurity: No Food Insecurity (05/30/2024)  Housing: Unknown (05/30/2024)  Transportation Needs: No Transportation Needs (05/30/2024)  Utilities: Not At Risk (05/30/2024)  Alcohol Screen: Low Risk (05/27/2024)  Depression (PHQ2-9): Low Risk (05/30/2024)  Financial Resource Strain: Low Risk (05/27/2024)  Physical Activity: Insufficiently Active (05/30/2024)  Social Connections: Moderately Integrated (05/30/2024)  Stress: Stress Concern Present (05/30/2024)  Tobacco Use: Low Risk (05/30/2024)  Health Literacy: Adequate Health Literacy (05/30/2024)   See flowsheets for full screening details  Depression Screen PHQ 2 & 9 Depression Scale- Over the past 2 weeks, how often have you been bothered by any of the following problems? Little interest or pleasure in doing things: 1 Feeling down, depressed, or hopeless (PHQ Adolescent also includes...irritable): 1 PHQ-2 Total Score: 2 Trouble falling or staying asleep, or sleeping too much: 0 Feeling tired or having little energy: 1 Poor appetite or overeating (PHQ Adolescent also includes...weight loss): 0 Feeling bad about yourself - or that you are a failure or have let yourself or your family down: 0 Trouble concentrating on things, such as reading the newspaper or watching television (PHQ Adolescent also includes...like school work): 0 Moving or speaking so slowly that other people could have noticed. Or the opposite - being so fidgety or restless that you have been moving around a lot more than usual: 0 Thoughts that you would be better off dead, or of hurting yourself in some way: 0 PHQ-9 Total Score: 3 If you checked off any problems, how difficult have these problems made it for you to do your work, take care of things at home, or get along with other people?: Not  difficult at all  Depression Treatment Depression Interventions/Treatment : EYV7-0 Score <4 Follow-up Not Indicated     Goals Addressed             This Visit's Progress    Cut out extra servings               Objective:    Today's Vitals   05/30/24 1134  BP: 129/61  Pulse: 72   There is no height or weight on file to calculate BMI.  Hearing/Vision screen Hearing Screening - Comments:: NO AIDS Vision Screening - Comments:: [facility]- DR.DINGELDEIN- YEARLY Immunizations and Health Maintenance Health Maintenance  Topic Date Due   COVID-19 Vaccine (5 - 2025-26 season) 01/15/2024   Bone Density Scan  04/27/2024   Mammogram  05/06/2025   Medicare Annual Wellness (AWV)  05/30/2025   Colonoscopy  11/05/2026   Pneumococcal Vaccine: 50+ Years  Completed   Influenza Vaccine  Completed   Hepatitis C Screening  Completed   Zoster Vaccines- Shingrix  Completed   Meningococcal B Vaccine  Aged Out   DTaP/Tdap/Td  Discontinued        Assessment/Plan:  This is a routine wellness examination for [place].  Patient Care Team: Bernardo Fend, DO as PCP - General (Internal Medicine) Damian Therisa HERO, MD as Physician Assistant (Endocrinology) Hester Wolm PARAS, MD as Consulting Physician (Cardiology) Unk Corinn Skiff, MD as Consulting Physician (Gastroenterology) Dingeldein, Elspeth, MD (Ophthalmology)  I have personally reviewed and noted the following in the patients chart:   Medical and social history Use of alcohol, tobacco or illicit drugs  Current medications and supplements including opioid prescriptions. Functional ability and status Nutritional status Physical activity Advanced directives List of other physicians Hospitalizations, surgeries, and ER visits in previous 12 months Vitals Screenings to include cognitive, depression, and falls Referrals and appointments  No orders of the defined types were placed in this encounter.  In addition, I have  reviewed and discussed with patient certain preventive protocols, quality metrics, and best practice recommendations. A written personalized care plan for preventive services as well as general preventive health recommendations were provided to patient.   Jhonnie GORMAN Das, LPN   [id number]/84/[id number]   Return in about 53 weeks (around 06/05/2025).  After Visit Summary: (MyChart) Due to this being a telephonic visit, the after visit summary with patients personalized plan was offered to patient via MyChart   Nurse Notes: NEEDS TDAP; UTD ON ON MAMMOGRAM, COLONOSCOPY; NEEDS BDS (WANTS TO WAIT UNTIL MAMMOGRAM NEXT YEAR TO HAVE THAT, NO REFERRAL PLACED)  No voiced or noted concerns at this time  "

## 2024-06-02 ENCOUNTER — Encounter: Payer: Self-pay | Admitting: Internal Medicine

## 2024-06-02 DIAGNOSIS — E782 Mixed hyperlipidemia: Secondary | ICD-10-CM

## 2024-06-02 DIAGNOSIS — E039 Hypothyroidism, unspecified: Secondary | ICD-10-CM

## 2024-06-02 DIAGNOSIS — J309 Allergic rhinitis, unspecified: Secondary | ICD-10-CM

## 2024-06-03 MED ORDER — LOVASTATIN 20 MG PO TABS: 20.0000 mg | ORAL_TABLET | Freq: Every day | ORAL | 0 refills | Status: AC

## 2024-06-03 MED ORDER — MONTELUKAST SODIUM 10 MG PO TABS: 10.0000 mg | ORAL_TABLET | Freq: Every day | ORAL | 0 refills | Status: AC

## 2024-06-03 MED ORDER — LEVOTHYROXINE SODIUM 88 MCG PO TABS: 88.0000 ug | ORAL_TABLET | Freq: Every day | ORAL | 0 refills | Status: AC

## 2024-07-26 ENCOUNTER — Encounter: Admitting: Internal Medicine

## 2025-06-05 ENCOUNTER — Ambulatory Visit
